# Patient Record
Sex: Male | Born: 1960 | Race: Black or African American | Hispanic: No | Marital: Single | Smoking: Current every day smoker
Health system: Southern US, Community
[De-identification: ages and names within clinical notes are randomized; demographics above are authoritative.]

## PROBLEM LIST (undated history)

## (undated) DIAGNOSIS — J449 Chronic obstructive pulmonary disease, unspecified: Secondary | ICD-10-CM

## (undated) DIAGNOSIS — F17200 Nicotine dependence, unspecified, uncomplicated: Secondary | ICD-10-CM

## (undated) DIAGNOSIS — W5501XA Bitten by cat, initial encounter: Secondary | ICD-10-CM

## (undated) DIAGNOSIS — S61452A Open bite of left hand, initial encounter: Secondary | ICD-10-CM

## (undated) HISTORY — PX: APPENDECTOMY: SHX54

---

## 2007-04-05 ENCOUNTER — Emergency Department (HOSPITAL_COMMUNITY): Admission: EM | Admit: 2007-04-05 | Discharge: 2007-04-05 | Payer: Self-pay | Admitting: Emergency Medicine

## 2014-04-16 ENCOUNTER — Encounter (HOSPITAL_COMMUNITY): Payer: Self-pay | Admitting: Family Medicine

## 2014-04-16 ENCOUNTER — Emergency Department (HOSPITAL_COMMUNITY)
Admission: EM | Admit: 2014-04-16 | Discharge: 2014-04-16 | Discharge status: Absent without leave | Payer: Self-pay | Source: Home / Self Care

## 2014-04-16 ENCOUNTER — Inpatient Hospital Stay (HOSPITAL_COMMUNITY)
Admission: EM | Admit: 2014-04-16 | Discharge: 2014-04-19 | DRG: 603 | Disposition: A | Discharge status: Home / Self Care | Payer: Self-pay | Attending: Oncology | Admitting: Oncology

## 2014-04-16 ENCOUNTER — Emergency Department (HOSPITAL_COMMUNITY): Payer: Self-pay

## 2014-04-16 DIAGNOSIS — I10 Essential (primary) hypertension: Secondary | ICD-10-CM | POA: Diagnosis present

## 2014-04-16 DIAGNOSIS — L03113 Cellulitis of right upper limb: Principal | ICD-10-CM | POA: Diagnosis present

## 2014-04-16 DIAGNOSIS — Z87891 Personal history of nicotine dependence: Secondary | ICD-10-CM

## 2014-04-16 DIAGNOSIS — S61452A Open bite of left hand, initial encounter: Secondary | ICD-10-CM | POA: Insufficient documentation

## 2014-04-16 DIAGNOSIS — I891 Lymphangitis: Secondary | ICD-10-CM

## 2014-04-16 DIAGNOSIS — W5501XA Bitten by cat, initial encounter: Secondary | ICD-10-CM | POA: Diagnosis present

## 2014-04-16 LAB — COMPREHENSIVE METABOLIC PANEL
ALK PHOS: 64 U/L (ref 39–117)
ALT: 13 U/L (ref 0–53)
AST: 16 U/L (ref 0–37)
Albumin: 4.3 g/dL (ref 3.5–5.2)
Anion gap: 7 (ref 5–15)
BUN: 16 mg/dL (ref 6–23)
CO2: 26 mmol/L (ref 19–32)
Calcium: 9.6 mg/dL (ref 8.4–10.5)
Chloride: 104 mmol/L (ref 96–112)
Creatinine, Ser: 1.1 mg/dL (ref 0.50–1.35)
GFR calc non Af Amer: 73 mL/min — ABNORMAL LOW (ref >90)
GFR, EST AFRICAN AMERICAN: 85 mL/min — AB (ref >90)
GLUCOSE: 101 mg/dL — AB (ref 70–99)
Potassium: 4.2 mmol/L (ref 3.5–5.1)
Sodium: 137 mmol/L (ref 135–145)
Total Bilirubin: 1 mg/dL (ref 0.3–1.2)
Total Protein: 7.3 g/dL (ref 6.0–8.3)

## 2014-04-16 LAB — CBC WITH DIFFERENTIAL/PLATELET
BASOS PCT: 0 % (ref 0–1)
Basophils Absolute: 0 10*3/uL (ref 0.0–0.1)
EOS ABS: 0.4 10*3/uL (ref 0.0–0.7)
Eosinophils Relative: 3 % (ref 0–5)
HCT: 39.6 % (ref 39.0–52.0)
Hemoglobin: 13.4 g/dL (ref 13.0–17.0)
Lymphocytes Relative: 11 % — ABNORMAL LOW (ref 12–46)
Lymphs Abs: 1.5 10*3/uL (ref 0.7–4.0)
MCH: 26.6 pg (ref 26.0–34.0)
MCHC: 33.8 g/dL (ref 30.0–36.0)
MCV: 78.6 fL (ref 78.0–100.0)
Monocytes Absolute: 0.8 10*3/uL (ref 0.1–1.0)
Monocytes Relative: 6 % (ref 3–12)
NEUTROS ABS: 10.5 10*3/uL — AB (ref 1.7–7.7)
NEUTROS PCT: 80 % — AB (ref 43–77)
PLATELETS: 172 10*3/uL (ref 150–400)
RBC: 5.04 MIL/uL (ref 4.22–5.81)
RDW: 13.5 % (ref 11.5–15.5)
WBC: 13.2 10*3/uL — ABNORMAL HIGH (ref 4.0–10.5)

## 2014-04-16 LAB — I-STAT CG4 LACTIC ACID, ED
Lactic Acid, Venous: 0.89 mmol/L (ref 0.5–2.0)
Lactic Acid, Venous: 1.65 mmol/L (ref 0.5–2.0)

## 2014-04-16 MED ORDER — ENOXAPARIN SODIUM 40 MG/0.4ML ~~LOC~~ SOLN
40.0000 mg | SUBCUTANEOUS | Status: DC
  Administered 2014-04-16 – 2014-04-18 (×3): 40 mg via SUBCUTANEOUS
  Filled 2014-04-16 (×4): qty 0.4

## 2014-04-16 MED ORDER — SODIUM CHLORIDE 0.9 % IV SOLN
3.0000 g | Freq: Four times a day (QID) | INTRAVENOUS | Status: DC
  Administered 2014-04-16 – 2014-04-19 (×12): 3 g via INTRAVENOUS
  Filled 2014-04-16 (×16): qty 3

## 2014-04-16 MED ORDER — HYDROCODONE-ACETAMINOPHEN 5-325 MG PO TABS
1.0000 | ORAL_TABLET | ORAL | Status: DC | PRN
  Administered 2014-04-16 – 2014-04-17 (×3): 2 via ORAL
  Administered 2014-04-18: 1 via ORAL
  Filled 2014-04-16 (×2): qty 2
  Filled 2014-04-16: qty 1
  Filled 2014-04-16: qty 2

## 2014-04-16 MED ORDER — IPRATROPIUM-ALBUTEROL 0.5-2.5 (3) MG/3ML IN SOLN: 3.0000 mL | Freq: Four times a day (QID) | RESPIRATORY_TRACT | Status: DC | PRN

## 2014-04-16 MED ORDER — TETANUS-DIPHTH-ACELL PERTUSSIS 5-2.5-18.5 LF-MCG/0.5 IM SUSP
0.5000 mL | Freq: Once | INTRAMUSCULAR | Status: AC
  Administered 2014-04-16: 0.5 mL via INTRAMUSCULAR
  Filled 2014-04-16: qty 0.5

## 2014-04-16 MED ORDER — VANCOMYCIN HCL 10 G IV SOLR
1250.0000 mg | Freq: Three times a day (TID) | INTRAVENOUS | Status: DC
  Administered 2014-04-16: 1250 mg via INTRAVENOUS
  Filled 2014-04-16 (×3): qty 1250

## 2014-04-16 MED ORDER — TETANUS IMMUNE GLOBULIN 250 UNIT/ML IM INJ
250.0000 [IU] | INJECTION | Freq: Once | INTRAMUSCULAR | Status: AC
  Administered 2014-04-16: 250 [IU] via INTRAMUSCULAR
  Filled 2014-04-16: qty 1

## 2014-04-16 NOTE — ED Provider Notes (Signed)
CSN: [id number]     Arrival date & time 04/16/14  1226 History   First MD Initiated Contact with Patient 04/16/14 1353     Chief Complaint  Patient presents with  . Animal Bite    HPI   54 year old male presents today after cat bite yesterday morning. 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 He reports the cat had been acting normal up until that point when it bit him on the right wrist . Cat is under observation at this point; unknown vaccination status of the cat. He reports today he woke up with redness swelling of the right wrist spreading up his forearm. Notes streaking up to his elbow. He denies an open wound, denies dizziness, shortness of breath, weakness, shoulder and elbow pain. Reports swelling and tenderness to the area of redness. No decreased strength and sensation or function of his hand. Patient denies any chronic health condition, reports that he does not have a primary care provider and has not seen one recently. Really not taking any medications. Denies fever. Patient reports that he currently lives at rehabilitation facility for alcohol and drug abuse, but he denies IV drug use. No current alcohol or drug abuse at this time.   History reviewed. No pertinent past medical history. History reviewed. No pertinent past surgical history. History reviewed. No pertinent family history. History  Substance Use Topics  . Smoking status: Former Research scientist (life sciences)  . Smokeless tobacco: Not on file  . Alcohol Use: No    Review of Systems  All other systems reviewed and are negative.   Allergies  Review of patient's allergies indicates no known allergies.  Home Medications   Prior to Admission medications   Not on File   BP 171/100 mmHg  Pulse 120  Temp(Src) 98.8 F (37.1 C) (Oral)  Resp 18  Ht 5\' 8"  (1.727 m)  Wt 185 lb 6 oz (84.086 kg)  BMI 28.19 kg/m2  SpO2 95% Physical Exam  Constitutional: He is oriented to person, place, and time. He appears  well-developed and well-nourished.  HENT:  Head: Normocephalic and atraumatic.  Eyes: Pupils are equal, round, and reactive to light.  Neck: Normal range of motion. Neck supple. No JVD present. No tracheal deviation present. No thyromegaly present.  Cardiovascular: Normal rate, regular rhythm, normal heart sounds and intact distal pulses.  Exam reveals no gallop and no friction rub.   No murmur heard. Pulmonary/Chest: Effort normal and breath sounds normal. No stridor. No respiratory distress. He has no wheezes. He has no rales. He exhibits no tenderness.  Musculoskeletal: Normal range of motion.  Erythema, swelling, tenderness to the proximal hand and distal wrist circumferentially. Streaking extending to the elbow.   Lymphadenopathy:    He has no cervical adenopathy.  Neurological: He is alert and oriented to person, place, and time. Coordination normal.  Skin: Skin is warm and dry.  Psychiatric: He has a normal mood and affect. His behavior is normal. Judgment and thought content normal.  Nursing note and vitals reviewed.   ED Course  Procedures (including critical care time) Labs Review Labs Reviewed  CBC WITH DIFFERENTIAL/PLATELET - Abnormal; Notable for the following:    WBC 13.2 (*)    Neutrophils Relative % 80 (*)    Neutro Abs 10.5 (*)    Lymphocytes Relative 11 (*)    All other components within normal limits  COMPREHENSIVE METABOLIC PANEL - Abnormal; Notable for the following:    Glucose, Bld 101 (*)  GFR calc non Af Amer 73 (*)    GFR calc Af Amer 85 (*)    All other components within normal limits    Imaging Review No results found.   EKG Interpretation None     MDM   Final diagnoses:  Lymphangitis  Cat bite, initial encounter    Labs: CBC 13.2 WBC, CMP glucose 101,   Imaging: Dg wrist right no radiopaque foreign body   Consults: Teaching service  Therapeutics: Unasyn 3G, Vancocin 1,250 mg, T dap  Assessment: cellulitis    Plan: Patient's Bite  is obviously infected with rapid progression of his arm as evidenced by streaking to his elbow and hand. Patient's history significant for drug and alcohol abuse he currently lives at a rehabilitation center and has no outpatient primary care to follow up with. Concerns for rapidly progressing infection were high with evidence of lymphangitis, teaching service was consult and agreed to admit patient. Patient's vital signs remained normal throughout stay. CBG was read at 101 making the concern for uncontrolled diabetes unlikely. Internal medicine teaching services consultation and agreed to admit patient.      Okey Regal, PA-C 04/16/14 1801  Ezequiel Essex, MD 04/16/14 6659

## 2014-04-16 NOTE — ED Notes (Signed)
Pt sts he was cleaning a cat at the [employer] yesterday and it bit him. Pt has bite to right wrist area. Area red, swollen and streaking up right arm.

## 2014-04-16 NOTE — ED Notes (Signed)
Admitting Doctor at bedside 

## 2014-04-16 NOTE — ED Notes (Signed)
Report given to 5W nurse

## 2014-04-16 NOTE — H&P (Signed)
Date: 04/16/2014               Patient Name:  Christian Newton MRN: [id number]  DOB: 03/17/1960 Age / Sex: 54 y.o., male   PCP: No primary care provider on file.         Medical Service: Internal Medicine Teaching Service         Attending Physician: Dr. Oval Linsey, MD    First Contact: Dr. Marvel Plan Pager: [phone]  Second Contact: Dr. Denton Brick Pager: [phone]       After Hours (After 5p/  First Contact Pager: [phone]  weekends / holidays): Second Contact Pager: [phone]   Chief Complaint: hand swelling and pain  History of Present Illness: Christian Newton is a 54 yo male with PMHx of alcohol abuse who presents to the ED with complaint of right handed redness, swelling and pain. Patient states he works at an [employer] and yesterday he was cleaning a cat's cage when the cat bit his right medial wrist. Last night, patient noticed swelling and some pain, but washed the wound and went to sleep. The following morning, patient noticed increased swelling, increased tenderness, and extension of redness from wrist and hand up his right arm. Patient went to work and upon showing his co-workers, they instructed him to go the ED. Patient admits to pain over the medial right wrist, stiffness in wrist and fingers, and swelling. He denies fevers, chills, nausea, vomiting or diarrhea. He denies confusion or dizziness. Patient states the cat was detained for observation as its vaccination status is unknown. Patient has never had a tetanus shot.   Patient lives at rehab facility and admits to tobacco use 3/4 ppd but denies alcohol or illicit drug use. He denies any PMHx and states he only uses advil and tylenol at home.  Meds: Prescriptions prior to admission  Medication Sig Dispense Refill Last Dose  . acetaminophen (TYLENOL) 500 MG tablet Take 500 mg by mouth every 6 (six) hours as needed.   04/16/2014 at Unknown time  . ibuprofen (ADVIL,MOTRIN) 200 MG tablet Take 200 mg by mouth every 6 (six) hours as  needed.   04/15/2014 at Unknown time    Current Facility-Administered Medications  Medication Dose Route Frequency Provider Last Rate Last Dose  . Ampicillin-Sulbactam (UNASYN) 3 g in sodium chloride 0.9 % 100 mL IVPB  3 g Intravenous Q6H Okey Regal, PA-C   Stopped at 04/16/14 1650  . enoxaparin (LOVENOX) injection 40 mg  40 mg Subcutaneous Q24H Ejiroghene E Emokpae, MD      . HYDROcodone-acetaminophen (NORCO/VICODIN) 5-325 MG per tablet 1-2 tablet  1-2 tablet Oral Q4H PRN Bethena Roys, MD   2 tablet at 04/16/14 1914  . ipratropium-albuterol (DUONEB) 0.5-2.5 (3) MG/3ML nebulizer solution 3 mL  3 mL Nebulization Q6H PRN Jaymen Fetch Sherral Hammers, MD        Allergies: Allergies as of 04/16/2014  . (No Known Allergies)   History reviewed. No pertinent past medical history. History reviewed. No pertinent past surgical history. History reviewed. No pertinent family history. History   Social History  . Marital Status: Single    Spouse Name: N/A  . Number of Children: N/A  . Years of Education: N/A   Occupational History  . Not on file.   Social History Main Topics  . Smoking status: Former Research scientist (life sciences)  . Smokeless tobacco: Not on file  . Alcohol Use: No  . Drug Use: No  . Sexual Activity: Not on file   Other Topics  Concern  . Not on file   Social History Narrative  . No narrative on file    Review of Systems: General: Denies fever, chills  Respiratory: Admits to wheeze. Denies SOB, cough, DOE, chest tightness  Cardiovascular: Denies chest pain and palpitations.  Gastrointestinal: Denies nausea, vomiting, abdominal pain, diarrhea, constipation  Musculoskeletal: Admits to right wrist pain, swelling and redness. Stiffness of fingers and wrist. Neurological: Denies dizziness, headaches, weakness, lightheadedness, numbness  Physical Exam: Filed Vitals:   04/16/14 1430 04/16/14 1615 04/16/14 1745 04/16/14 1840  BP: 160/84 158/96 172/102 157/104  Pulse: 101 102 102 118    Temp:   100.4 F (38 C) 100.3 F (37.9 C)  TempSrc:   Oral Oral  Resp: 16 16 18 18   Height:    5\' 8"  (1.727 m)  Weight:    185 lb 6 oz (84.086 kg)  SpO2: 98% 98% 97% 97%   General: Vital signs reviewed.  Patient is well-developed and well-nourished, in no acute distress and cooperative with exam.  Cardiovascular: Tachycardic, regular rhythm, S1 normal, S2 normal, no murmurs, gallops, or rubs. Pulmonary/Chest: Scattered expiratory wheezes, no rhonchi or rales. Abdominal: Soft, non-tender, distended, BS +, no masses, organomegaly, or guarding present.  Extremities: No lower extremity edema bilaterally. Skin: Erythematous, edematous, and tender right medial wrist with extension of edema and erythema extending to hand and proximally up right arm. Stiffness in fingers, but normal strength, normal pulses and sensation Psychiatric: Normal mood and affect. speech and behavior is normal. Cognition and memory are normal.         Lab results: Basic Metabolic Panel:  Recent Labs  04/16/14 1259  NA 137  K 4.2  CL 104  CO2 26  GLUCOSE 101*  BUN 16  CREATININE 1.10  CALCIUM 9.6   Liver Function Tests:  Recent Labs  04/16/14 1259  AST 16  ALT 13  ALKPHOS 64  BILITOT 1.0  PROT 7.3  ALBUMIN 4.3   CBC:  Recent Labs  04/16/14 1259  WBC 13.2*  NEUTROABS 10.5*  HGB 13.4  HCT 39.6  MCV 78.6  PLT 172   Imaging results:  Dg Wrist Complete Right  04/16/2014   CLINICAL DATA:  Pain following cat bite 1 day prior  EXAM: RIGHT WRIST - COMPLETE 3+ VIEW  COMPARISON:  None.  FINDINGS: Frontal, oblique, ulnar deviation scaphoid, and lateral views were obtained. There is no fracture or dislocation. No erosive change or bony destruction. There are subchondral cysts in the distal scaphoid. No erosive change. No radiopaque foreign body or soft tissue abscess appreciable.  IMPRESSION: Small subchondral cysts in distal scaphoid. No fracture or dislocation. No erosive change or bony  destruction. No radiopaque foreign body or soft tissue abscess appreciable.   Electronically Signed   By: Lowella Grip III M.D.   On: 04/16/2014 14:45   Assessment & Plan by Problem: Principal Problem:   Cellulitis  RUE Cellulitis: Secondary to cat bite that occurred the day prior to admission. Vitals signs on admission showed the patient was afebrile at 98.8, hypertensive at 171/100, tachycardic at 120, RR 18, 95% on room air. Labs revealed leukocytosis of 13.2 and normal 0.89. Wrist xray showed no fracture or dislocation, no erosive change or bony destruction, and no radiopaque foreign body or soft tissue abscess appreciable. Given limited social support and rapid spread of infection, patient will be admitted for IV antibiotics. We are covering for anaerobic, aerobic, staph and strep. Most likely pathogens include pasteurella, bacteroides, fusobacteria. We will  treat with Unasyn. We will also monitor for signs of lymphangitis, osteomyelitis, tenosynovitis, and septic arthritis. Patient will need treatment for 5-10 days with consideration of transitioning to Augmentin 875/125 tomorrow.  -Unasyn 3 grams IV Q6H  -Norco 5-325 mg 1-2 tabs Q4H prn -Tdap -Tetanus Ig -CBC tomorrow am -Heart healthy diet -HgbA1c  -HIV antibody  Wheeze: Scattered expiratory wheezes on exam. Patient denies history of asthma. Denies shortness of breath. -Duoneb Q6H prn  DVT/PE ppx: Lovenox SQ QD  FEN: Heart Healthy  CODE: FULL  Dispo: Disposition is deferred at this time, awaiting improvement of current medical problems. Anticipated discharge in approximately 1-2 day(s).   The patient does not have a current PCP (No primary care provider on file.) and does need an [facility] hospital follow-up appointment after discharge.  The patient does not have transportation limitations that hinder transportation to clinic appointments.  Signed: Osa Craver, DO PGY-1 Internal Medicine Resident Pager #  [phone] 04/16/2014 7:33 PM

## 2014-04-16 NOTE — Progress Notes (Signed)
Pt admitted to the unit at [facility]. Pt mental status is A&OX4. Pt oriented to room, staff, and call bell. Skin is intact other than cellulitis to R wrist and scab to L shin. Full assessment charted in [facility]. Call bell within reach. Visitor guidelines reviewed w/ pt and/or family.

## 2014-04-17 DIAGNOSIS — L03113 Cellulitis of right upper limb: Principal | ICD-10-CM

## 2014-04-17 DIAGNOSIS — W5501XA Bitten by cat, initial encounter: Secondary | ICD-10-CM

## 2014-04-17 LAB — CBC
HCT: 39.9 % (ref 39.0–52.0)
HEMOGLOBIN: 13.3 g/dL (ref 13.0–17.0)
MCH: 26.4 pg (ref 26.0–34.0)
MCHC: 33.3 g/dL (ref 30.0–36.0)
MCV: 79.2 fL (ref 78.0–100.0)
Platelets: 169 10*3/uL (ref 150–400)
RBC: 5.04 MIL/uL (ref 4.22–5.81)
RDW: 13.7 % (ref 11.5–15.5)
WBC: 11.1 10*3/uL — ABNORMAL HIGH (ref 4.0–10.5)

## 2014-04-17 LAB — HIV ANTIBODY (ROUTINE TESTING W REFLEX): HIV Screen 4th Generation wRfx: NONREACTIVE

## 2014-04-17 MED ORDER — VANCOMYCIN HCL IN DEXTROSE 1-5 GM/200ML-% IV SOLN
1000.0000 mg | Freq: Two times a day (BID) | INTRAVENOUS | Status: DC
  Administered 2014-04-17 – 2014-04-19 (×5): 1000 mg via INTRAVENOUS
  Filled 2014-04-17 (×6): qty 200

## 2014-04-17 NOTE — Progress Notes (Signed)
Subjective:  Patient was seen and examined this morning. He continues to have right wrist swelling and tenderness. He denies any associated symptoms of fever, chills, nausea or vomiting.   Objective: Vital signs in last 24 hours: Filed Vitals:   04/16/14 1840 04/16/14 1930 04/16/14 2152 04/17/14 0502  BP: 157/104 148/84 129/86 140/89  Pulse: 118  110 104  Temp: 100.3 F (37.9 C)  99.3 F (37.4 C) 99.2 F (37.3 C)  TempSrc: Oral  Oral Oral  Resp: 18  20 16   Height: 5\' 8"  (1.727 m)     Weight: 185 lb 6 oz (84.086 kg)     SpO2: 97%  94% 94%   Weight change:   Intake/Output Summary (Last 24 hours) at 04/17/14 1146 Last data filed at 04/17/14 0918  Gross per 24 hour  Intake    560 ml  Output    450 ml  Net    110 ml   General: Vital signs reviewed. Patient is well-developed and well-nourished, in no acute distress and cooperative with exam.  Cardiovascular: Tachycardic, regular rhythm, S1 normal, S2 normal, no murmurs, gallops, or rubs. Pulmonary/Chest: CTA b/l, no WRR auscultated on lung exam Abdominal: Soft, non-tender, distended, BS +, no masses, organomegaly, or guarding present.  Extremities: No lower extremity edema bilaterally. Skin: Erythematous, edematous, and tender right medial wrist with 2 cm extension of edema and erythema proximally beyond marker. Normal strength, normal pulses and sensation. Psychiatric: Normal mood and affect. speech and behavior is normal. Cognition and memory are normal.   Lab Results: Basic Metabolic Panel:  Recent Labs Lab 04/16/14 1259  NA 137  K 4.2  CL 104  CO2 26  GLUCOSE 101*  BUN 16  CREATININE 1.10  CALCIUM 9.6   Liver Function Tests:  Recent Labs Lab 04/16/14 1259  AST 16  ALT 13  ALKPHOS 64  BILITOT 1.0  PROT 7.3  ALBUMIN 4.3   CBC:  Recent Labs Lab 04/16/14 1259 04/17/14 0508  WBC 13.2* 11.1*  NEUTROABS 10.5*  --   HGB 13.4 13.3  HCT 39.6 39.9  MCV 78.6 79.2  PLT 172 169   Micro  Results: Recent Results (from the past 240 hour(s))  Culture, blood (routine x 2)     Status: None (Preliminary result)   Collection Time: 04/16/14  2:55 PM  Result Value Ref Range Status   Specimen Description BLOOD LEFT ANTECUBITAL  Final   Special Requests BOTTLES DRAWN AEROBIC AND ANAEROBIC 5CCS  Final   Culture   Final           BLOOD CULTURE RECEIVED NO GROWTH TO DATE CULTURE WILL BE HELD FOR 5 DAYS BEFORE ISSUING A FINAL NEGATIVE REPORT Performed at [facility]    Report Status PENDING  Incomplete  Culture, blood (routine x 2)     Status: None (Preliminary result)   Collection Time: 04/16/14  3:05 PM  Result Value Ref Range Status   Specimen Description BLOOD LEFT HAND  Final   Special Requests BOTTLES DRAWN AEROBIC AND ANAEROBIC 5CCS  Final   Culture   Final           BLOOD CULTURE RECEIVED NO GROWTH TO DATE CULTURE WILL BE HELD FOR 5 DAYS BEFORE ISSUING A FINAL NEGATIVE REPORT Performed at [facility]    Report Status PENDING  Incomplete   Studies/Results: Dg Wrist Complete Right  04/16/2014   CLINICAL DATA:  Pain following cat bite 1 day prior  EXAM: RIGHT WRIST -  COMPLETE 3+ VIEW  COMPARISON:  None.  FINDINGS: Frontal, oblique, ulnar deviation scaphoid, and lateral views were obtained. There is no fracture or dislocation. No erosive change or bony destruction. There are subchondral cysts in the distal scaphoid. No erosive change. No radiopaque foreign body or soft tissue abscess appreciable.  IMPRESSION: Small subchondral cysts in distal scaphoid. No fracture or dislocation. No erosive change or bony destruction. No radiopaque foreign body or soft tissue abscess appreciable.   Electronically Signed   By: Lowella Grip III M.D.   On: 04/16/2014 14:45   Medications:  I have reviewed the patient's current medications. Prior to Admission:  Prescriptions prior to admission  Medication Sig Dispense Refill Last Dose  . acetaminophen (TYLENOL) 500 MG tablet  Take 500 mg by mouth every 6 (six) hours as needed.   04/16/2014 at Unknown time  . ibuprofen (ADVIL,MOTRIN) 200 MG tablet Take 200 mg by mouth every 6 (six) hours as needed.   04/15/2014 at Unknown time   Scheduled Meds: . ampicillin-sulbactam (UNASYN) IV  3 g Intravenous Q6H  . enoxaparin (LOVENOX) injection  40 mg Subcutaneous Q24H  . vancomycin  1,000 mg Intravenous Q12H   Continuous Infusions:  PRN Meds:.HYDROcodone-acetaminophen, ipratropium-albuterol Assessment/Plan: Principal Problem:   Cellulitis  RUE Cellulitis: Secondary to cat bite that occurred the day prior to admission. Patient continues to have erythema, edema, and tenderness with proximal extension of erythema 2 cm beyond marked border last night. Patient remains afebrile, leukocytosis 13.2>11, and lactic acid remains normal at 0.89>1.65. We will continue to treat with Unasyn and add Vancomycin.  -Unasyn 3 grams IV Q6H  -Vancomycin 1000 mg IV BID -Consider transitioning to Augmentin 875/125 on discharge for a total of 5-10 days of antibiotics -Norco 5-325 mg 1-2 tabs Q4H prn -CBC tomorrow am -Heart healthy diet -HgbA1c pending -HIV pending  DVT/PE ppx: Lovenox SQ QD  FEN: Heart Healthy  CODE: FULL  .Services Needed at time of discharge: Y = Yes, Blank = No PT:   OT:   RN:   Equipment:   Other:     LOS: 1 day   Osa Craver, DO PGY-1 Internal Medicine Resident Pager # [phone] 04/17/2014 11:46 AM

## 2014-04-17 NOTE — Progress Notes (Signed)
ANTIBIOTIC CONSULT NOTE - INITIAL  Pharmacy Consult for Vancomycin  Indication: Cellulits secondary to cat bite  No Known Allergies  Patient Measurements: Height: 5\' 8"  (172.7 cm) Weight: 185 lb 6 oz (84.086 kg) IBW/kg (Calculated) : 68.4  Vital Signs: Temp: 99.2 F (37.3 C) (03/26 0502) Temp Source: Oral (03/26 0502) BP: 140/89 mmHg (03/26 0502) Pulse Rate: 104 (03/26 0502) Intake/Output from previous day: 03/25 0701 - 03/26 0700 In: 440 [P.O.:240; IV Piggyback:200] Out: 450 [Urine:450] Intake/Output from this shift: Total I/O In: 120 [P.O.:120] Out: 0   Labs:  Recent Labs  04/16/14 1259 04/17/14 0508  WBC 13.2* 11.1*  HGB 13.4 13.3  PLT 172 169  CREATININE 1.10  --    Estimated Creatinine Clearance: 79.2 mL/min (by C-G formula based on Cr of 1.1). No results for input(s): VANCOTROUGH, VANCOPEAK, VANCORANDOM, GENTTROUGH, GENTPEAK, GENTRANDOM, TOBRATROUGH, TOBRAPEAK, TOBRARND, AMIKACINPEAK, AMIKACINTROU, AMIKACIN in the last 72 hours.   Microbiology: Recent Results (from the past 720 hour(s))  Culture, blood (routine x 2)     Status: None (Preliminary result)   Collection Time: 04/16/14  2:55 PM  Result Value Ref Range Status   Specimen Description BLOOD LEFT ANTECUBITAL  Final   Special Requests BOTTLES DRAWN AEROBIC AND ANAEROBIC 5CCS  Final   Culture   Final           BLOOD CULTURE RECEIVED NO GROWTH TO DATE CULTURE WILL BE HELD FOR 5 DAYS BEFORE ISSUING A FINAL NEGATIVE REPORT Performed at [facility]    Report Status PENDING  Incomplete  Culture, blood (routine x 2)     Status: None (Preliminary result)   Collection Time: 04/16/14  3:05 PM  Result Value Ref Range Status   Specimen Description BLOOD LEFT HAND  Final   Special Requests BOTTLES DRAWN AEROBIC AND ANAEROBIC 5CCS  Final   Culture   Final           BLOOD CULTURE RECEIVED NO GROWTH TO DATE CULTURE WILL BE HELD FOR 5 DAYS BEFORE ISSUING A FINAL NEGATIVE REPORT Performed at [facility]    Report Status PENDING  Incomplete    Medical History: History reviewed. No pertinent past medical history.  Medications:  Prescriptions prior to admission  Medication Sig Dispense Refill Last Dose  . acetaminophen (TYLENOL) 500 MG tablet Take 500 mg by mouth every 6 (six) hours as needed.   04/16/2014 at Unknown time  . ibuprofen (ADVIL,MOTRIN) 200 MG tablet Take 200 mg by mouth every 6 (six) hours as needed.   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  Admitted 04/16/2014  Due to swelling, increased tenderness, and redness extending up right arm.  Pharmacy consulted to dose vancomycin.  ID: cellulitis s/p cat bite, wbc remains elevated but trend down, afeb, tachycardic Unasyn 3/25>> Vanc 3/26>>  Goal of Therapy:  Vancomycin trough level 10-15 mcg/ml  Plan:  Vancomycin 1g IV q12h Follow up SCr, UOP, cultures, clinical course and adjust as clinically indicated.   Thank you for allowing pharmacy to be a part of this patients care team.  Rowe Robert Pharm.D., BCPS, AQ-Cardiology Clinical Pharmacist 04/17/2014 9:22 AM Pager: [phone] Phone: [phone]

## 2014-04-17 NOTE — Progress Notes (Signed)
UR completed 

## 2014-04-18 LAB — CBC
HEMATOCRIT: 39 % (ref 39.0–52.0)
Hemoglobin: 13.3 g/dL (ref 13.0–17.0)
MCH: 26.8 pg (ref 26.0–34.0)
MCHC: 34.1 g/dL (ref 30.0–36.0)
MCV: 78.5 fL (ref 78.0–100.0)
Platelets: 162 10*3/uL (ref 150–400)
RBC: 4.97 MIL/uL (ref 4.22–5.81)
RDW: 13.5 % (ref 11.5–15.5)
WBC: 9.1 10*3/uL (ref 4.0–10.5)

## 2014-04-18 LAB — GLUCOSE, CAPILLARY: GLUCOSE-CAPILLARY: 120 mg/dL — AB (ref 70–99)

## 2014-04-18 NOTE — Progress Notes (Addendum)
Subjective: Patient says the swelling is about the same today as he was yesterday, pain is about the same. Denies fever or chills. No SOB, no chest pain.  Objective: Vital signs in last 24 hours: Filed Vitals:   04/17/14 0502 04/17/14 1408 04/17/14 2244 04/18/14 0540  BP: 140/89 151/93 138/82 144/85  Pulse: 104 100 100 101  Temp: 99.2 F (37.3 C) 99.6 F (37.6 C) 99.3 F (37.4 C) 98.7 F (37.1 C)  TempSrc: Oral Oral Oral Oral  Resp: 16 20 18 18   Height:      Weight:      SpO2: 94% 96% 95% 93%   Weight change:   Intake/Output Summary (Last 24 hours) at 04/18/14 1035 Last data filed at 04/18/14 0919  Gross per 24 hour  Intake   1640 ml  Output   1040 ml  Net    600 ml   General: Vital signs reviewed. Patient is well-developed and well-nourished, in no acute distress and cooperative with exam.  Cardiovascular: Tachycardic, regular rhythm, S1 normal, S2 normal, no murmurs, gallops, or rubs. Pulmonary/Chest: No added sounds Abdominal: Soft, non-tender, distended, BS +, no masses, organomegaly, or guarding present.  Extremities: No lower extremity edema bilaterally. Skin: Swelling has improved, but here is still slight extension of redness beyound marker. Normal strength, normal pulses and sensation, range of motion of wrist- right slightly limited by pain.  Psychiatric: Normal mood and affect. speech and behavior is normal. Cognition and memory are normal.   Lab Results: Basic Metabolic Panel:  Recent Labs Lab 04/16/14 1259  NA 137  K 4.2  CL 104  CO2 26  GLUCOSE 101*  BUN 16  CREATININE 1.10  CALCIUM 9.6   Liver Function Tests:  Recent Labs Lab 04/16/14 1259  AST 16  ALT 13  ALKPHOS 64  BILITOT 1.0  PROT 7.3  ALBUMIN 4.3   CBC:  Recent Labs Lab 04/16/14 1259 04/17/14 0508 04/18/14 0800  WBC 13.2* 11.1* 9.1  NEUTROABS 10.5*  --   --   HGB 13.4 13.3 13.3  HCT 39.6 39.9 39.0  MCV 78.6 79.2 78.5  PLT 172 169 162   Micro Results: Recent  Results (from the past 240 hour(s))  Culture, blood (routine x 2)     Status: None (Preliminary result)   Collection Time: 04/16/14  2:55 PM  Result Value Ref Range Status   Specimen Description BLOOD LEFT ANTECUBITAL  Final   Special Requests BOTTLES DRAWN AEROBIC AND ANAEROBIC 5CCS  Final   Culture   Final           BLOOD CULTURE RECEIVED NO GROWTH TO DATE CULTURE WILL BE HELD FOR 5 DAYS BEFORE ISSUING A FINAL NEGATIVE REPORT Performed at [facility]    Report Status PENDING  Incomplete  Culture, blood (routine x 2)     Status: None (Preliminary result)   Collection Time: 04/16/14  3:05 PM  Result Value Ref Range Status   Specimen Description BLOOD LEFT HAND  Final   Special Requests BOTTLES DRAWN AEROBIC AND ANAEROBIC 5CCS  Final   Culture   Final           BLOOD CULTURE RECEIVED NO GROWTH TO DATE CULTURE WILL BE HELD FOR 5 DAYS BEFORE ISSUING A FINAL NEGATIVE REPORT Performed at [facility]    Report Status PENDING  Incomplete   Studies/Results: Dg Wrist Complete Right  04/16/2014   CLINICAL DATA:  Pain following cat bite 1 day prior  EXAM: RIGHT  WRIST - COMPLETE 3+ VIEW  COMPARISON:  None.  FINDINGS: Frontal, oblique, ulnar deviation scaphoid, and lateral views were obtained. There is no fracture or dislocation. No erosive change or bony destruction. There are subchondral cysts in the distal scaphoid. No erosive change. No radiopaque foreign body or soft tissue abscess appreciable.  IMPRESSION: Small subchondral cysts in distal scaphoid. No fracture or dislocation. No erosive change or bony destruction. No radiopaque foreign body or soft tissue abscess appreciable.   Electronically Signed   By: Lowella Grip III M.D.   On: 04/16/2014 14:45   Medications:  I have reviewed the patient's current medications. Prior to Admission:  Prescriptions prior to admission  Medication Sig Dispense Refill Last Dose  . acetaminophen (TYLENOL) 500 MG tablet Take 500 mg by  mouth every 6 (six) hours as needed.   04/16/2014 at Unknown time  . ibuprofen (ADVIL,MOTRIN) 200 MG tablet Take 200 mg by mouth every 6 (six) hours as needed.   04/15/2014 at Unknown time   Scheduled Meds: . ampicillin-sulbactam (UNASYN) IV  3 g Intravenous Q6H  . enoxaparin (LOVENOX) injection  40 mg Subcutaneous Q24H  . vancomycin  1,000 mg Intravenous Q12H   Continuous Infusions:  PRN Meds:.HYDROcodone-acetaminophen, ipratropium-albuterol Assessment/Plan: Principal Problem:   Cellulitis Active Problems:   Cat bite  RUE Cellulitis: Appears the same compared to yesterday. On Unasyn and Vanc. Secondary to cat bite that occurred the day prior to admission. Patient continues to have erythema, edema, and tenderness with proximal extension of erythema 2 cm beyond marked border last night. Patient remains afebrile, leukocytosis 13.2>11,> 9.1. Will continue to treat with Unasyn and add Vancomycin.  -Unasyn 3 grams IV Q6H  -Vancomycin 1000 mg IV BID- switch to doxycycline -Consider transitioning to Augmentin 875/125 on discharge for a total of 5-10 days of antibiotics -Norco 5-325 mg 1-2 tabs Q4H prn -Heart healthy diet -HgbA1c - pending -HIV - negative  DVT/PE ppx: Lovenox SQ QD  FEN: Heart Healthy  CODE: FULL  .Services Needed at time of discharge: Y = Yes, Blank = No PT:   OT:   RN:   Equipment:   Other:     LOS: 2 days   Bing Neighbors PGY-2 Internal Medicine Resident Pager # [phone] 04/18/2014 10:35 AM  Internal Medicine Attending  Date: 04/18/2014  Patient name: Christian Newton Medical record number: [id number] Date of birth: 29-Jun-1960 Age: 54 y.o. Gender: male  I discussed Christian Newton case with the housestaff and reviewed the resident's note by Dr. Denton Brick and I agree with the resident's findings and plans as documented in her progress note.  We will continue the IV Unasyn and vancomycin and reassess the cellulitis in the morning.

## 2014-04-19 LAB — HEMOGLOBIN A1C
Hgb A1c MFr Bld: 6.1 % — ABNORMAL HIGH (ref 4.8–5.6)
Mean Plasma Glucose: 128 mg/dL

## 2014-04-19 MED ORDER — SULFAMETHOXAZOLE-TRIMETHOPRIM 800-160 MG PO TABS: 1.0000 | ORAL_TABLET | Freq: Two times a day (BID) | ORAL | Status: DC

## 2014-04-19 MED ORDER — METRONIDAZOLE 500 MG PO TABS: 500.0000 mg | ORAL_TABLET | Freq: Three times a day (TID) | ORAL | Status: DC

## 2014-04-19 NOTE — Discharge Summary (Signed)
Name: Christian Newton MRN: [id number] DOB: Jun 02, 1960 54 y.o. PCP: No primary care provider on file.  Date of Admission: 04/16/2014  1:47 PM Date of Discharge: 04/19/2014 Attending Physician: No att. providers found  Discharge Diagnosis:  Principal Problem:   Cellulitis Active Problems:   Cat bite  Discharge Medications:   Medication List    TAKE these medications        acetaminophen 500 MG tablet  Commonly known as:  TYLENOL  Take 500 mg by mouth every 6 (six) hours as needed.     ibuprofen 200 MG tablet  Commonly known as:  ADVIL,MOTRIN  Take 200 mg by mouth every 6 (six) hours as needed.     metroNIDAZOLE 500 MG tablet  Commonly known as:  FLAGYL  Take 1 tablet (500 mg total) by mouth 3 (three) times daily.     sulfamethoxazole-trimethoprim 800-160 MG per tablet  Commonly known as:  SEPTRA DS  Take 1 tablet by mouth 2 (two) times daily.        Disposition and follow-up:   Mr.Christian Newton was discharged from [facility] in Good condition.  At the hospital follow up visit please address:  1.  Cellulitis secondary to Cat Bite: Please assess resolution of cellulitis and completion of antibiotics.   2.  Labs / imaging needed at time of follow-up: None  3.  Pending labs/ test needing follow-up: None  Follow-up Appointments: Follow-up Information    Follow up with Christian Groves, DO On 05/04/2014.   Specialty:  Internal Medicine   Why:  3:15 pm   Contact information:   [address] [place] [id number] [phone]       Discharge Instructions: Discharge Instructions    Diet - low sodium heart healthy    Complete by:  As directed      Increase activity slowly    Complete by:  As directed            Consultations:  None  Procedures Performed:  Dg Wrist Complete Right  04/16/2014   CLINICAL DATA:  Pain following cat bite 1 day prior  EXAM: RIGHT WRIST - COMPLETE 3+ VIEW  COMPARISON:  None.  FINDINGS: Frontal, oblique,  ulnar deviation scaphoid, and lateral views were obtained. There is no fracture or dislocation. No erosive change or bony destruction. There are subchondral cysts in the distal scaphoid. No erosive change. No radiopaque foreign body or soft tissue abscess appreciable.  IMPRESSION: Small subchondral cysts in distal scaphoid. No fracture or dislocation. No erosive change or bony destruction. No radiopaque foreign body or soft tissue abscess appreciable.   Electronically Signed   By: Lowella Grip III M.D.   On: 04/16/2014 14:45   Admission HPI: Mr. Christian Newton is a 54 yo male with PMHx of alcohol abuse who presents to the ED with complaint of right handed redness, swelling and pain. Patient states he works at an [employer] and yesterday he was cleaning a cat's cage when the cat bit his right medial wrist. Last night, patient noticed swelling and some pain, but washed the wound and went to sleep. The following morning, patient noticed increased swelling, increased tenderness, and extension of redness from wrist and hand up his right arm. Patient went to work and upon showing his co-workers, they instructed him to go the ED. Patient admits to pain over the medial right wrist, stiffness in wrist and fingers, and swelling. He denies fevers, chills, nausea, vomiting or diarrhea. He denies  confusion or dizziness. Patient states the cat was detained for observation as its vaccination status is unknown. Patient has never had a tetanus shot.   Patient lives at rehab facility and admits to tobacco use 3/4 ppd but denies alcohol or illicit drug use. He denies any PMHx and states he only uses advil and tylenol at home.  Hospital Course by problem list: Principal Problem:   Cellulitis Active Problems:   Cat bite   RUE Cellulitis: Secondary to cat bite that occurred the day prior to admission. Vitals signs on admission showed the patient was afebrile at 98.8, hypertensive at 171/100, tachycardic at 120, RR 18, 95% on  room air. Normal lactic acid at 0.89. Wrist xray showed no fracture or dislocation, no erosive change or bony destruction, and no radiopaque foreign body or soft tissue abscess appreciable. Given limited social support and rapid spread of infection, patient was admitted for IV antibiotics.Patient remained afebrile, leukocytosis 13.2>11>9.1. HIV non-reactive. Patient improved greatly on Unasyn and Vancomycin for 3 days. Patient was transitioned to Bactrim and Doxycycline on day 4 and on discharge for a total of 10 days of antibiotics.   Pre-diabetes: HgbA1c 6.1. Patient was counseled on diet and exercise.   Discharge Vitals:   BP 155/99 mmHg  Pulse 101  Temp(Src) 99.2 F (37.3 C) (Oral)  Resp 18  Ht 5\' 8"  (1.727 m)  Wt 185 lb 6 oz (84.086 kg)  BMI 28.19 kg/m2  SpO2 98%  Discharge Labs:  Results for orders placed or performed during the hospital encounter of 04/16/14 (from the past 24 hour(s))  Glucose, capillary     Status: Abnormal   Collection Time: 04/18/14  9:08 PM  Result Value Ref Range   Glucose-Capillary 120 (H) 70 - 99 mg/dL    Signed: Osa Craver, DO PGY-1 Internal Medicine Resident Pager # [phone] 04/19/2014 2:54 PM

## 2014-04-19 NOTE — Progress Notes (Signed)
Patient given discharge education and follow up apppointment visit.  [facility] to pick up patient and take to [facility] outpatient pharmacy to have medications filled through the match program. Patient understands all education and will follow up and take medications as ordered. Awaiting pickup from Rehab. Vitals WNL.

## 2014-04-19 NOTE — Progress Notes (Signed)
CARE MANAGEMENT NOTE 04/19/2014  Patient:  [facility]   Account Number:  [id number]  Date Initiated:  04/19/2014  Documentation initiated by:  [facility]  Subjective/Objective Assessment:   admited with cellulitis from a cat bite     Action/Plan:   from [place] to return to [facility]   Anticipated DC Date:  04/19/2014   Anticipated DC Plan:  HOME/SELF CARE  In-house referral  Clinical Social Worker      [place]  CM consult  [place] [facility]                Status of service:  Completed, signed off Medicare Important Message given?   (If response is "NO", the following Medicare IM given date fields will be blank) Date Medicare IM given:   Medicare IM given by:   Date Additional Medicare IM given:   Additional Medicare IM given by:    Discharge Disposition:  HOME/SELF CARE  Per UR Regulation:  Reviewed for med. necessity/level of care/duration of stay  If discussed at [place] of Stay Meetings, dates discussed:    Comments:  04/19/14 Patient given [facility] letter and list of pharmacies.

## 2014-04-19 NOTE — Progress Notes (Signed)
Subjective:  Patient was seen and examined this morning. Patient states the swelling and pain have improved. No other complaints. Wants to go home.   Objective: Vital signs in last 24 hours: Filed Vitals:   04/18/14 0540 04/18/14 1347 04/18/14 2111 04/19/14 0527  BP: 144/85 145/81 144/78 155/99  Pulse: 101  106 101  Temp: 98.7 F (37.1 C) 98.8 F (37.1 C) 99 F (37.2 C) 99.2 F (37.3 C)  TempSrc: Oral Oral Oral Oral  Resp: 18 16 18 18   Height:      Weight:      SpO2: 93% 95% 97% 98%   Weight change:   Intake/Output Summary (Last 24 hours) at 04/19/14 1038 Last data filed at 04/19/14 0938  Gross per 24 hour  Intake   1560 ml  Output    500 ml  Net   1060 ml   General: Vital signs reviewed. Patient is well-developed and well-nourished, in no acute distress and cooperative with exam.  Cardiovascular: Tachycardic, regular rhythm, S1 normal, S2 normal, no murmurs, gallops, or rubs. Pulmonary/Chest: CTA b/l, no WRR auscultated on lung exam Abdominal: Soft, non-tender, distended, BS +, no masses, organomegaly, or guarding present.  Extremities: No lower extremity edema bilaterally. Skin: Erythematous, edema, and tenderness have improved and retracted. Still some residual erythema and edema over right wrist, but greatly improved.  Normal strength, normal pulses and sensation. Psychiatric: Normal mood and affect. speech and behavior is normal. Cognition and memory are normal.   Lab Results: Basic Metabolic Panel:  Recent Labs Lab 04/16/14 1259  NA 137  K 4.2  CL 104  CO2 26  GLUCOSE 101*  BUN 16  CREATININE 1.10  CALCIUM 9.6   Liver Function Tests:  Recent Labs Lab 04/16/14 1259  AST 16  ALT 13  ALKPHOS 64  BILITOT 1.0  PROT 7.3  ALBUMIN 4.3   CBC:  Recent Labs Lab 04/16/14 1259 04/17/14 0508 04/18/14 0800  WBC 13.2* 11.1* 9.1  NEUTROABS 10.5*  --   --   HGB 13.4 13.3 13.3  HCT 39.6 39.9 39.0  MCV 78.6 79.2 78.5  PLT 172 169 162   Micro  Results: Recent Results (from the past 240 hour(s))  Culture, blood (routine x 2)     Status: None (Preliminary result)   Collection Time: 04/16/14  2:55 PM  Result Value Ref Range Status   Specimen Description BLOOD LEFT ANTECUBITAL  Final   Special Requests BOTTLES DRAWN AEROBIC AND ANAEROBIC 5CCS  Final   Culture   Final           BLOOD CULTURE RECEIVED NO GROWTH TO DATE CULTURE WILL BE HELD FOR 5 DAYS BEFORE ISSUING A FINAL NEGATIVE REPORT Performed at [facility]    Report Status PENDING  Incomplete  Culture, blood (routine x 2)     Status: None (Preliminary result)   Collection Time: 04/16/14  3:05 PM  Result Value Ref Range Status   Specimen Description BLOOD LEFT HAND  Final   Special Requests BOTTLES DRAWN AEROBIC AND ANAEROBIC 5CCS  Final   Culture   Final           BLOOD CULTURE RECEIVED NO GROWTH TO DATE CULTURE WILL BE HELD FOR 5 DAYS BEFORE ISSUING A FINAL NEGATIVE REPORT Performed at [facility]    Report Status PENDING  Incomplete   Medications:  I have reviewed the patient's current medications. Prior to Admission:  Prescriptions prior to admission  Medication Sig Dispense Refill Last  Dose  . acetaminophen (TYLENOL) 500 MG tablet Take 500 mg by mouth every 6 (six) hours as needed.   04/16/2014 at Unknown time  . ibuprofen (ADVIL,MOTRIN) 200 MG tablet Take 200 mg by mouth every 6 (six) hours as needed.   04/15/2014 at Unknown time   Scheduled Meds: . ampicillin-sulbactam (UNASYN) IV  3 g Intravenous Q6H  . enoxaparin (LOVENOX) injection  40 mg Subcutaneous Q24H  . vancomycin  1,000 mg Intravenous Q12H   Continuous Infusions:  PRN Meds:.HYDROcodone-acetaminophen, ipratropium-albuterol Assessment/Plan: Principal Problem:   Cellulitis Active Problems:   Cat bite  RUE Cellulitis: Secondary to a cat bite. Greatly improved. Patient remains afebrile, leukocytosis 13.2>11>9.1. HIV non-reactive.  -Unasyn 3 grams IV Q6H  -Vancomycin 1000 mg IV  BID -Transition to oral antibiotics, likely Augmentin 875/125 and Doxycycline on discharge for a total of 5-10 days of antibiotics -Norco 5-325 mg 1-2 tabs Q4H prn -Heart healthy diet -HgbA1c pending -Discharge to home  Pre-diabetes: HgbA1c 6.1. -Counseled on diet and exercise.   DVT/PE ppx: Lovenox SQ QD  FEN: Heart Healthy  CODE: FULL  .Services Needed at time of discharge: Y = Yes, Blank = No PT:   OT:   RN:   Equipment:   Other:     LOS: 3 days   Osa Craver, DO PGY-1 Internal Medicine Resident Pager # [phone] 04/19/2014 10:38 AM

## 2014-04-19 NOTE — Clinical Social Work Note (Signed)
[facility] transporting patient back to facility. CSW has notified RN and NT of transports expected arrival at [place] entrance in about 15. CSW signing off.   Liz Beach MSW, [place], [place], [phone]

## 2014-04-19 NOTE — Discharge Instructions (Signed)
Thank you for allowing [place] to be involved in your healthcare while you were hospitalized at [facility].   Please note that there have been changes to your home medications.  --> PLEASE LOOK AT YOUR DISCHARGE MEDICATION LIST FOR DETAILS.  Please call your PCP if you have any questions or concerns, or any difficulty getting any of your medications.  Please return to the ER if you have worsening of your symptoms or new severe symptoms arise.   FOR YOUR INFECTION: -TAKE BACTRIM 1 PILL TWICE A DAY FOR 7 MORE DAYS -TAKE FLAGYL 1 PILL THREE TIMES DAY FOR 7 MORE DAYS  PLEASE FOLLOW UP IN OUR INTERNAL MEDICINE CLINIC ([place]) ON May 04, 2014 at 3:15 pm.   Cellulitis Cellulitis is an infection of the skin and the tissue under the skin. The infected area is usually red and tender. This happens most often in the arms and lower legs. HOME CARE   Take your antibiotic medicine as told. Finish the medicine even if you start to feel better.  Keep the infected arm or leg raised (elevated).  Put a warm cloth on the area up to 4 times per day.  Only take medicines as told by your doctor.  Keep all doctor visits as told. GET HELP IF:  You see red streaks on the skin coming from the infected area.  Your red area gets bigger or turns a dark color.  Your bone or joint under the infected area is painful after the skin heals.  Your infection comes back in the same area or different area.  You have a puffy (swollen) bump in the infected area.  You have new symptoms.  You have a fever. GET HELP RIGHT AWAY IF:   You feel very sleepy.  You throw up (vomit) or have watery poop (diarrhea).  You feel sick and have muscle aches and pains. MAKE SURE YOU:   Understand these instructions.  Will watch your condition.  Will get help right away if you are not doing well or get worse. Document Released: 06/27/2007 Document Revised: 05/25/2013 Document Reviewed:  03/26/2011 [facility] Patient Information 2015 [place], [place]. This information is not intended to replace advice given to you by your health care provider. Make sure you discuss any questions you have with your health care provider.    Animal Bite An animal bite can result in a scratch on the skin, deep open cut, puncture of the skin, crush injury, or tearing away of the skin or a body part. Dogs are responsible for most animal bites. Children are bitten more often than adults. An animal bite can range from very mild to more serious. A small bite from your house pet is no cause for alarm. However, some animal bites can become infected or injure a bone or other tissue. You must seek medical care if:  The skin is broken and bleeding does not slow down or stop after 15 minutes.  The puncture is deep and difficult to clean (such as a cat bite).  Pain, warmth, redness, or pus develops around the wound.  The bite is from a stray animal or rodent. There may be a risk of rabies infection.  The bite is from a snake, raccoon, skunk, fox, coyote, or bat. There may be a risk of rabies infection.  The person bitten has a chronic illness such as diabetes, liver disease, or cancer, or the person takes medicine that lowers the immune system.  There is  concern about the location and severity of the bite. It is important to clean and protect an animal bite wound right away to prevent infection. Follow these steps:  Clean the wound with plenty of water and soap.  Apply an antibiotic cream.  Apply gentle pressure over the wound with a clean towel or gauze to slow or stop bleeding.  Elevate the affected area above the heart to help stop any bleeding.  Seek medical care. Getting medical care within 8 hours of the animal bite leads to the best possible outcome. DIAGNOSIS  Your caregiver will most likely:  Take a detailed history of the animal and the bite injury.  Perform a wound exam.  Take your  medical history. Blood tests or X-rays may be performed. Sometimes, infected bite wounds are cultured and sent to a lab to identify the infectious bacteria.  TREATMENT  Medical treatment will depend on the location and type of animal bite as well as the patient's medical history. Treatment may include:  Wound care, such as cleaning and flushing the wound with saline solution, bandaging, and elevating the affected area.  Antibiotics.  Tetanus immunization.  Rabies immunization.  Leaving the wound open to heal. This is often done with animal bites, due to the high risk of infection. However, in certain cases, wound closure with stitches, wound adhesive, skin adhesive strips, or staples may be used. Infected bites that are left untreated may require intravenous (IV) antibiotics and surgical treatment in the hospital. [address]  Follow your caregiver's instructions for wound care.  Take all medicines as directed.  If your caregiver prescribes antibiotics, take them as directed. Finish them even if you start to feel better.  Follow up with your caregiver for further exams or immunizations as directed. You may need a tetanus shot if:  You cannot remember when you had your last tetanus shot.  You have never had a tetanus shot.  The injury broke your skin. If you get a tetanus shot, your arm may swell, get red, and feel warm to the touch. This is common and not a problem. If you need a tetanus shot and you choose not to have one, there is a rare chance of getting tetanus. Sickness from tetanus can be serious. SEEK MEDICAL CARE IF:  You notice warmth, redness, soreness, swelling, pus discharge, or a bad smell coming from the wound.  You have a red line on the skin coming from the wound.  You have a fever, chills, or a general ill feeling.  You have nausea or vomiting.  You have continued or worsening pain.  You have trouble moving the injured part.  You have other  questions or concerns. MAKE SURE YOU:  Understand these instructions.  Will watch your condition.  Will get help right away if you are not doing well or get worse. Document Released: 09/26/2010 Document Revised: 04/02/2011 Document Reviewed: 09/26/2010 [facility] Patient Information 2015 [place], [place]. This information is not intended to replace advice given to you by your health care provider. Make sure you discuss any questions you have with your health care provider.

## 2014-04-22 LAB — CULTURE, BLOOD (ROUTINE X 2)
Culture: NO GROWTH
Culture: NO GROWTH

## 2014-05-03 ENCOUNTER — Telehealth: Payer: Self-pay | Admitting: Internal Medicine

## 2014-05-03 NOTE — Telephone Encounter (Signed)
Call to patient to confirm appointment for 05/04/14 at 3:15 lmtcb

## 2014-05-04 ENCOUNTER — Ambulatory Visit: Payer: Self-pay | Admitting: Internal Medicine

## 2014-10-20 ENCOUNTER — Emergency Department (INDEPENDENT_AMBULATORY_CARE_PROVIDER_SITE_OTHER)
Admission: EM | Admit: 2014-10-20 | Discharge: 2014-10-20 | Disposition: A | Discharge status: Nursing Home (Includes ICF) | Payer: Self-pay | Source: Home / Self Care | Attending: Family Medicine | Admitting: Family Medicine

## 2014-10-20 ENCOUNTER — Encounter (HOSPITAL_COMMUNITY): Payer: Self-pay | Admitting: Vascular Surgery

## 2014-10-20 ENCOUNTER — Emergency Department (HOSPITAL_COMMUNITY): Payer: Self-pay

## 2014-10-20 ENCOUNTER — Observation Stay (HOSPITAL_COMMUNITY)
Admission: EM | Admit: 2014-10-20 | Discharge: 2014-10-21 | Disposition: A | Discharge status: Home / Self Care | Payer: Self-pay | Attending: Family Medicine | Admitting: Family Medicine

## 2014-10-20 ENCOUNTER — Encounter (HOSPITAL_COMMUNITY): Payer: Self-pay | Admitting: Emergency Medicine

## 2014-10-20 DIAGNOSIS — F17210 Nicotine dependence, cigarettes, uncomplicated: Secondary | ICD-10-CM | POA: Insufficient documentation

## 2014-10-20 DIAGNOSIS — R0781 Pleurodynia: Secondary | ICD-10-CM | POA: Diagnosis present

## 2014-10-20 DIAGNOSIS — R0602 Shortness of breath: Secondary | ICD-10-CM

## 2014-10-20 DIAGNOSIS — Z23 Encounter for immunization: Secondary | ICD-10-CM | POA: Insufficient documentation

## 2014-10-20 DIAGNOSIS — J441 Chronic obstructive pulmonary disease with (acute) exacerbation: Principal | ICD-10-CM | POA: Diagnosis present

## 2014-10-20 DIAGNOSIS — F17200 Nicotine dependence, unspecified, uncomplicated: Secondary | ICD-10-CM | POA: Diagnosis present

## 2014-10-20 DIAGNOSIS — Z720 Tobacco use: Secondary | ICD-10-CM

## 2014-10-20 DIAGNOSIS — R7989 Other specified abnormal findings of blood chemistry: Secondary | ICD-10-CM | POA: Diagnosis present

## 2014-10-20 DIAGNOSIS — R000 Tachycardia, unspecified: Secondary | ICD-10-CM

## 2014-10-20 DIAGNOSIS — R791 Abnormal coagulation profile: Secondary | ICD-10-CM

## 2014-10-20 DIAGNOSIS — D696 Thrombocytopenia, unspecified: Secondary | ICD-10-CM | POA: Diagnosis present

## 2014-10-20 DIAGNOSIS — IMO0001 Reserved for inherently not codable concepts without codable children: Secondary | ICD-10-CM

## 2014-10-20 HISTORY — DX: Nicotine dependence, unspecified, uncomplicated: F17.200

## 2014-10-20 LAB — BASIC METABOLIC PANEL
Anion gap: 10 (ref 5–15)
BUN: 16 mg/dL (ref 6–20)
CO2: 24 mmol/L (ref 22–32)
CREATININE: 1.14 mg/dL (ref 0.61–1.24)
Calcium: 8.9 mg/dL (ref 8.9–10.3)
Chloride: 102 mmol/L (ref 101–111)
GFR calc Af Amer: >60 mL/min (ref >60)
GFR calc non Af Amer: >60 mL/min (ref >60)
Glucose, Bld: 114 mg/dL — ABNORMAL HIGH (ref 65–99)
POTASSIUM: 4.5 mmol/L (ref 3.5–5.1)
SODIUM: 136 mmol/L (ref 135–145)

## 2014-10-20 LAB — CBC
HEMATOCRIT: 42.7 % (ref 39.0–52.0)
HEMOGLOBIN: 14.6 g/dL (ref 13.0–17.0)
MCH: 26.9 pg (ref 26.0–34.0)
MCHC: 34.2 g/dL (ref 30.0–36.0)
MCV: 78.6 fL (ref 78.0–100.0)
Platelets: 120 10*3/uL — ABNORMAL LOW (ref 150–400)
RBC: 5.43 MIL/uL (ref 4.22–5.81)
RDW: 14.4 % (ref 11.5–15.5)
WBC: 6.4 10*3/uL (ref 4.0–10.5)

## 2014-10-20 LAB — I-STAT TROPONIN, ED: Troponin i, poc: 0.01 ng/mL (ref 0.00–0.08)

## 2014-10-20 LAB — URINALYSIS, ROUTINE W REFLEX MICROSCOPIC
BILIRUBIN URINE: NEGATIVE
Glucose, UA: NEGATIVE mg/dL
KETONES UR: 15 mg/dL — AB
LEUKOCYTES UA: NEGATIVE
NITRITE: NEGATIVE
PH: 5 (ref 5.0–8.0)
Protein, ur: >300 mg/dL — AB
Specific Gravity, Urine: 1.017 (ref 1.005–1.030)
UROBILINOGEN UA: 1 mg/dL (ref 0.0–1.0)

## 2014-10-20 LAB — URINE MICROSCOPIC-ADD ON

## 2014-10-20 LAB — D-DIMER, QUANTITATIVE (NOT AT ARMC): D DIMER QUANT: 1.96 ug{FEU}/mL — AB (ref 0.00–0.48)

## 2014-10-20 LAB — TROPONIN I: Troponin I: <0.03 ng/mL (ref <0.031)

## 2014-10-20 MED ORDER — ONDANSETRON HCL 4 MG/2ML IJ SOLN: 4.0000 mg | Freq: Four times a day (QID) | INTRAMUSCULAR | Status: DC | PRN

## 2014-10-20 MED ORDER — IPRATROPIUM-ALBUTEROL 0.5-2.5 (3) MG/3ML IN SOLN
3.0000 mL | Freq: Four times a day (QID) | RESPIRATORY_TRACT | Status: DC
  Administered 2014-10-21 (×3): 3 mL via RESPIRATORY_TRACT
  Filled 2014-10-20 (×4): qty 3

## 2014-10-20 MED ORDER — ACETAMINOPHEN 325 MG PO TABS: 650.0000 mg | ORAL_TABLET | Freq: Four times a day (QID) | ORAL | Status: DC | PRN

## 2014-10-20 MED ORDER — ACETAMINOPHEN 500 MG PO TABS
1000.0000 mg | ORAL_TABLET | Freq: Once | ORAL | Status: AC
  Administered 2014-10-20: 1000 mg via ORAL
  Filled 2014-10-20: qty 2

## 2014-10-20 MED ORDER — IPRATROPIUM-ALBUTEROL 0.5-2.5 (3) MG/3ML IN SOLN
3.0000 mL | Freq: Once | RESPIRATORY_TRACT | Status: AC
  Administered 2014-10-20: 3 mL via RESPIRATORY_TRACT
  Filled 2014-10-20: qty 3

## 2014-10-20 MED ORDER — INFLUENZA VAC SPLIT QUAD 0.5 ML IM SUSY
0.5000 mL | PREFILLED_SYRINGE | INTRAMUSCULAR | Status: AC
  Administered 2014-10-21: 0.5 mL via INTRAMUSCULAR
  Filled 2014-10-20: qty 0.5

## 2014-10-20 MED ORDER — METHYLPREDNISOLONE SODIUM SUCC 125 MG IJ SOLR
125.0000 mg | Freq: Once | INTRAMUSCULAR | Status: AC
  Administered 2014-10-20: 125 mg via INTRAVENOUS
  Filled 2014-10-20: qty 2

## 2014-10-20 MED ORDER — DEXTROSE 5 % IV SOLN
500.0000 mg | INTRAVENOUS | Status: DC
  Administered 2014-10-20: 500 mg via INTRAVENOUS
  Filled 2014-10-20: qty 500

## 2014-10-20 MED ORDER — IOHEXOL 350 MG/ML SOLN
80.0000 mL | Freq: Once | INTRAVENOUS | Status: AC | PRN
  Administered 2014-10-20: 100 mL via INTRAVENOUS

## 2014-10-20 MED ORDER — ENOXAPARIN SODIUM 40 MG/0.4ML ~~LOC~~ SOLN
40.0000 mg | SUBCUTANEOUS | Status: DC
  Administered 2014-10-20: 40 mg via SUBCUTANEOUS
  Filled 2014-10-20: qty 0.4

## 2014-10-20 MED ORDER — METHYLPREDNISOLONE SODIUM SUCC 125 MG IJ SOLR
60.0000 mg | Freq: Four times a day (QID) | INTRAMUSCULAR | Status: DC
  Administered 2014-10-20 – 2014-10-21 (×4): 60 mg via INTRAVENOUS
  Filled 2014-10-20 (×4): qty 2

## 2014-10-20 MED ORDER — DEXTROSE 5 % IV SOLN
1.0000 g | INTRAVENOUS | Status: DC
  Administered 2014-10-20: 1 g via INTRAVENOUS
  Filled 2014-10-20 (×2): qty 10

## 2014-10-20 MED ORDER — SODIUM CHLORIDE 0.9 % IJ SOLN
3.0000 mL | Freq: Two times a day (BID) | INTRAMUSCULAR | Status: DC
  Administered 2014-10-20: 10 mL via INTRAVENOUS
  Administered 2014-10-21: 3 mL via INTRAVENOUS

## 2014-10-20 MED ORDER — ACETAMINOPHEN 650 MG RE SUPP: 650.0000 mg | Freq: Four times a day (QID) | RECTAL | Status: DC | PRN

## 2014-10-20 MED ORDER — BENZONATATE 100 MG PO CAPS
100.0000 mg | ORAL_CAPSULE | Freq: Three times a day (TID) | ORAL | Status: DC
  Administered 2014-10-20 – 2014-10-21 (×3): 100 mg via ORAL
  Filled 2014-10-20 (×3): qty 1

## 2014-10-20 MED ORDER — SODIUM CHLORIDE 0.9 % IV BOLUS (SEPSIS)
1000.0000 mL | Freq: Once | INTRAVENOUS | Status: AC
  Administered 2014-10-20: 1000 mL via INTRAVENOUS

## 2014-10-20 MED ORDER — ONDANSETRON HCL 4 MG PO TABS: 4.0000 mg | ORAL_TABLET | Freq: Four times a day (QID) | ORAL | Status: DC | PRN

## 2014-10-20 MED ORDER — GUAIFENESIN ER 600 MG PO TB12
600.0000 mg | ORAL_TABLET | Freq: Two times a day (BID) | ORAL | Status: DC
  Administered 2014-10-20 – 2014-10-21 (×2): 600 mg via ORAL
  Filled 2014-10-20 (×2): qty 1

## 2014-10-20 NOTE — ED Notes (Signed)
Pt acknowledges he is taking all belongings with him.

## 2014-10-20 NOTE — ED Notes (Signed)
Radiology transport to bring pt to room for room placement

## 2014-10-20 NOTE — ED Provider Notes (Addendum)
CSN: [id number]     Arrival date & time 10/20/14  1302 History   First MD Initiated Contact with Patient 10/20/14 1325     Chief Complaint  Patient presents with  . Chest Pain  . Shortness of Breath   (Consider location/radiation/quality/duration/timing/severity/associated sxs/prior Treatment) Patient is a 54 y.o. male presenting with chest pain. The history is provided by the patient.  Chest Pain Pain location:  R chest and L chest Pain quality: sharp   Pain radiates to:  Does not radiate Pain radiates to the back: no   Pain severity:  Mild Onset quality:  Gradual Duration:  3 days Progression:  Worsening Chronicity:  New Context comment:  Smoker , copd Worsened by:  Smoking Ineffective treatments:  None tried Associated symptoms: fever, shortness of breath and weakness     History reviewed. No pertinent past medical history. Past Surgical History  Procedure Laterality Date  . Appendectomy     History reviewed. No pertinent family history. Social History  Substance Use Topics  . Smoking status: Current Every Day Smoker -- 1.00 packs/day    Types: Cigarettes  . Smokeless tobacco: None  . Alcohol Use: No    Review of Systems  Constitutional: Positive for fever.  Respiratory: Positive for shortness of breath and wheezing.   Cardiovascular: Positive for chest pain.  Neurological: Positive for weakness.  All other systems reviewed and are negative.   Allergies  Review of patient's allergies indicates no known allergies.  Home Medications   Prior to Admission medications   Medication Sig Start Date End Date Taking? Authorizing Provider  acetaminophen (TYLENOL) 500 MG tablet Take 500 mg by mouth every 6 (six) hours as needed.   Yes Historical Provider, MD  ibuprofen (ADVIL,MOTRIN) 200 MG tablet Take 200 mg by mouth every 6 (six) hours as needed.   Yes Historical Provider, MD  metroNIDAZOLE (FLAGYL) 500 MG tablet Take 1 tablet (500 mg total) by mouth 3 (three) times  daily. 04/19/14   Alexa Sherral Hammers, MD  sulfamethoxazole-trimethoprim (SEPTRA DS) 800-160 MG per tablet Take 1 tablet by mouth 2 (two) times daily. 04/19/14   Alexa Sherral Hammers, MD   Meds Ordered and Administered this Visit  Medications - No data to display  BP 153/86 mmHg  Pulse 122  Temp(Src) 100.7 F (38.2 C) (Oral)  Resp 20  SpO2 91% No data found.   Physical Exam  Constitutional: He is oriented to person, place, and time. He appears well-developed and well-nourished.  Neck: Normal range of motion. Neck supple.  Cardiovascular: Normal pulses.  Tachycardia present.  Exam reveals gallop.   No murmur heard. Pulmonary/Chest: He has wheezes. He exhibits tenderness.  Lymphadenopathy:    He has no cervical adenopathy.  Neurological: He is alert and oriented to person, place, and time.  Skin: Skin is warm and dry.  Nursing note and vitals reviewed.   ED Course  Procedures (including critical care time)  Labs Review Labs Reviewed - No data to display 10/20/2014   10/20/2014 ED ECG REPORT   Date: 10/20/2014  Rate: 117  Rhythm: sinus tachycardia  QRS Axis: normal  Intervals: normal  ST/T Wave abnormalities: nonspecific ST/T changes  Conduction Disutrbances:none  Narrative Interpretation:   Old EKG Reviewed: none available  I have personally reviewed the EKG tracing and agree with the computerized printout as noted.  Imaging Review No results found.   Visual Acuity Review  Right Eye Distance:   Left Eye Distance:   Bilateral Distance:  Right Eye Near:   Left Eye Near:    Bilateral Near:         MDM   1. Shortness of breath dyspnea    Sent for eval of fever, sob , tachycardia . Unable to do cxr at [facility], pt poss pna. May need er care or hosp    Billy Fischer, MD 10/20/14 [place], MD 10/20/14 [phone]

## 2014-10-20 NOTE — ED Notes (Signed)
Pt reports to the ED for eval of generalized CP and cramping x 3 days. Reports he has had a non-productive cough x several months. Reports associated symptoms of SOB and lightheadedness with standing. Denies any N/V/D. Pt has been having fevers and chills. Pt A&Ox4, resp e/u,and skin warm and dry.

## 2014-10-20 NOTE — ED Notes (Signed)
Pt being transferred to ED via shuttle for SOB, tachycardia, CP, hypoxia and fever.  Report called to ED RN, Lenna Sciara.

## 2014-10-20 NOTE — ED Provider Notes (Signed)
CSN: [id number]     Arrival date & time 10/20/14  1413 History   First MD Initiated Contact with Patient 10/20/14 1558     Chief Complaint  Patient presents with  . Chest Pain     (Consider location/radiation/quality/duration/timing/severity/associated sxs/prior Treatment) Patient is a 54 y.o. male presenting with chest pain. The history is provided by the patient.  Chest Pain Pain location:  Substernal area Pain quality: sharp   Pain radiates to:  Does not radiate Pain radiates to the back: no   Pain severity:  Mild Onset quality:  Gradual Duration:  3 days Timing:  Constant (and worse with cough) Progression:  Worsening Chronicity:  New Context comment:  COPD, current smoker Relieved by:  None tried Worsened by:  Nothing tried Ineffective treatments:  None tried Associated symptoms: abdominal pain, cough, fever, shortness of breath and weakness   Associated symptoms: no nausea and not vomiting     History reviewed. No pertinent past medical history. Past Surgical History  Procedure Laterality Date  . Appendectomy     No family history on file. Social History  Substance Use Topics  . Smoking status: Current Every Day Smoker -- 1.00 packs/day    Types: Cigarettes  . Smokeless tobacco: None  . Alcohol Use: No    Review of Systems  Constitutional: Positive for fever.  Respiratory: Positive for cough and shortness of breath.   Cardiovascular: Positive for chest pain. Negative for leg swelling.  Gastrointestinal: Positive for abdominal pain. Negative for nausea, vomiting, diarrhea and blood in stool.  Genitourinary: Negative for dysuria and hematuria.  Neurological: Positive for weakness.      Allergies  Review of patient's allergies indicates no known allergies.  Home Medications   Prior to Admission medications   Medication Sig Start Date End Date Taking? Authorizing Provider  acetaminophen (TYLENOL) 500 MG tablet Take 500 mg by mouth every 6 (six) hours as  needed for mild pain.    Yes Historical Provider, MD  metroNIDAZOLE (FLAGYL) 500 MG tablet Take 1 tablet (500 mg total) by mouth 3 (three) times daily. Patient not taking: Reported on 10/20/2014 04/19/14   Alexa Sherral Hammers, MD  sulfamethoxazole-trimethoprim (SEPTRA DS) 800-160 MG per tablet Take 1 tablet by mouth 2 (two) times daily. Patient not taking: Reported on 10/20/2014 04/19/14   Alexa Sherral Hammers, MD   BP 150/89 mmHg  Pulse 113  Temp(Src) 99.7 F (37.6 C) (Oral)  Resp 29  SpO2 92% Physical Exam  Constitutional: He is oriented to person, place, and time. He appears well-developed and well-nourished.  HENT:  Head: Normocephalic and atraumatic.  Mouth/Throat: Oropharynx is clear and moist.  Eyes: Pupils are equal, round, and reactive to light.  Neck: Normal range of motion. Neck supple.  Cardiovascular: Normal rate, regular rhythm, normal heart sounds and intact distal pulses.   No murmur heard. Pulmonary/Chest: Effort normal. No accessory muscle usage. No respiratory distress. He has wheezes. He has rhonchi. He has no rales.  Abdominal: Soft. Bowel sounds are normal. He exhibits no distension. There is no tenderness.  Musculoskeletal: Normal range of motion.  Lymphadenopathy:    He has no cervical adenopathy.  Neurological: He is alert and oriented to person, place, and time.  Skin: Skin is warm and dry.  Psychiatric: He has a normal mood and affect. His behavior is normal.    ED Course  Procedures (including critical care time) Labs Review Labs Reviewed  BASIC METABOLIC PANEL - Abnormal; Notable for the following:  Glucose, Bld 114 (*)    All other components within normal limits  CBC - Abnormal; Notable for the following:    Platelets 120 (*)    All other components within normal limits  D-DIMER, QUANTITATIVE (NOT AT [facility]) - Abnormal; Notable for the following:    D-Dimer, Quant 1.96 (*)    All other components within normal limits  URINALYSIS, ROUTINE W REFLEX  MICROSCOPIC (NOT AT [facility]) - Abnormal; Notable for the following:    Hgb urine dipstick MODERATE (*)    Ketones, ur 15 (*)    Protein, ur >300 (*)    All other components within normal limits  URINE MICROSCOPIC-ADD ON - Abnormal; Notable for the following:    Casts GRANULAR CAST (*)    All other components within normal limits  I-STAT TROPOININ, ED    Imaging Review Dg Chest 2 View  10/20/2014   CLINICAL DATA:  Chest pain, short of breath and cough for 3 days  EXAM: CHEST  2 VIEW  COMPARISON:  None.  FINDINGS: Normal mediastinum and cardiac silhouette. Hyperinflated lungs. Chronic central bronchitic markings. Normal pulmonary vasculature. No effusion, infiltrate, or pneumothorax.  IMPRESSION: Hyperinflated lungs with chronic bronchitic markings. No acute findings   Electronically Signed   By: Suzy Bouchard M.D.   On: 10/20/2014 15:58   Ct Angio Chest Pe W/cm &/or Wo Cm  10/20/2014   CLINICAL DATA:  Shortness of breath. Chest pain. Cough. Fever. Tachycardia. Hypoxia.  EXAM: CT ANGIOGRAPHY CHEST WITH CONTRAST  TECHNIQUE: Multidetector CT imaging of the chest was performed using the standard protocol during bolus administration of intravenous contrast. Multiplanar CT image reconstructions and MIPs were obtained to evaluate the vascular anatomy.  CONTRAST:  174mL OMNIPAQUE IOHEXOL 350 MG/ML SOLN  COMPARISON:  Chest radiograph from earlier today.  FINDINGS: Mediastinum/Nodes: Evaluation of the subsegmental pulmonary arteries is limited by respiratory motion artifact throughout the lungs. There are no filling defects in the central, lobar or segmental pulmonary artery branches to suggest acute pulmonary embolism. There are calcifications within the walls of the left and right pulmonary arteries at the bifurcations (series 4/ image 78 on the right and series 4/ image 68 on the left). Great vessels are normal in course and caliber. Main pulmonary artery diameter is 2.9 cm, within normal limits. Normal  heart size. No pericardial fluid/thickening. There is atherosclerosis of the thoracic aorta, the great vessels of the mediastinum and the coronary arteries, including calcified atherosclerotic plaque in the left anterior descending and left circumflex coronary arteries. Normal visualized thyroid. Normal esophagus. No axillary lymphadenopathy. Mildly enlarged 1.1 cm subcarinal node (series 4/ image 69). Mild bilateral hilar lymphadenopathy, including a 1.3 cm right hilar node (4/67) and a 1.1 cm left hilar node (4/69).  Lungs/Pleura: No pneumothorax. No pleural effusion. Minimal scarring versus atelectasis in the basilar right middle lobe. There is no acute consolidative airspace disease. Faint ground-glass centrilobular nodularity in the upper lobes and superior segments of the lower lobes.  Upper abdomen: Tiny hiatal hernia.  Musculoskeletal: No aggressive appearing focal osseous lesions. Mild degenerative changes in the thoracic spine. Small intramuscular lipoma in the right posterior superficial back (series 4/image 116).  Review of the MIP images confirms the above findings.  IMPRESSION: 1. No evidence of acute pulmonary embolism, with mild limitations as described. Nonspecific calcifications within the walls of the central pulmonary arteries, which could be evidence of chronic pulmonary hypertension, including possibly chronic thromboembolic pulmonary hypertension. 2. Faint ground-glass centrilobular nodularity in both lungs, upper lobe  predominant. If the patient is a smoker, this is most suggestive of smoking related interstitial lung disease. If the patient is not a smoker, consider hypersensitivity pneumonitis. 3. Mild mediastinal and bilateral hilar lymphadenopathy, nonspecific, most likely reactive. 4. Atherosclerosis, including two-vessel coronary artery disease. Please note that although the presence of coronary artery calcium documents the presence of coronary artery disease, the severity of this  disease and any potential stenosis cannot be assessed on this non-gated CT examination. Assessment for potential risk factor modification, dietary therapy or pharmacologic therapy may be warranted, if clinically indicated.   Electronically Signed   By: Ilona Sorrel M.D.   On: 10/20/2014 19:46   I have personally reviewed and evaluated these images and lab results as part of my medical decision-making.   EKG Interpretation None      MDM   Final diagnoses:  Shortness of breath  Tachycardia  COPD exacerbation    Patient presents with 3 day history of worsening chest pain with cough, fevers, abdominal pain, and muscle cramps.  Temp on arrival 100.7.  Heart rate 122.  Oxygen saturation ranging from 91-95%.  No history of PE/DVT.  Denies leg swelling.  Initial labs show negative troponin, BMP and CBC unremarkable.  CXR shows hyperinflated lungs with chronic bronchitic markings.  Concern for COPD exacerbation vs PE.  Will give tylenol for pain.  PERC criteria-3, cannot r/o PE, will order d-dimer.  D-dimer positive, will order CTA chest to evaluate for PE.  Will also begin fluids, duoneb, solu-medrol. CTA negative for PE.  Upon reassessment, patient is still SOB stating at 94% on RA.   If CTA negative, will admit for COPD exacerbation.  Admit for COPD exacerbation per Dr. Posey Pronto with [facility].    Gloriann Loan, PA-C 10/20/14 2024  Wandra Arthurs, MD 10/21/14 [phone]

## 2014-10-20 NOTE — ED Notes (Signed)
Patient has had chest pain, cough, and shortness of breath for three days.  Pt is slightly febrile today at 100.7.  Pt states the chest pain is an 8 when coughing.  He has been suffering from body cramps for several months, but in the last three days it has been mainly in his abdomen and chest.  Pt is tachycardic and slightly hypoxic.

## 2014-10-20 NOTE — H&P (Signed)
[facility] History and Physical  Patient: Christian Newton  MRN: [id number]  DOB: 04/18/1960  DOS: the patient was seen and examined on 10/20/2014 PCP: No primary care provider on file.  Referring physician: Dr. Darl Householder Chief Complaint: Shortness of breath  HPI: Christian Newton is a 54 y.o. male with Past medical history of active smoker. The patient presents with complaints of progressively worsening shortness of breath. He also comments of chest tightness as well as chest pain. This is ongoing for last 3 days. Patient also had some fever as well as chills. Patient brings up yellow expectoration with his cough. Denies having any blood in it. No nausea no vomiting. No abdominal pain. No diarrhea no constipation. Since his shortness of breath was progressively worsening as well as chest pain was also involving it is hard to come to the hospital. Next and patient is an active smoker. Does not take any medication at his baseline.  The patient is coming from home.  At his baseline ambulates without support And is independent for most of his ADL; manages his medication on his own.  Review of Systems: as mentioned in the history of present illness.  A comprehensive review of the other systems is negative.  Past Medical History  Diagnosis Date  . Smoker    Past Surgical History  Procedure Laterality Date  . Appendectomy     Social History:  reports that he has been smoking Cigarettes.  He has been smoking about 1.00 pack per day. He does not have any smokeless tobacco history on file. He reports that he does not drink alcohol or use illicit drugs.  No Known Allergies  No family history on file.  Prior to Admission medications   Medication Sig Start Date End Date Taking? Authorizing Provider  acetaminophen (TYLENOL) 500 MG tablet Take 500 mg by mouth every 6 (six) hours as needed for mild pain.    Yes Historical Provider, MD  metroNIDAZOLE (FLAGYL) 500 MG tablet Take 1 tablet  (500 mg total) by mouth 3 (three) times daily. Patient not taking: Reported on 10/20/2014 04/19/14   Alexa Sherral Hammers, MD  sulfamethoxazole-trimethoprim (SEPTRA DS) 800-160 MG per tablet Take 1 tablet by mouth 2 (two) times daily. Patient not taking: Reported on 10/20/2014 04/19/14   Alexa Sherral Hammers, MD    Physical Exam: Filed Vitals:   10/20/14 1815 10/20/14 2030 10/20/14 2100 10/20/14 2123  BP: 150/89 141/83 138/87 161/88  Pulse: 113 109 103 106  Temp:    98.9 F (37.2 C)  TempSrc:    Oral  Resp: 29 22 19 20   SpO2: 92% 99% 100% 93%    General: Alert, Awake and Oriented to Time, Place and Person. Appear in mild distress Eyes: PERRL ENT: Oral Mucosa clear moist. Neck: no JVD Cardiovascular: S1 and S2 Present, no Murmur, Peripheral Pulses Present Respiratory: Bilateral Air entry equal and Decreased,  no Crackles, expiratory wheezes Abdomen: Bowel Sound present, Soft and no tenderness Skin: no Rash Extremities: no Pedal edema, no calf tenderness Neurologic: Grossly no focal neuro deficit.  Labs on Admission:  CBC:  Recent Labs Lab 10/20/14 1435  WBC 6.4  HGB 14.6  HCT 42.7  MCV 78.6  PLT 120*    CMP     Component Value Date/Time   NA 136 10/20/2014 1435   K 4.5 10/20/2014 1435   CL 102 10/20/2014 1435   CO2 24 10/20/2014 1435   GLUCOSE 114* 10/20/2014 1435   BUN 16 10/20/2014 1435   CREATININE  1.14 10/20/2014 1435   CALCIUM 8.9 10/20/2014 1435   PROT 7.3 04/16/2014 1259   ALBUMIN 4.3 04/16/2014 1259   AST 16 04/16/2014 1259   ALT 13 04/16/2014 1259   ALKPHOS 64 04/16/2014 1259   BILITOT 1.0 04/16/2014 1259   GFRNONAA >60 10/20/2014 1435   GFRAA >60 10/20/2014 1435    No results for input(s): CKTOTAL, CKMB, CKMBINDEX, TROPONINI in the last 168 hours. BNP (last 3 results) No results for input(s): BNP in the last 8760 hours.  ProBNP (last 3 results) No results for input(s): PROBNP in the last 8760 hours.   Radiological Exams on Admission: Dg Chest  2 View  10/20/2014   CLINICAL DATA:  Chest pain, short of breath and cough for 3 days  EXAM: CHEST  2 VIEW  COMPARISON:  None.  FINDINGS: Normal mediastinum and cardiac silhouette. Hyperinflated lungs. Chronic central bronchitic markings. Normal pulmonary vasculature. No effusion, infiltrate, or pneumothorax.  IMPRESSION: Hyperinflated lungs with chronic bronchitic markings. No acute findings   Electronically Signed   By: Suzy Bouchard M.D.   On: 10/20/2014 15:58   Ct Angio Chest Pe W/cm &/or Wo Cm  10/20/2014   CLINICAL DATA:  Shortness of breath. Chest pain. Cough. Fever. Tachycardia. Hypoxia.  EXAM: CT ANGIOGRAPHY CHEST WITH CONTRAST  TECHNIQUE: Multidetector CT imaging of the chest was performed using the standard protocol during bolus administration of intravenous contrast. Multiplanar CT image reconstructions and MIPs were obtained to evaluate the vascular anatomy.  CONTRAST:  135mL OMNIPAQUE IOHEXOL 350 MG/ML SOLN  COMPARISON:  Chest radiograph from earlier today.  FINDINGS: Mediastinum/Nodes: Evaluation of the subsegmental pulmonary arteries is limited by respiratory motion artifact throughout the lungs. There are no filling defects in the central, lobar or segmental pulmonary artery branches to suggest acute pulmonary embolism. There are calcifications within the walls of the left and right pulmonary arteries at the bifurcations (series 4/ image 78 on the right and series 4/ image 68 on the left). Great vessels are normal in course and caliber. Main pulmonary artery diameter is 2.9 cm, within normal limits. Normal heart size. No pericardial fluid/thickening. There is atherosclerosis of the thoracic aorta, the great vessels of the mediastinum and the coronary arteries, including calcified atherosclerotic plaque in the left anterior descending and left circumflex coronary arteries. Normal visualized thyroid. Normal esophagus. No axillary lymphadenopathy. Mildly enlarged 1.1 cm subcarinal node (series  4/ image 69). Mild bilateral hilar lymphadenopathy, including a 1.3 cm right hilar node (4/67) and a 1.1 cm left hilar node (4/69).  Lungs/Pleura: No pneumothorax. No pleural effusion. Minimal scarring versus atelectasis in the basilar right middle lobe. There is no acute consolidative airspace disease. Faint ground-glass centrilobular nodularity in the upper lobes and superior segments of the lower lobes.  Upper abdomen: Tiny hiatal hernia.  Musculoskeletal: No aggressive appearing focal osseous lesions. Mild degenerative changes in the thoracic spine. Small intramuscular lipoma in the right posterior superficial back (series 4/image 116).  Review of the MIP images confirms the above findings.  IMPRESSION: 1. No evidence of acute pulmonary embolism, with mild limitations as described. Nonspecific calcifications within the walls of the central pulmonary arteries, which could be evidence of chronic pulmonary hypertension, including possibly chronic thromboembolic pulmonary hypertension. 2. Faint ground-glass centrilobular nodularity in both lungs, upper lobe predominant. If the patient is a smoker, this is most suggestive of smoking related interstitial lung disease. If the patient is not a smoker, consider hypersensitivity pneumonitis. 3. Mild mediastinal and bilateral hilar lymphadenopathy, nonspecific, most likely  reactive. 4. Atherosclerosis, including two-vessel coronary artery disease. Please note that although the presence of coronary artery calcium documents the presence of coronary artery disease, the severity of this disease and any potential stenosis cannot be assessed on this non-gated CT examination. Assessment for potential risk factor modification, dietary therapy or pharmacologic therapy may be warranted, if clinically indicated.   Electronically Signed   By: Ilona Sorrel M.D.   On: 10/20/2014 19:46   EKG: Independently reviewed. nonspecific ST and T waves changes, sinus  tachycardia.  Assessment/Plan 1. COPD exacerbation The patient is presenting with complaints of progressively worsening shortness of breath. Patient did also have some chest pain. A d-dimer was elevated and therefore a CT scan was performed which shows centrilobular emphysema. With this the patient appears to be having a COPD exacerbation. He will be treated with dual nebs, Solu-Medrol, antibiotics. Will check influenza PCR as well as sputum culture and urine antigens. Monitor on telemetry.  2. Chest pain. Next on the patient is having pleuritic chest pain. Would check a troponin. Monitor on telemetry. Less likely cardiac in nature. Further cardiac workup only if the troponins are elevated.  3  Smoker Patient counseled to stop smoking. patient is agreeing with stopping smoking. We will continue with the nicotine patch at present.  4  Thrombocytopenia Etiology currently unclear we'll continue to closely monitor in the prone platelets are less than 100 will need to discontinue DVT prophylaxis.  Nutrition: Regular diet DVT Prophylaxis: subcutaneous Heparin  Advance goals of care discussion: Full code  Disposition: Admitted as observation, telemetry unit.  Author: Berle Mull, MD [facility] Pager: [phone] 10/20/2014  If 7PM-7AM, please contact night-coverage [email or web address] Password [email or web address]

## 2014-10-21 LAB — CBC
HCT: 39.5 % (ref 39.0–52.0)
Hemoglobin: 13.5 g/dL (ref 13.0–17.0)
MCH: 26.4 pg (ref 26.0–34.0)
MCHC: 34.2 g/dL (ref 30.0–36.0)
MCV: 77.1 fL — ABNORMAL LOW (ref 78.0–100.0)
Platelets: 108 10*3/uL — ABNORMAL LOW (ref 150–400)
RBC: 5.12 MIL/uL (ref 4.22–5.81)
RDW: 14.3 % (ref 11.5–15.5)
WBC: 4.2 10*3/uL (ref 4.0–10.5)

## 2014-10-21 LAB — COMPREHENSIVE METABOLIC PANEL
ALBUMIN: 3.2 g/dL — AB (ref 3.5–5.0)
ALT: 15 U/L — AB (ref 17–63)
AST: 22 U/L (ref 15–41)
Alkaline Phosphatase: 52 U/L (ref 38–126)
Anion gap: 11 (ref 5–15)
BILIRUBIN TOTAL: 0.7 mg/dL (ref 0.3–1.2)
BUN: 12 mg/dL (ref 6–20)
CALCIUM: 8.4 mg/dL — AB (ref 8.9–10.3)
CO2: 21 mmol/L — AB (ref 22–32)
CREATININE: 0.91 mg/dL (ref 0.61–1.24)
Chloride: 105 mmol/L (ref 101–111)
GFR calc Af Amer: >60 mL/min (ref >60)
GFR calc non Af Amer: >60 mL/min (ref >60)
GLUCOSE: 162 mg/dL — AB (ref 65–99)
Potassium: 4.1 mmol/L (ref 3.5–5.1)
Sodium: 137 mmol/L (ref 135–145)
TOTAL PROTEIN: 6.7 g/dL (ref 6.5–8.1)

## 2014-10-21 LAB — TROPONIN I
Troponin I: <0.03 ng/mL (ref <0.031)
Troponin I: <0.03 ng/mL (ref <0.031)

## 2014-10-21 LAB — INFLUENZA PANEL BY PCR (TYPE A & B)
H1N1 flu by pcr: NOT DETECTED
INFLBPCR: NEGATIVE
Influenza A By PCR: NEGATIVE

## 2014-10-21 LAB — MRSA PCR SCREENING: MRSA BY PCR: NEGATIVE

## 2014-10-21 LAB — STREP PNEUMONIAE URINARY ANTIGEN: STREP PNEUMO URINARY ANTIGEN: NEGATIVE

## 2014-10-21 MED ORDER — AZITHROMYCIN 500 MG PO TABS: 500.0000 mg | ORAL_TABLET | Freq: Every day | ORAL | Status: DC

## 2014-10-21 MED ORDER — ALBUTEROL SULFATE HFA 108 (90 BASE) MCG/ACT IN AERS: 2.0000 | INHALATION_SPRAY | Freq: Four times a day (QID) | RESPIRATORY_TRACT | Status: DC | PRN

## 2014-10-21 MED ORDER — BENZONATATE 100 MG PO CAPS: 100.0000 mg | ORAL_CAPSULE | Freq: Three times a day (TID) | ORAL | Status: DC

## 2014-10-21 MED ORDER — NICOTINE 14 MG/24HR TD PT24
14.0000 mg | MEDICATED_PATCH | Freq: Every day | TRANSDERMAL | Status: DC
  Administered 2014-10-21: 14 mg via TRANSDERMAL
  Filled 2014-10-21: qty 1

## 2014-10-21 MED ORDER — PREDNISONE 50 MG PO TABS: ORAL_TABLET | ORAL | Status: DC

## 2014-10-21 MED ORDER — CEFDINIR 300 MG PO CAPS: 300.0000 mg | ORAL_CAPSULE | Freq: Two times a day (BID) | ORAL | Status: DC

## 2014-10-21 NOTE — Discharge Summary (Signed)
Physician Discharge Summary  Christian Newton RAQ:[id number] DOB: [birth year] DOA: 10/20/2014  PCP: No primary care provider on file.  Admit date: 10/20/2014 Discharge date: 10/21/2014  Time spent: > 20 minutes  Recommendations for Outpatient Follow-up:  1.  Please obtain PFT's 2. Continue to encourage tobacco cessation  Discharge Diagnoses:  Principal Problem:   COPD exacerbation Active Problems:   Smoker   Positive D dimer   Chest pain, pleuritic   Thrombocytopenia   Discharge Condition: stable  Diet recommendation: Regular diet  Filed Weights   10/20/14 2128 10/21/14 0533  Weight: 77.1 kg (169 lb 15.6 oz) 77.3 kg (170 lb 6.7 oz)    History of present illness:  54 year old smoker who presented with COPD exacerbation.  Hospital Course:  COPD exacerbation - improved with steroids, bronchodilators, and antibiotics. Will discharge no this regimen.   Nicotine dependence - Recommended cessation  Procedures:  None  Consultations:  None  Discharge Exam: Filed Vitals:   10/21/14 1341  BP: 141/88  Pulse: 108  Temp: 98.7 F (37.1 C)  Resp: 16    General: Pt in nad, alert and awake Cardiovascular: rrr, no mrg Respiratory: cta bl, prolonged exp phase, no wheezes  Discharge Instructions   Discharge Instructions    Call MD for:  difficulty breathing, headache or visual disturbances    Complete by:  As directed      Call MD for:  severe uncontrolled pain    Complete by:  As directed      Call MD for:  temperature >100.4    Complete by:  As directed      Diet - low sodium heart healthy    Complete by:  As directed      Discharge instructions    Complete by:  As directed   Please follow-up with primary care physician within the next one or 2 weeks or sooner should any new concerns arise     Increase activity slowly    Complete by:  As directed           Current Discharge Medication List    START taking these medications   Details  albuterol (PROVENTIL  HFA;VENTOLIN HFA) 108 (90 BASE) MCG/ACT inhaler Inhale 2 puffs into the lungs every 6 (six) hours as needed for wheezing or shortness of breath. Qty: 1 Inhaler, Refills: 0    azithromycin (ZITHROMAX) 500 MG tablet Take 1 tablet (500 mg total) by mouth at bedtime. Qty: 4 tablet, Refills: 0    benzonatate (TESSALON) 100 MG capsule Take 1 capsule (100 mg total) by mouth 3 (three) times daily. Qty: 20 capsule, Refills: 0    cefdinir (OMNICEF) 300 MG capsule Take 1 capsule (300 mg total) by mouth 2 (two) times daily. Qty: 12 capsule, Refills: 0    predniSONE (DELTASONE) 50 MG tablet Take one tablet by mouth daily Qty: 4 tablet, Refills: 0      CONTINUE these medications which have NOT CHANGED   Details  acetaminophen (TYLENOL) 500 MG tablet Take 500 mg by mouth every 6 (six) hours as needed for mild pain.       STOP taking these medications     metroNIDAZOLE (FLAGYL) 500 MG tablet      sulfamethoxazole-trimethoprim (SEPTRA DS) 800-160 MG per tablet        No Known Allergies    The results of significant diagnostics from this hospitalization (including imaging, microbiology, ancillary and laboratory) are listed below for reference.    Significant Diagnostic Studies: Dg Chest 2 View  10/20/2014   CLINICAL DATA:  Chest pain, short of breath and cough for 3 days  EXAM: CHEST  2 VIEW  COMPARISON:  None.  FINDINGS: Normal mediastinum and cardiac silhouette. Hyperinflated lungs. Chronic central bronchitic markings. Normal pulmonary vasculature. No effusion, infiltrate, or pneumothorax.  IMPRESSION: Hyperinflated lungs with chronic bronchitic markings. No acute findings   Electronically Signed   By: Suzy Bouchard M.D.   On: 10/20/2014 15:58   Ct Angio Chest Pe W/cm &/or Wo Cm  10/20/2014   CLINICAL DATA:  Shortness of breath. Chest pain. Cough. Fever. Tachycardia. Hypoxia.  EXAM: CT ANGIOGRAPHY CHEST WITH CONTRAST  TECHNIQUE: Multidetector CT imaging of the chest was performed using the  standard protocol during bolus administration of intravenous contrast. Multiplanar CT image reconstructions and MIPs were obtained to evaluate the vascular anatomy.  CONTRAST:  157mL OMNIPAQUE IOHEXOL 350 MG/ML SOLN  COMPARISON:  Chest radiograph from earlier today.  FINDINGS: Mediastinum/Nodes: Evaluation of the subsegmental pulmonary arteries is limited by respiratory motion artifact throughout the lungs. There are no filling defects in the central, lobar or segmental pulmonary artery branches to suggest acute pulmonary embolism. There are calcifications within the walls of the left and right pulmonary arteries at the bifurcations (series 4/ image 78 on the right and series 4/ image 68 on the left). Great vessels are normal in course and caliber. Main pulmonary artery diameter is 2.9 cm, within normal limits. Normal heart size. No pericardial fluid/thickening. There is atherosclerosis of the thoracic aorta, the great vessels of the mediastinum and the coronary arteries, including calcified atherosclerotic plaque in the left anterior descending and left circumflex coronary arteries. Normal visualized thyroid. Normal esophagus. No axillary lymphadenopathy. Mildly enlarged 1.1 cm subcarinal node (series 4/ image 69). Mild bilateral hilar lymphadenopathy, including a 1.3 cm right hilar node (4/67) and a 1.1 cm left hilar node (4/69).  Lungs/Pleura: No pneumothorax. No pleural effusion. Minimal scarring versus atelectasis in the basilar right middle lobe. There is no acute consolidative airspace disease. Faint ground-glass centrilobular nodularity in the upper lobes and superior segments of the lower lobes.  Upper abdomen: Tiny hiatal hernia.  Musculoskeletal: No aggressive appearing focal osseous lesions. Mild degenerative changes in the thoracic spine. Small intramuscular lipoma in the right posterior superficial back (series 4/image 116).  Review of the MIP images confirms the above findings.  IMPRESSION: 1. No  evidence of acute pulmonary embolism, with mild limitations as described. Nonspecific calcifications within the walls of the central pulmonary arteries, which could be evidence of chronic pulmonary hypertension, including possibly chronic thromboembolic pulmonary hypertension. 2. Faint ground-glass centrilobular nodularity in both lungs, upper lobe predominant. If the patient is a smoker, this is most suggestive of smoking related interstitial lung disease. If the patient is not a smoker, consider hypersensitivity pneumonitis. 3. Mild mediastinal and bilateral hilar lymphadenopathy, nonspecific, most likely reactive. 4. Atherosclerosis, including two-vessel coronary artery disease. Please note that although the presence of coronary artery calcium documents the presence of coronary artery disease, the severity of this disease and any potential stenosis cannot be assessed on this non-gated CT examination. Assessment for potential risk factor modification, dietary therapy or pharmacologic therapy may be warranted, if clinically indicated.   Electronically Signed   By: Ilona Sorrel M.D.   On: 10/20/2014 19:46    Microbiology: Recent Results (from the past 240 hour(s))  MRSA PCR Screening     Status: None   Collection Time: 10/21/14  1:47 AM  Result Value Ref Range Status   MRSA  by PCR NEGATIVE NEGATIVE Final    Comment:        The GeneXpert MRSA Assay (FDA approved for NASAL specimens only), is one component of a comprehensive MRSA colonization surveillance program. It is not intended to diagnose MRSA infection nor to guide or monitor treatment for MRSA infections.      Labs: Basic Metabolic Panel:  Recent Labs Lab 10/20/14 1435 10/21/14 0357  NA 136 137  K 4.5 4.1  CL 102 105  CO2 24 21*  GLUCOSE 114* 162*  BUN 16 12  CREATININE 1.14 0.91  CALCIUM 8.9 8.4*   Liver Function Tests:  Recent Labs Lab 10/21/14 0357  AST 22  ALT 15*  ALKPHOS 52  BILITOT 0.7  PROT 6.7  ALBUMIN  3.2*   No results for input(s): LIPASE, AMYLASE in the last 168 hours. No results for input(s): AMMONIA in the last 168 hours. CBC:  Recent Labs Lab 10/20/14 1435 10/21/14 0357  WBC 6.4 4.2  HGB 14.6 13.5  HCT 42.7 39.5  MCV 78.6 77.1*  PLT 120* 108*   Cardiac Enzymes:  Recent Labs Lab 10/20/14 2204 10/21/14 0357 10/21/14 0910  TROPONINI <0.03 <0.03 <0.03   BNP: BNP (last 3 results) No results for input(s): BNP in the last 8760 hours.  ProBNP (last 3 results) No results for input(s): PROBNP in the last 8760 hours.  CBG: No results for input(s): GLUCAP in the last 168 hours.     Signed:  Velvet Bathe  [facility] 10/21/2014, 2:13 PM

## 2014-10-21 NOTE — Progress Notes (Signed)
[name] discharged Home per MD order.  Discharge instructions reviewed and discussed with the patient, all questions and concerns answered. Copy of instructions, care notes and scripts given to patient.    Medication List    STOP taking these medications        metroNIDAZOLE 500 MG tablet  Commonly known as:  FLAGYL     sulfamethoxazole-trimethoprim 800-160 MG tablet  Commonly known as:  SEPTRA DS      TAKE these medications        acetaminophen 500 MG tablet  Commonly known as:  TYLENOL  Take 500 mg by mouth every 6 (six) hours as needed for mild pain.     albuterol 108 (90 BASE) MCG/ACT inhaler  Commonly known as:  PROVENTIL HFA;VENTOLIN HFA  Inhale 2 puffs into the lungs every 6 (six) hours as needed for wheezing or shortness of breath.     azithromycin 500 MG tablet  Commonly known as:  ZITHROMAX  Take 1 tablet (500 mg total) by mouth at bedtime.     benzonatate 100 MG capsule  Commonly known as:  TESSALON  Take 1 capsule (100 mg total) by mouth 3 (three) times daily.     cefdinir 300 MG capsule  Commonly known as:  OMNICEF  Take 1 capsule (300 mg total) by mouth 2 (two) times daily.     predniSONE 50 MG tablet  Commonly known as:  DELTASONE  Take one tablet by mouth daily        Patients skin is clean, dry and intact, no evidence of skin break down. IV site discontinued and catheter remains intact. Site without signs and symptoms of complications. Dressing and pressure applied.  Patient escorted to car by charge nurse in a wheelchair,  no distress noted upon discharge.  [name], [name] 10/21/2014 5:43 PM

## 2014-10-21 NOTE — Progress Notes (Addendum)
Nutrition Brief Note  Patient identified on the Malnutrition Screening Tool (MST) Report  Wt Readings from Last 15 Encounters:  10/21/14 170 lb 6.7 oz (77.3 kg)  04/16/14 185 lb 6 oz (84.086 kg)   Christian Newton is a 54 y.o. male with Past medical history of active smoker. The patient presents with complaints of progressively worsening shortness of breath. He also comments of chest tightness as well as chest pain.  Pt admitted for COPD exacerbation.   Pt reports appetite is usually good; consumes 3 meals per day PTA. He reports he consumed his eggs and bacon this AM. He reveals UBW of 165#. He reveals he was 185# back in March, but started following a healthier lifestyle due to being in a substance abuse rehab program ("I've been eating more and drinking less"). He feels like his weight has stabilized. Wt of 168# confirmed on bedscale.   Nutrition-Focused physical exam completed. Findings are no fat depletion, no muscle depletion, and no edema.   Pt denied any further nutritional needs, but expressed appreciation for visit. Discussed importance of continued good PO intake to promote healing.  Body mass index is 25.92 kg/(m^2). Patient meets criteria for overweight based on current BMI.   Current diet order is regular, patient is consuming approximately n/a% of meals at this time. Labs and medications reviewed.   No nutrition interventions warranted at this time. If nutrition issues arise, please consult RD.   Jenifer A. Jimmye Norman, RD, LDN, CDE Pager: [phone] After hours Pager: [phone]

## 2014-10-21 NOTE — Care Management Note (Addendum)
Case Management Note  Patient Details  Name: Christian Newton MRN: [id number] Date of Birth: 06/12/1960  Subjective/Objective:      Date: 10/21/14 Spoke with patient at the bedside . Introduced self as Tourist information centre manager and explained role in discharge planning and how to be reached. Verified patient lives in town, at [place], has DME . Expressed no potential need for no other DME. Verified patient anticipates to go to [facility]  at time of discharge and will have full-time supervision  at this time to best of their knowledge. Patient confirmed or denied needing help with their medication. Patient  is driven by friend to MD appointments. Verified patient has no  PCP follow up apt scheduled at the [facility] cell clinic for [facility] .   Patient will go to [facility] clinic to pick up his medications at dc.  NCM gave patient the brochure and directions, also gave brochure for hospital follow up at sickle cell clinic.  Plan: CM will continue to follow for discharge planning and [facility] resources.               Action/Plan:   Expected Discharge Date:                  Expected Discharge Plan:  Home/Self Care  In-House Referral:     Discharge planning Services  CM Consult  Post Acute Care Choice:    Choice offered to:     DME Arranged:    DME Agency:     HH Arranged:    HH Agency:     Status of Service:  In process, will continue to follow  Medicare Important Message Given:    Date Medicare IM Given:    Medicare IM give by:    Date Additional Medicare IM Given:    Additional Medicare Important Message give by:     If discussed at [place] of Stay Meetings, dates discussed:    Additional Comments:  Zenon Mayo, RN 10/21/2014, 12:17 PM

## 2014-10-22 LAB — LEGIONELLA PNEUMOPHILA SEROGP 1 UR AG: L. PNEUMOPHILA SEROGP 1 UR AG: NEGATIVE

## 2014-10-26 ENCOUNTER — Ambulatory Visit: Payer: Self-pay | Admitting: Family Medicine

## 2014-11-01 ENCOUNTER — Ambulatory Visit: Payer: Self-pay | Admitting: Family Medicine

## 2015-05-11 ENCOUNTER — Emergency Department (HOSPITAL_COMMUNITY): Payer: Self-pay

## 2015-05-11 ENCOUNTER — Emergency Department (HOSPITAL_COMMUNITY)
Admission: EM | Admit: 2015-05-11 | Discharge: 2015-05-12 | Disposition: A | Discharge status: Home / Self Care | Payer: Self-pay | Attending: Emergency Medicine | Admitting: Emergency Medicine

## 2015-05-11 ENCOUNTER — Encounter (HOSPITAL_COMMUNITY): Payer: Self-pay | Admitting: Emergency Medicine

## 2015-05-11 DIAGNOSIS — Y998 Other external cause status: Secondary | ICD-10-CM | POA: Insufficient documentation

## 2015-05-11 DIAGNOSIS — S61432A Puncture wound without foreign body of left hand, initial encounter: Secondary | ICD-10-CM | POA: Insufficient documentation

## 2015-05-11 DIAGNOSIS — Y9389 Activity, other specified: Secondary | ICD-10-CM | POA: Insufficient documentation

## 2015-05-11 DIAGNOSIS — S61452A Open bite of left hand, initial encounter: Secondary | ICD-10-CM | POA: Insufficient documentation

## 2015-05-11 DIAGNOSIS — Z23 Encounter for immunization: Secondary | ICD-10-CM | POA: Insufficient documentation

## 2015-05-11 DIAGNOSIS — F17210 Nicotine dependence, cigarettes, uncomplicated: Secondary | ICD-10-CM | POA: Insufficient documentation

## 2015-05-11 DIAGNOSIS — R509 Fever, unspecified: Secondary | ICD-10-CM

## 2015-05-11 DIAGNOSIS — W5501XA Bitten by cat, initial encounter: Secondary | ICD-10-CM | POA: Insufficient documentation

## 2015-05-11 DIAGNOSIS — Y9289 Other specified places as the place of occurrence of the external cause: Secondary | ICD-10-CM | POA: Insufficient documentation

## 2015-05-11 DIAGNOSIS — L03114 Cellulitis of left upper limb: Secondary | ICD-10-CM | POA: Insufficient documentation

## 2015-05-11 HISTORY — DX: Open bite of left hand, initial encounter: S61.452A

## 2015-05-11 LAB — CBC
HEMATOCRIT: 41.3 % (ref 39.0–52.0)
Hemoglobin: 13.8 g/dL (ref 13.0–17.0)
MCH: 26.5 pg (ref 26.0–34.0)
MCHC: 33.4 g/dL (ref 30.0–36.0)
MCV: 79.3 fL (ref 78.0–100.0)
PLATELETS: 173 10*3/uL (ref 150–400)
RBC: 5.21 MIL/uL (ref 4.22–5.81)
RDW: 14.3 % (ref 11.5–15.5)
WBC: 16.3 10*3/uL — AB (ref 4.0–10.5)

## 2015-05-11 LAB — BASIC METABOLIC PANEL
ANION GAP: 12 (ref 5–15)
BUN: 13 mg/dL (ref 6–20)
CALCIUM: 9.2 mg/dL (ref 8.9–10.3)
CO2: 21 mmol/L — AB (ref 22–32)
CREATININE: 0.99 mg/dL (ref 0.61–1.24)
Chloride: 104 mmol/L (ref 101–111)
Glucose, Bld: 105 mg/dL — ABNORMAL HIGH (ref 65–99)
Potassium: 5.9 mmol/L — ABNORMAL HIGH (ref 3.5–5.1)
SODIUM: 137 mmol/L (ref 135–145)

## 2015-05-11 LAB — I-STAT CHEM 8, ED
BUN: 13 mg/dL (ref 6–20)
Calcium, Ion: 1.11 mmol/L — ABNORMAL LOW (ref 1.12–1.23)
Chloride: 105 mmol/L (ref 101–111)
Creatinine, Ser: 0.9 mg/dL (ref 0.61–1.24)
Glucose, Bld: 110 mg/dL — ABNORMAL HIGH (ref 65–99)
HEMATOCRIT: 42 % (ref 39.0–52.0)
HEMOGLOBIN: 14.3 g/dL (ref 13.0–17.0)
POTASSIUM: 3.9 mmol/L (ref 3.5–5.1)
Sodium: 140 mmol/L (ref 135–145)
TCO2: 22 mmol/L (ref 0–100)

## 2015-05-11 LAB — I-STAT CG4 LACTIC ACID, ED: Lactic Acid, Venous: 1.65 mmol/L (ref 0.5–2.0)

## 2015-05-11 MED ORDER — TETANUS-DIPHTH-ACELL PERTUSSIS 5-2.5-18.5 LF-MCG/0.5 IM SUSP
0.5000 mL | Freq: Once | INTRAMUSCULAR | Status: AC
  Administered 2015-05-11: 0.5 mL via INTRAMUSCULAR
  Filled 2015-05-11: qty 0.5

## 2015-05-11 MED ORDER — OXYCODONE HCL 5 MG PO TABS
10.0000 mg | ORAL_TABLET | Freq: Once | ORAL | Status: AC
  Administered 2015-05-11: 10 mg via ORAL
  Filled 2015-05-11: qty 2

## 2015-05-11 MED ORDER — AMPICILLIN-SULBACTAM SODIUM 3 (2-1) G IJ SOLR
3.0000 g | Freq: Once | INTRAMUSCULAR | Status: AC
  Administered 2015-05-11: 3 g via INTRAVENOUS
  Filled 2015-05-11: qty 3

## 2015-05-11 MED ORDER — ACETAMINOPHEN 500 MG PO TABS
1000.0000 mg | ORAL_TABLET | Freq: Once | ORAL | Status: AC
  Administered 2015-05-11: 1000 mg via ORAL
  Filled 2015-05-11: qty 2

## 2015-05-11 MED ORDER — RABIES IMMUNE GLOBULIN 150 UNIT/ML IM INJ
20.0000 [IU]/kg | INJECTION | Freq: Once | INTRAMUSCULAR | Status: AC
  Administered 2015-05-11: 1575 [IU] via INTRAMUSCULAR
  Filled 2015-05-11: qty 10.5

## 2015-05-11 MED ORDER — SODIUM CHLORIDE 0.9 % IV BOLUS (SEPSIS)
1000.0000 mL | Freq: Once | INTRAVENOUS | Status: AC
  Administered 2015-05-11: 1000 mL via INTRAVENOUS

## 2015-05-11 MED ORDER — RABIES VACCINE, PCEC IM SUSR
1.0000 mL | Freq: Once | INTRAMUSCULAR | Status: AC
  Administered 2015-05-11: 1 mL via INTRAMUSCULAR
  Filled 2015-05-11: qty 1

## 2015-05-11 NOTE — ED Notes (Signed)
Pt to xray, EDP aware of inc in temp and drop in HR.

## 2015-05-11 NOTE — ED Provider Notes (Signed)
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 He has significant pain with palpation, the arm is red in streaks extending from the wrist, there is tenderness and redness but no purulent discharge from the puncture wounds of the hand. The patient needs a tetanus shot, he needs rabies vaccinations, we'll consult with hand surgery, start antibiotics.  D/w Dr. Burney Gauze - appreciate his timely consult He agrees with Unasyn, outpatient Augmentin and he will f/u in the office tomorrow. He recommends no occlusive dressings - warm soapy soaks Pt appears stable for d/c - he has no tachycardia, some leukocytosis, normal lactic  Medical screening examination/treatment/procedure(s) were conducted as a shared visit with non-physician practitioner(s) and myself.  I personally evaluated the patient during the encounter.  Clinical Impression:   Final diagnoses:  Cat bite of left hand, initial encounter  Cellulitis of left hand excluding fingers and thumb  Fever, unspecified fever cause         Noemi Chapel, MD 05/12/15 [phone]

## 2015-05-11 NOTE — ED Provider Notes (Signed)
CSN: [id number]     Arrival date & time 05/11/15  1727 History   First MD Initiated Contact with Patient 05/11/15 2040     Chief Complaint  Patient presents with  . Animal Bite     (Consider location/radiation/quality/duration/timing/severity/associated sxs/prior Treatment) The history is provided by the patient and medical records. No language interpreter was used.     Christian Newton is a 55 y.o. male  with no major medical problems presents to the Emergency Department complaining of gradual, persistent, progressively worsening pain and swelling of the left hand onset over the course of the day.  Pt was bitten by a ferral cat at the animal shelter this morning at 8:30am.  He reports it became red and swollen around noon today with a significant increase in pain around 4:30pm.  Pt denies a hx of steroid usage, immunocompromise or diabetes.  Pt reports previous rabies vaccine but did not receive the immunoglobulin.  Associated decreased ROM is present due to swelling and pain.  He also reports chills.  Pt denies measured fever, headache, neck pain, chest pain, SOB, abd pain, N/V/D, weakness, dizziness, numbness, tingling.    Past Medical History  Diagnosis Date  . Smoker    Past Surgical History  Procedure Laterality Date  . Appendectomy     History reviewed. No pertinent family history. Social History  Substance Use Topics  . Smoking status: Current Every Day Smoker -- 1.00 packs/day    Types: Cigarettes  . Smokeless tobacco: None  . Alcohol Use: No    Review of Systems  Constitutional: Positive for chills. Negative for fever, diaphoresis, appetite change, fatigue and unexpected weight change.  HENT: Negative for mouth sores.   Eyes: Negative for visual disturbance.  Respiratory: Negative for cough, chest tightness, shortness of breath and wheezing.   Cardiovascular: Negative for chest pain.  Gastrointestinal: Negative for nausea, vomiting, abdominal pain, diarrhea and  constipation.  Endocrine: Negative for polydipsia, polyphagia and polyuria.  Genitourinary: Negative for dysuria, urgency, frequency and hematuria.  Musculoskeletal: Positive for joint swelling and arthralgias. Negative for back pain, neck pain and neck stiffness.  Skin: Positive for color change and wound. Negative for rash.  Allergic/Immunologic: Negative for immunocompromised state.  Neurological: Negative for syncope, light-headedness, numbness and headaches.  Hematological: Does not bruise/bleed easily.  Psychiatric/Behavioral: Negative for sleep disturbance. The patient is not nervous/anxious.   All other systems reviewed and are negative.     Allergies  Review of patient's allergies indicates no known allergies.  Home Medications   Prior to Admission medications   Medication Sig Start Date End Date Taking? Authorizing Provider  acetaminophen (TYLENOL) 500 MG tablet Take 500 mg by mouth every 6 (six) hours as needed for mild pain.    Yes Historical Provider, MD  naproxen sodium (ANAPROX) 220 MG tablet Take 220 mg by mouth 2 (two) times daily as needed (for pain).   Yes Historical Provider, MD  amoxicillin-clavulanate (AUGMENTIN) 875-125 MG tablet Take 1 tablet by mouth every 12 (twelve) hours. 05/12/15   Terrion Gencarelli, PA-C  oxyCODONE-acetaminophen (PERCOCET) 5-325 MG tablet Take 1-2 tablets by mouth every 4 (four) hours as needed. 05/12/15   Raevon Broom, PA-C   BP 153/93 mmHg  Pulse 46  Temp(Src) 100.8 F (38.2 C) (Oral)  Resp 20  Ht 5\' 8"  (1.727 m)  Wt 76.975 kg  BMI 25.81 kg/m2  SpO2 100% Physical Exam  Constitutional: He appears well-developed and well-nourished. No distress.  Awake, alert, nontoxic appearance  HENT:  Head:  Normocephalic and atraumatic.  Mouth/Throat: Oropharynx is clear and moist. No oropharyngeal exudate.  Eyes: Conjunctivae are normal. No scleral icterus.  Neck: Normal range of motion. Neck supple.  Cardiovascular: Normal rate,  regular rhythm, normal heart sounds and intact distal pulses.   Capillary refill < 3 sec  Pulmonary/Chest: Effort normal and breath sounds normal. No respiratory distress. He has no wheezes.  Equal chest expansion  Abdominal: Soft. Bowel sounds are normal. He exhibits no mass. There is no tenderness. There is no rebound and no guarding.  Musculoskeletal: Normal range of motion. He exhibits tenderness. He exhibits no edema.  ROM: Decreased range of motion of the MCP joints of the left hand due to significant swelling and pain; full range of motion of the DIP and PIP joints on the left hand; decreased flexion and extension of the wrist due to pain, full pronation and supination of the forearm  Neurological: He is alert. Coordination normal.  Sensation intact to dull and sharp in the left upper extremity Strength 4/5 grip strength in the left hand due to pain  Skin: Skin is warm and dry. He is not diaphoretic.  No tenting of the skin Puncture wounds of the radiodorsal aspect of the hand with erythema and significant edema Erythema extends up the forearm approximately midway along the radial side  Psychiatric: He has a normal mood and affect.  Nursing note and vitals reviewed.          ED Course  Procedures (including critical care time)  Results for orders placed or performed during the hospital encounter of 05/11/15  CBC  Result Value Ref Range   WBC 16.3 (H) 4.0 - 10.5 K/uL   RBC 5.21 4.22 - 5.81 MIL/uL   Hemoglobin 13.8 13.0 - 17.0 g/dL   HCT 41.3 39.0 - 52.0 %   MCV 79.3 78.0 - 100.0 fL   MCH 26.5 26.0 - 34.0 pg   MCHC 33.4 30.0 - 36.0 g/dL   RDW 14.3 11.5 - 15.5 %   Platelets 173 150 - 400 K/uL  Basic metabolic panel  Result Value Ref Range   Sodium 137 135 - 145 mmol/L   Potassium 5.9 (H) 3.5 - 5.1 mmol/L   Chloride 104 101 - 111 mmol/L   CO2 21 (L) 22 - 32 mmol/L   Glucose, Bld 105 (H) 65 - 99 mg/dL   BUN 13 6 - 20 mg/dL   Creatinine, Ser 0.99 0.61 - 1.24 mg/dL    Calcium 9.2 8.9 - 10.3 mg/dL   GFR calc non Af Amer >60 >60 mL/min   GFR calc Af Amer >60 >60 mL/min   Anion gap 12 5 - 15  I-Stat CG4 Lactic Acid, ED  (not at  [facility])  Result Value Ref Range   Lactic Acid, Venous 1.65 0.5 - 2.0 mmol/L  I-stat chem 8, ed  Result Value Ref Range   Sodium 140 135 - 145 mmol/L   Potassium 3.9 3.5 - 5.1 mmol/L   Chloride 105 101 - 111 mmol/L   BUN 13 6 - 20 mg/dL   Creatinine, Ser 0.90 0.61 - 1.24 mg/dL   Glucose, Bld 110 (H) 65 - 99 mg/dL   Calcium, Ion 1.11 (L) 1.12 - 1.23 mmol/L   TCO2 22 0 - 100 mmol/L   Hemoglobin 14.3 13.0 - 17.0 g/dL   HCT 42.0 39.0 - 52.0 %    Imaging Review Dg Hand Complete Left  05/11/2015  CLINICAL DATA:  55 year old male with injury to the  left hand by a cat this morning, now with pain and swelling. EXAM: LEFT HAND - COMPLETE 3+ VIEW COMPARISON:  None. FINDINGS: No retained radiopaque foreign body in the soft tissues. No acute displaced fracture, subluxation or dislocation. 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 IMPRESSION: 1. No acute radiographic abnormality of the left hand. Electronically Signed   By: Vinnie Langton M.D.   On: 05/11/2015 21:29   I have personally reviewed and evaluated these images and lab results as part of my medical decision-making.    MDM   Final diagnoses:  Cat bite of left hand, initial encounter  Cellulitis of left hand excluding fingers and thumb  Fever, unspecified fever cause   [place] Barsanti presents with cat bite to the left hand.  Erythematous with rapidly spreading cellulitis.  Doubt abscess with the time course of this infection.  X-ray without evidence of foreign bodies or teeth.    Dr. Sabra Heck discussed the patient with Dr. Burney Gauze who recommended Unasyn 3g IV and d/c home with augmentin.  He will see the patient in the office tomorrow.  He recommend warm soapy soaks and recommends that the wounds NOT be covered with anything.    1:43 AM Pt pain  improved.  Hand soaked in warm soapy water. No dressing applied. Swelling and erythema persist.  Pt pain is improved as well. Pt had received Unasyn and fluids.  He is without emesis.  Normal lactic acid.  Leukocytosis noted.  Pt is well appearing, without hypotension and he has f/u this AM in the Clinic with Dr. Burney Gauze.  Discussed return precautions.  Pt is in agreement with the plan.  The patient was discussed with and seen by Dr. Noemi Chapel who agrees with the treatment plan.  BP 138/83 mmHg  Pulse 104  Temp(Src) 100.1 F (37.8 C) (Oral)  Resp 20  Ht 5\' 8"  (1.727 m)  Wt 76.975 kg  BMI 25.81 kg/m2  SpO2 91%   Abigail Butts, PA-C 05/12/15 0147  Noemi Chapel, MD 05/12/15 [phone]

## 2015-05-11 NOTE — ED Notes (Signed)
Pt sts bitten by cat today in left hand; pt sts some chills and swelling noted

## 2015-05-11 NOTE — ED Notes (Signed)
EDP at bedside  

## 2015-05-12 DIAGNOSIS — W5501XA Bitten by cat, initial encounter: Secondary | ICD-10-CM | POA: Insufficient documentation

## 2015-05-12 DIAGNOSIS — S61452A Open bite of left hand, initial encounter: Secondary | ICD-10-CM | POA: Insufficient documentation

## 2015-05-12 MED ORDER — OXYCODONE-ACETAMINOPHEN 5-325 MG PO TABS: 1.0000 | ORAL_TABLET | ORAL | Status: DC | PRN

## 2015-05-12 MED ORDER — AMOXICILLIN-POT CLAVULANATE 875-125 MG PO TABS: 1.0000 | ORAL_TABLET | Freq: Two times a day (BID) | ORAL | Status: DC

## 2015-05-12 MED ORDER — IBUPROFEN 400 MG PO TABS
600.0000 mg | ORAL_TABLET | Freq: Once | ORAL | Status: AC
  Administered 2015-05-12: 600 mg via ORAL
  Filled 2015-05-12: qty 1

## 2015-05-12 MED ORDER — MORPHINE SULFATE (PF) 4 MG/ML IV SOLN
4.0000 mg | Freq: Once | INTRAVENOUS | Status: AC
  Administered 2015-05-12: 4 mg via INTRAVENOUS
  Filled 2015-05-12: qty 1

## 2015-05-12 NOTE — Discharge Instructions (Signed)
1. Medications: augmentin, percocet, usual home medications 2. Treatment: rest, drink plenty of fluids, warm soapy water soaks every 2-3 hours; DO NOT COVER the wound 3. Follow Up: Please followup with Dr. Burney Gauze later today - Call his office this AM to set up an appointment; Please return to the ER for worsening symptoms before f/u with Dr. Burney Gauze.    Animal Bite Animal bites can range from mild to serious. An animal bite can result in a scratch on the skin, a deep open cut, a puncture of the skin, a crush injury, or tearing away of the skin or a body part. A small bite from a house pet will usually not cause serious problems. However, some animal bites can become infected or injure a bone or other tissue.  Bites from certain animals can be more dangerous because of the risk of spreading rabies, which is a serious viral infection. This risk is higher with bites from stray animals or wild animals, such as raccoons, foxes, skunks, and bats. Dogs are responsible for most animal bites. Children are bitten more often than adults. SYMPTOMS  Common symptoms of an animal bite include:   Pain.   Bleeding.   Swelling.   Bruising.  DIAGNOSIS  This condition may be diagnosed based on a physical exam and medical history. Your health care provider will examine the wound and ask for details about the animal and how the bite happened. You may also have tests, such as:   Blood tests to check for infection or to determine if surgery is needed.  X-rays to check for damage to bones or joints.  Culture test. This uses a sample of fluid from the wound to check for infection. TREATMENT  Treatment varies depending on the location and type of animal bite and your medical history. Treatment may include:   Wound care. This often includes cleaning the wound, flushing the wound with saline solution, and applying a bandage (dressing). Sometimes, the wound is left open to heal because of the high risk of  infection. However, in some cases, the wound may be closed with stitches (sutures), staples, skin glue, or adhesive strips.   Antibiotic medicine.   Tetanus shot.   Rabies treatment if the animal could have rabies.  In some cases, bites that have become infected may require IV antibiotics and surgical treatment in the hospital.  [place]  Follow instructions from your health care provider about how to take care of your wound. Make sure you:  Wash your hands with soap and water before you change your dressing. If soap and water are not available, use hand sanitizer.  Change your dressing as told by your health care provider.  Leave sutures, skin glue, or adhesive strips in place. These skin closures may need to be in place for 2 weeks or longer. If adhesive strip edges start to loosen and curl up, you may trim the loose edges. Do not remove adhesive strips completely unless your health care provider tells you to do that.  Check your wound every day for signs of infection. Watch for:   Increasing redness, swelling, or pain.   Fluid, blood, or pus.  General Instructions  Take or apply over-the-counter and prescription medicines only as told by your health care provider.   If you were prescribed an antibiotic, take or apply it as told by your health care provider. Do not stop using the antibiotic even if your condition improves.   Keep the injured area  raised (elevated) above the level of your heart while you are sitting or lying down, if this is possible.   If directed, apply ice to the injured area.   Put ice in a plastic bag.   Place a towel between your skin and the bag.   Leave the ice on for 20 minutes, 2-3 times per day.   Keep all follow-up visits as told by your health care provider. This is important.  SEEK MEDICAL CARE IF:  You have increasing redness, swelling, or pain at the site of your wound.   You have a general  feeling of sickness (malaise).   You feel nauseous or you vomit.   You have pain that does not get better.  SEEK IMMEDIATE MEDICAL CARE IF:  You have a red streak extending away from your wound.   You have fluid, blood, or pus coming from your wound.   You have a fever or chills.   You have trouble moving your injured area.   You have numbness or tingling extending beyond the wound.   This information is not intended to replace advice given to you by your health care provider. Make sure you discuss any questions you have with your health care provider.   Document Released: 09/26/2010 Document Revised: 09/29/2014 Document Reviewed: 05/26/2014 Elsevier Interactive Patient Education [employer].

## 2015-05-13 ENCOUNTER — Encounter (HOSPITAL_COMMUNITY)
Admission: RE | Disposition: A | Discharge status: Home / Self Care | Payer: Self-pay | Source: Ambulatory Visit | Attending: Internal Medicine

## 2015-05-13 ENCOUNTER — Encounter (HOSPITAL_COMMUNITY): Payer: Self-pay | Admitting: *Deleted

## 2015-05-13 ENCOUNTER — Ambulatory Visit (HOSPITAL_COMMUNITY): Payer: MEDICAID | Admitting: Anesthesiology

## 2015-05-13 ENCOUNTER — Inpatient Hospital Stay (HOSPITAL_COMMUNITY)
Admission: RE | Admit: 2015-05-13 | Discharge: 2015-05-15 | DRG: 580 | Disposition: A | Discharge status: Home / Self Care | Payer: Self-pay | Source: Ambulatory Visit | Attending: Internal Medicine | Admitting: Internal Medicine

## 2015-05-13 ENCOUNTER — Other Ambulatory Visit: Payer: Self-pay | Admitting: Orthopedic Surgery

## 2015-05-13 DIAGNOSIS — W5501XA Bitten by cat, initial encounter: Secondary | ICD-10-CM

## 2015-05-13 DIAGNOSIS — L03114 Cellulitis of left upper limb: Principal | ICD-10-CM | POA: Diagnosis present

## 2015-05-13 DIAGNOSIS — J449 Chronic obstructive pulmonary disease, unspecified: Secondary | ICD-10-CM | POA: Diagnosis present

## 2015-05-13 DIAGNOSIS — L02512 Cutaneous abscess of left hand: Secondary | ICD-10-CM | POA: Diagnosis present

## 2015-05-13 DIAGNOSIS — F17210 Nicotine dependence, cigarettes, uncomplicated: Secondary | ICD-10-CM | POA: Diagnosis present

## 2015-05-13 DIAGNOSIS — Z79899 Other long term (current) drug therapy: Secondary | ICD-10-CM

## 2015-05-13 DIAGNOSIS — Z23 Encounter for immunization: Secondary | ICD-10-CM

## 2015-05-13 DIAGNOSIS — S61452A Open bite of left hand, initial encounter: Secondary | ICD-10-CM | POA: Diagnosis present

## 2015-05-13 DIAGNOSIS — Z720 Tobacco use: Secondary | ICD-10-CM | POA: Diagnosis present

## 2015-05-13 DIAGNOSIS — E875 Hyperkalemia: Secondary | ICD-10-CM | POA: Diagnosis present

## 2015-05-13 DIAGNOSIS — Y9289 Other specified places as the place of occurrence of the external cause: Secondary | ICD-10-CM

## 2015-05-13 DIAGNOSIS — L03113 Cellulitis of right upper limb: Secondary | ICD-10-CM

## 2015-05-13 HISTORY — PX: I&D EXTREMITY: SHX5045

## 2015-05-13 HISTORY — DX: Open bite of left hand, initial encounter: S61.452A

## 2015-05-13 HISTORY — PX: INCISION AND DRAINAGE: SHX5863

## 2015-05-13 HISTORY — DX: Bitten by cat, initial encounter: W55.01XA

## 2015-05-13 HISTORY — DX: Chronic obstructive pulmonary disease, unspecified: J44.9

## 2015-05-13 LAB — GRAM STAIN

## 2015-05-13 LAB — SURGICAL PCR SCREEN
MRSA, PCR: NEGATIVE
Staphylococcus aureus: POSITIVE — AB

## 2015-05-13 SURGERY — IRRIGATION AND DEBRIDEMENT EXTREMITY
Anesthesia: General | Site: Arm Lower | Laterality: Left

## 2015-05-13 MED ORDER — MIDAZOLAM HCL 5 MG/5ML IJ SOLN
INTRAMUSCULAR | Status: DC | PRN
  Administered 2015-05-13 (×2): 1 mg via INTRAVENOUS

## 2015-05-13 MED ORDER — MORPHINE SULFATE (PF) 2 MG/ML IV SOLN
2.0000 mg | INTRAVENOUS | Status: DC | PRN
  Administered 2015-05-14: 2 mg via INTRAVENOUS
  Filled 2015-05-13: qty 1

## 2015-05-13 MED ORDER — BUPIVACAINE HCL (PF) 0.25 % IJ SOLN
INTRAMUSCULAR | Status: AC
  Filled 2015-05-13: qty 30

## 2015-05-13 MED ORDER — SODIUM CHLORIDE 0.9 % IR SOLN
Status: DC | PRN
  Administered 2015-05-13: 1000 mL

## 2015-05-13 MED ORDER — ONDANSETRON HCL 4 MG/2ML IJ SOLN: 4.0000 mg | Freq: Four times a day (QID) | INTRAMUSCULAR | Status: DC | PRN

## 2015-05-13 MED ORDER — MUPIROCIN 2 % EX OINT
TOPICAL_OINTMENT | CUTANEOUS | Status: AC
  Filled 2015-05-13: qty 22

## 2015-05-13 MED ORDER — FENTANYL CITRATE (PF) 100 MCG/2ML IJ SOLN
INTRAMUSCULAR | Status: AC
  Filled 2015-05-13: qty 2

## 2015-05-13 MED ORDER — CHLORHEXIDINE GLUCONATE 4 % EX LIQD: 60.0000 mL | Freq: Once | CUTANEOUS | Status: DC

## 2015-05-13 MED ORDER — TETANUS-DIPHTH-ACELL PERTUSSIS 5-2.5-18.5 LF-MCG/0.5 IM SUSP
0.5000 mL | Freq: Once | INTRAMUSCULAR | Status: DC
  Filled 2015-05-13: qty 0.5

## 2015-05-13 MED ORDER — SODIUM CHLORIDE 0.9 % IV SOLN
INTRAVENOUS | Status: DC
  Administered 2015-05-13 – 2015-05-14 (×2): via INTRAVENOUS

## 2015-05-13 MED ORDER — FENTANYL CITRATE (PF) 100 MCG/2ML IJ SOLN
INTRAMUSCULAR | Status: DC | PRN
  Administered 2015-05-13 (×5): 50 ug via INTRAVENOUS

## 2015-05-13 MED ORDER — LACTATED RINGERS IV SOLN
INTRAVENOUS | Status: DC
  Administered 2015-05-13: 14:00:00 via INTRAVENOUS

## 2015-05-13 MED ORDER — FENTANYL CITRATE (PF) 100 MCG/2ML IJ SOLN
INTRAMUSCULAR | Status: AC
  Administered 2015-05-13: 25 ug
  Filled 2015-05-13: qty 2

## 2015-05-13 MED ORDER — MUPIROCIN 2 % EX OINT
1.0000 "application " | TOPICAL_OINTMENT | Freq: Once | CUTANEOUS | Status: AC
  Administered 2015-05-13: 1 via NASAL

## 2015-05-13 MED ORDER — BUPIVACAINE HCL (PF) 0.25 % IJ SOLN
INTRAMUSCULAR | Status: DC | PRN
Start: 2015-05-13 — End: 2015-05-13

## 2015-05-13 MED ORDER — PROPOFOL 10 MG/ML IV BOLUS
INTRAVENOUS | Status: DC | PRN
  Administered 2015-05-13: 150 mg via INTRAVENOUS

## 2015-05-13 MED ORDER — ACETAMINOPHEN 650 MG RE SUPP: 650.0000 mg | Freq: Four times a day (QID) | RECTAL | Status: DC | PRN

## 2015-05-13 MED ORDER — POLYETHYLENE GLYCOL 3350 17 G PO PACK: 17.0000 g | PACK | Freq: Every day | ORAL | Status: DC | PRN

## 2015-05-13 MED ORDER — SODIUM CHLORIDE 0.9 % IV SOLN
3.0000 g | Freq: Four times a day (QID) | INTRAVENOUS | Status: DC
  Administered 2015-05-13 – 2015-05-15 (×8): 3 g via INTRAVENOUS
  Filled 2015-05-13 (×12): qty 3

## 2015-05-13 MED ORDER — FENTANYL CITRATE (PF) 100 MCG/2ML IJ SOLN
25.0000 ug | INTRAMUSCULAR | Status: DC | PRN
  Administered 2015-05-13 (×2): 50 ug via INTRAVENOUS

## 2015-05-13 MED ORDER — MIDAZOLAM HCL 2 MG/2ML IJ SOLN
INTRAMUSCULAR | Status: AC
  Filled 2015-05-13: qty 2

## 2015-05-13 MED ORDER — HYDROCODONE-ACETAMINOPHEN 5-325 MG PO TABS
1.0000 | ORAL_TABLET | ORAL | Status: DC | PRN
  Administered 2015-05-14 – 2015-05-15 (×5): 2 via ORAL
  Filled 2015-05-13 (×5): qty 2

## 2015-05-13 MED ORDER — BISACODYL 5 MG PO TBEC: 5.0000 mg | DELAYED_RELEASE_TABLET | Freq: Every day | ORAL | Status: DC | PRN

## 2015-05-13 MED ORDER — IPRATROPIUM-ALBUTEROL 0.5-2.5 (3) MG/3ML IN SOLN: 3.0000 mL | RESPIRATORY_TRACT | Status: DC | PRN

## 2015-05-13 MED ORDER — ACETAMINOPHEN 325 MG PO TABS
650.0000 mg | ORAL_TABLET | Freq: Four times a day (QID) | ORAL | Status: DC | PRN
  Administered 2015-05-13: 650 mg via ORAL
  Filled 2015-05-13: qty 2

## 2015-05-13 MED ORDER — LIDOCAINE HCL (CARDIAC) 20 MG/ML IV SOLN
INTRAVENOUS | Status: DC | PRN
  Administered 2015-05-13: 100 mg via INTRAVENOUS

## 2015-05-13 MED ORDER — ONDANSETRON HCL 4 MG PO TABS: 4.0000 mg | ORAL_TABLET | Freq: Four times a day (QID) | ORAL | Status: DC | PRN

## 2015-05-13 MED ORDER — FENTANYL CITRATE (PF) 250 MCG/5ML IJ SOLN
INTRAMUSCULAR | Status: AC
  Filled 2015-05-13: qty 5

## 2015-05-13 MED ORDER — ENOXAPARIN SODIUM 40 MG/0.4ML ~~LOC~~ SOLN
40.0000 mg | SUBCUTANEOUS | Status: DC
  Administered 2015-05-14 – 2015-05-15 (×2): 40 mg via SUBCUTANEOUS
  Filled 2015-05-13 (×2): qty 0.4

## 2015-05-13 MED ORDER — ONDANSETRON HCL 4 MG/2ML IJ SOLN
INTRAMUSCULAR | Status: DC | PRN
  Administered 2015-05-13: 4 mg via INTRAVENOUS

## 2015-05-13 SURGICAL SUPPLY — 53 items
BANDAGE ELASTIC 3 VELCRO ST LF (GAUZE/BANDAGES/DRESSINGS) ×3 IMPLANT
BANDAGE ELASTIC 4 VELCRO ST LF (GAUZE/BANDAGES/DRESSINGS) ×3 IMPLANT
BNDG CMPR 9X4 STRL LF SNTH (GAUZE/BANDAGES/DRESSINGS)
BNDG CONFORM 2 STRL LF (GAUZE/BANDAGES/DRESSINGS) IMPLANT
BNDG ESMARK 4X9 LF (GAUZE/BANDAGES/DRESSINGS) IMPLANT
BNDG GAUZE ELAST 4 BULKY (GAUZE/BANDAGES/DRESSINGS) ×3 IMPLANT
CORDS BIPOLAR (ELECTRODE) IMPLANT
COVER SURGICAL LIGHT HANDLE (MISCELLANEOUS) ×6 IMPLANT
CUFF TOURNIQUET SINGLE 18IN (TOURNIQUET CUFF) ×3 IMPLANT
DECANTER SPIKE VIAL GLASS SM (MISCELLANEOUS) ×3 IMPLANT
DRAIN PENROSE 1/4X12 LTX STRL (WOUND CARE) IMPLANT
DRAPE SURG 17X23 STRL (DRAPES) ×3 IMPLANT
DRSG PAD ABDOMINAL 8X10 ST (GAUZE/BANDAGES/DRESSINGS) ×6 IMPLANT
DURAPREP 26ML APPLICATOR (WOUND CARE) IMPLANT
ELECT REM PT RETURN 9FT ADLT (ELECTROSURGICAL)
ELECTRODE REM PT RTRN 9FT ADLT (ELECTROSURGICAL) IMPLANT
GAUZE IODOFORM PACK 1/2 7832 (GAUZE/BANDAGES/DRESSINGS) ×3 IMPLANT
GAUZE PACKING IODOFORM 1/4X5 (PACKING) IMPLANT
GAUZE SPONGE 4X4 12PLY STRL (GAUZE/BANDAGES/DRESSINGS) ×6 IMPLANT
GAUZE XEROFORM 1X8 LF (GAUZE/BANDAGES/DRESSINGS) ×3 IMPLANT
GLOVE SURG SYN 8.0 (GLOVE) ×3 IMPLANT
GOWN STRL REUS W/ TWL LRG LVL3 (GOWN DISPOSABLE) ×1 IMPLANT
GOWN STRL REUS W/ TWL XL LVL3 (GOWN DISPOSABLE) ×1 IMPLANT
GOWN STRL REUS W/TWL LRG LVL3 (GOWN DISPOSABLE) ×3
GOWN STRL REUS W/TWL XL LVL3 (GOWN DISPOSABLE) ×3
HANDPIECE INTERPULSE COAX TIP (DISPOSABLE)
KIT BASIN OR (CUSTOM PROCEDURE TRAY) ×3 IMPLANT
KIT ROOM TURNOVER OR (KITS) ×3 IMPLANT
MANIFOLD NEPTUNE II (INSTRUMENTS) ×3 IMPLANT
NEEDLE HYPO 25GX1X1/2 BEV (NEEDLE) IMPLANT
NEEDLE HYPO 25X1 1.5 SAFETY (NEEDLE) ×3 IMPLANT
NS IRRIG 1000ML POUR BTL (IV SOLUTION) ×3 IMPLANT
PACK ORTHO EXTREMITY (CUSTOM PROCEDURE TRAY) ×3 IMPLANT
PAD ABD 8X10 STRL (GAUZE/BANDAGES/DRESSINGS) ×3 IMPLANT
PAD ARMBOARD 7.5X6 YLW CONV (MISCELLANEOUS) ×6 IMPLANT
PAD CAST 4YDX4 CTTN HI CHSV (CAST SUPPLIES) ×1 IMPLANT
PADDING CAST COTTON 4X4 STRL (CAST SUPPLIES) ×3
SET HNDPC FAN SPRY TIP SCT (DISPOSABLE) IMPLANT
SPONGE GAUZE 4X4 12PLY STER LF (GAUZE/BANDAGES/DRESSINGS) ×3 IMPLANT
SPONGE LAP 18X18 X RAY DECT (DISPOSABLE) ×3 IMPLANT
SUCTION FRAZIER HANDLE 10FR (MISCELLANEOUS) ×2
SUCTION TUBE FRAZIER 10FR DISP (MISCELLANEOUS) ×1 IMPLANT
SUT VICRYL RAPIDE 4/0 PS 2 (SUTURE) IMPLANT
SYR 20CC LL (SYRINGE) IMPLANT
SYR CONTROL 10ML LL (SYRINGE) ×3 IMPLANT
TOWEL OR 17X24 6PK STRL BLUE (TOWEL DISPOSABLE) ×3 IMPLANT
TOWEL OR 17X26 10 PK STRL BLUE (TOWEL DISPOSABLE) ×3 IMPLANT
TUBE ANAEROBIC SPECIMEN COL (MISCELLANEOUS) IMPLANT
TUBE CONNECTING 12'X1/4 (SUCTIONS) ×1
TUBE CONNECTING 12X1/4 (SUCTIONS) ×2 IMPLANT
UNDERPAD 30X30 INCONTINENT (UNDERPADS AND DIAPERS) ×3 IMPLANT
WATER STERILE IRR 1000ML POUR (IV SOLUTION) ×3 IMPLANT
YANKAUER SUCT BULB TIP NO VENT (SUCTIONS) ×3 IMPLANT

## 2015-05-13 NOTE — H&P (Signed)
History and Physical    Christian Newton [id number] DOB: 1960/08/27 DOA: 05/13/2015  Referring MD/NP/PA:  Charlotte Crumb, MD  PCP:  No PCP Outpatient Specialists: None Patient coming from:  Chief Complaint: Cat bite  HPI: Christian Newton is a 55 y.o. male with medical history significant for COPD / ongoing smokeing and ETOH abuse, sober for two years. Patient works at the [employer] and on 05/11/15 he was bitten on left hand by a cat. A few hours later patient's left hand became numb. He subsequently developed developed chills and significant swelling and erythema of the left hand . He was seen in the ED later that evening . Patient was seen in the ED. EDP spoke with Dr. Burney Gauze. He was given Unasyn and office appt. Patient did not improve, he is going for I&D this am. We have been asked to admit for IV antibiotics and general medical care.   Patient has been bitten by cats before, never had this type of reaction. The cats musician history is unknown. The cat is still being held at the animal shelter. Patient has no other complaints at this time.   ED Course: N/A  Review of Systems:  No unusual weight loss. No cough. No SOB. All other ROS reviewed and negative.   Past Medical History  Diagnosis Date  . Smoker     Past Surgical History  Procedure Laterality Date  . Appendectomy       reports that he has been smoking Cigarettes.  He has a 20 pack-year smoking history. He does not have any smokeless tobacco history on file. He reports that he does not drink alcohol or use illicit drugs.  No Known Allergies FM[facility]:  mother with history of CVA. Patient doesn't know any other [facility]  Prior to Admission medications   Medication Sig Start Date End Date Taking? Authorizing Provider  acetaminophen (TYLENOL) 500 MG tablet Take 500 mg by mouth every 6 (six) hours as needed for mild pain.    Yes Historical Provider, MD  amoxicillin-clavulanate (AUGMENTIN) 875-125 MG tablet Take 1 tablet  by mouth every 12 (twelve) hours. 05/12/15  Yes Hannah Muthersbaugh, PA-C  oxyCODONE-acetaminophen (PERCOCET) 5-325 MG tablet Take 1-2 tablets by mouth every 4 (four) hours as needed. 05/12/15  Yes Hannah Muthersbaugh, PA-C  naproxen sodium (ANAPROX) 220 MG tablet Take 220 mg by mouth 2 (two) times daily as needed (for pain).    Historical Provider, MD    Physical Exam: Filed Vitals:   05/13/15 1114  BP: 145/87  Pulse: 108  Temp: 98.6 F (37 C)  TempSrc: Oral  Resp: 20  Height: 5\' 8"  (1.727 m)  Weight: 76.97 kg (169 lb 11 oz)  SpO2: 100%    Constitutional: NAD, calm, comfortable Filed Vitals:   05/13/15 1114  BP: 145/87  Pulse: 108  Temp: 98.6 F (37 C)  TempSrc: Oral  Resp: 20  Height: 5\' 8"  (1.727 m)  Weight: 76.97 kg (169 lb 11 oz)  SpO2: 100%   Eyes: PER, lids and conjunctivae normal ENMT: Mucous membranes are moist. Posterior pharynx clear of any exudate or lesions.Normal dentition.  Neck: normal, supple, no masses, no thyromegaly Respiratory: clear to auscultation bilaterally, no wheezing, no crackles. Normal respiratory effort. No accessory muscle use.  Cardiovascular: Regular rate and rhythm, no murmurs / rubs / gallops. No extremity edema. 2+ pedal pulses. No carotid bruits.  Abdomen: no tenderness, no masses palpated. No hepatosplenomegaly. Bowel sounds positive.  Musculoskeletal: no clubbing / cyanosis. No joint deformity upper  and lower extremities. Good ROM, no contractures. Normal muscle tone.  Skin: Left hand / wrist with marked swelling / erythema. Three non-draining tooth bites seen. Red streaks going up wrist Neurologic: CN 2-12 grossly intact. Sensation intact, DTR normal. Strength 5/5 in all 4.  Psychiatric: Normal judgment and insight. Alert and oriented x 3. Normal mood.   Labs on Admission: I have personally reviewed following labs and imaging studies  CBC:  Recent Labs Lab 05/11/15 2135 05/11/15 2337  WBC 16.3*  --   HGB 13.8 14.3  HCT 41.3  42.0  MCV 79.3  --   PLT 173  --    Basic Metabolic Panel:  Recent Labs Lab 05/11/15 2135 05/11/15 2337  NA 137 140  K 5.9* 3.9  CL 104 105  CO2 21*  --   GLUCOSE 105* 110*  BUN 13 13  CREATININE 0.99 0.90  CALCIUM 9.2  --    GUrine analysis:    Component Value Date/Time   COLORURINE YELLOW 10/20/2014 1838   APPEARANCEUR CLEAR 10/20/2014 1838   LABSPEC 1.017 10/20/2014 1838   PHURINE 5.0 10/20/2014 1838   GLUCOSEU NEGATIVE 10/20/2014 1838   HGBUR MODERATE* 10/20/2014 1838   BILIRUBINUR NEGATIVE 10/20/2014 1838   KETONESUR 15* 10/20/2014 1838   PROTEINUR >300* 10/20/2014 1838   UROBILINOGEN 1.0 10/20/2014 1838   NITRITE NEGATIVE 10/20/2014 1838   LEUKOCYTESUR NEGATIVE 10/20/2014 1838    Recent Results (from the past 240 hour(s))  Blood culture (routine x 2)     Status: None (Preliminary result)   Collection Time: 05/11/15  9:30 PM  Result Value Ref Range Status   Specimen Description BLOOD RIGHT FOREARM  Final   Special Requests BOTTLES DRAWN AEROBIC ONLY 6CC  Final   Culture NO GROWTH < 24 HOURS  Final   Report Status PENDING  Incomplete  Blood culture (routine x 2)     Status: None (Preliminary result)   Collection Time: 05/11/15  9:35 PM  Result Value Ref Range Status   Specimen Description BLOOD RIGHT ANTECUBITAL  Final   Special Requests BOTTLES DRAWN AEROBIC AND ANAEROBIC 5CC  Final   Culture NO GROWTH < 24 HOURS  Final   Report Status PENDING  Incomplete     Radiological Exams on Admission: Dg Hand Complete Left  05/11/2015  CLINICAL DATA:  55 year old male with injury to the left hand by a cat this morning, now with pain and swelling. EXAM: LEFT HAND - COMPLETE 3+ VIEW COMPARISON:  None. FINDINGS: No retained radiopaque foreign body in the soft tissues. No acute displaced fracture, subluxation or dislocation. 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 IMPRESSION: 1. No acute radiographic abnormality of the left hand. Electronically  Signed   By: Vinnie Langton M.D.   On: 05/11/2015 21:29    Assessment/Plan   Cat bite / cellulitis left hand. -Admit to Medical Bed  -Unasyn. Discussed with ID.  -Tetanus vaccine -No need for rabies vaccination at this time. Cat is still at [place] and patient advised that the Cat needs to be observed  for a few days.  -analgesics prn  COPD. Stable. Not requiring treatment at home -Duonebs Q4 hours prn. Give dose now (pre-op)  Tobacco abuse.  RN to provide smoking cessation education at discharge   DVT prophylaxis: SCDs today, start Lovenox in am.  Code Status: Full code Family Communication: Patient alert, oriented and understands plan of treatment  Disposition Plan:  Home in 24-48 hours Consults called: Spoke to Lita Mains, MD  of Infectious Disease. Admission status: Inpatient - Medical Bed   Tye Savoy MD [facility] Pager [phone]  If 7PM-7AM, please contact night-coverage [email or web address] Password [facility]  05/13/2015, 12:14 PM

## 2015-05-13 NOTE — Progress Notes (Signed)
Pts temp 102.3. Pt given 650 tylenol and did IS x 10 up to the 2000 mark. [facility] on call notified. Nursing will continue to monitor.

## 2015-05-13 NOTE — Anesthesia Preprocedure Evaluation (Addendum)
Anesthesia Evaluation  Patient identified by MRN, date of birth, ID band Patient awake    Reviewed: Allergy & Precautions, NPO status , Patient's Chart, lab work & pertinent test results  Airway Mallampati: II  TM Distance: >3 FB Neck ROM: Full    Dental  (+) Dental Advisory Given, Edentulous Upper, Edentulous Lower   Pulmonary COPD, Current Smoker,    Pulmonary exam normal breath sounds clear to auscultation       Cardiovascular Exercise Tolerance: Good negative cardio ROS Normal cardiovascular exam Rhythm:Regular Rate:Normal     Neuro/Psych negative neurological ROS     GI/Hepatic negative GI ROS, Neg liver ROS,   Endo/Other  negative endocrine ROS  Renal/GU negative Renal ROS     Musculoskeletal negative musculoskeletal ROS (+)   Abdominal   Peds  Hematology negative hematology ROS (+)   Anesthesia Other Findings Day of surgery medications reviewed with the patient.  Reproductive/Obstetrics                           Anesthesia Physical Anesthesia Plan  ASA: III  Anesthesia Plan: General   Post-op Pain Management:    Induction: Intravenous  Airway Management Planned: LMA  Additional Equipment:   Intra-op Plan:   Post-operative Plan: Extubation in OR  Informed Consent: I have reviewed the patients History and Physical, chart, labs and discussed the procedure including the risks, benefits and alternatives for the proposed anesthesia with the patient or authorized representative who has indicated his/her understanding and acceptance.   Dental advisory given  Plan Discussed with: CRNA  Anesthesia Plan Comments: (Risks/benefits of general anesthesia discussed with patient including risk of damage to teeth, lips, gum, and tongue, nausea/vomiting, allergic reactions to medications, and the possibility of heart attack, stroke and death.  All patient questions answered.  Patient  wishes to proceed.)        Anesthesia Quick Evaluation

## 2015-05-13 NOTE — H&P (Signed)
Christian Newton is an 55 y.o. male.   Chief Complaint: left hand pain and swelling HPI: as above s/p cat bite 48 hours ago with increasing pain and swelling despite po abx  Past Medical History  Diagnosis Date  . Smoker     Past Surgical History  Procedure Laterality Date  . Appendectomy      No family history on file. Social History:  reports that he has been smoking Cigarettes.  He has a 20 pack-year smoking history. He does not have any smokeless tobacco history on file. He reports that he does not drink alcohol or use illicit drugs.  Allergies: No Known Allergies  Medications Prior to Admission  Medication Sig Dispense Refill  . acetaminophen (TYLENOL) 500 MG tablet Take 500 mg by mouth every 6 (six) hours as needed for mild pain.     Marland Kitchen amoxicillin-clavulanate (AUGMENTIN) 875-125 MG tablet Take 1 tablet by mouth every 12 (twelve) hours. 14 tablet 0  . oxyCODONE-acetaminophen (PERCOCET) 5-325 MG tablet Take 1-2 tablets by mouth every 4 (four) hours as needed. 10 tablet 0  . naproxen sodium (ANAPROX) 220 MG tablet Take 220 mg by mouth 2 (two) times daily as needed (for pain).      Results for orders placed or performed during the hospital encounter of 05/13/15 (from the past 48 hour(s))  Surgical pcr screen     Status: Abnormal   Collection Time: 05/13/15 11:49 AM  Result Value Ref Range   MRSA, PCR NEGATIVE NEGATIVE   Staphylococcus aureus POSITIVE (A) NEGATIVE    Comment:        The Xpert SA Assay (FDA approved for NASAL specimens in patients over 39 years of age), is one component of a comprehensive surveillance program.  Test performance has been validated by [facility] for patients greater than or equal to 48 year old. It is not intended to diagnose infection nor to guide or monitor treatment.    Dg Hand Complete Left  05/11/2015  CLINICAL DATA:  55 year old male with injury to the left hand by a cat this morning, now with pain and swelling. EXAM: LEFT HAND -  COMPLETE 3+ VIEW COMPARISON:  None. FINDINGS: No retained radiopaque foreign body in the soft tissues. No acute displaced fracture, subluxation or dislocation. 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 IMPRESSION: 1. No acute radiographic abnormality of the left hand. Electronically Signed   By: Vinnie Langton M.D.   On: 05/11/2015 21:29    Review of Systems  All other systems reviewed and are negative.   Blood pressure 145/87, pulse 108, temperature 98.6 F (37 C), temperature source Oral, resp. rate 16, height 5\' 8"  (1.727 m), weight 76.97 kg (169 lb 11 oz), SpO2 98 %. Physical Exam  Constitutional: He is oriented to person, place, and time. He appears well-developed and well-nourished.  HENT:  Head: Normocephalic and atraumatic.  Neck: Normal range of motion.  Cardiovascular: Normal rate.   Musculoskeletal:       Left hand: He exhibits tenderness and swelling.  S/p cat bite times 2 48 hours ago with dorsal swelling despite po abx  Neurological: He is alert and oriented to person, place, and time.  Skin: Skin is warm. There is erythema.  Psychiatric: He has a normal mood and affect. His behavior is normal. Judgment and thought content normal.     Assessment/Plan As above  Plan I and D above  Schuyler Amor, MD 05/13/2015, 1:54 PM

## 2015-05-13 NOTE — Transfer of Care (Signed)
Immediate Anesthesia Transfer of Care Note  Patient: Christian Newton  Procedure(s) Performed: Procedure(s): INCISION AND DRAINAGE OF LEFT EXTREMITY (Left)  Patient Location: PACU  Anesthesia Type:General  Level of Consciousness: awake, alert , oriented and patient cooperative  Airway & Oxygen Therapy: Patient Spontanous Breathing and Patient connected to nasal cannula oxygen  Post-op Assessment: Report given to RN and Post -op Vital signs reviewed and stable  Post vital signs: Reviewed and stable  Last Vitals:  Filed Vitals:   05/13/15 1114 05/13/15 1320  BP: 145/87   Pulse: 108 108  Temp: 37 C   Resp: 20 16    Complications: No apparent anesthesia complications

## 2015-05-13 NOTE — Op Note (Signed)
See note [id number]

## 2015-05-13 NOTE — Anesthesia Postprocedure Evaluation (Signed)
Anesthesia Post Note  Patient: Christian Newton  Procedure(s) Performed: Procedure(s) (LRB): INCISION AND DRAINAGE OF LEFT EXTREMITY (Left)  Patient location during evaluation: PACU Anesthesia Type: General Level of consciousness: awake and alert Pain management: pain level controlled Vital Signs Assessment: post-procedure vital signs reviewed and stable Respiratory status: spontaneous breathing, nonlabored ventilation, respiratory function stable and patient connected to nasal cannula oxygen Cardiovascular status: blood pressure returned to baseline and stable Postop Assessment: no signs of nausea or vomiting Anesthetic complications: no    Last Vitals:  Filed Vitals:   05/13/15 1545 05/13/15 1550  BP: 137/92 141/87  Pulse: 96 97  Temp:  37.4 C  Resp: 14 16    Last Pain:  Filed Vitals:   05/13/15 1554  PainSc: Palmer Edward Turk

## 2015-05-13 NOTE — Anesthesia Procedure Notes (Signed)
Procedure Name: LMA Insertion Date/Time: 05/13/2015 2:08 PM Performed by: Layla Maw Pre-anesthesia Checklist: Patient identified, Patient being monitored, Timeout performed, Emergency Drugs available and Suction available Patient Re-evaluated:Patient Re-evaluated prior to inductionOxygen Delivery Method: Circle System Utilized Preoxygenation: Pre-oxygenation with 100% oxygen Intubation Type: IV induction Ventilation: Mask ventilation without difficulty LMA: LMA inserted LMA Size: 5.0 Number of attempts: 1 Placement Confirmation: positive ETCO2 and breath sounds checked- equal and bilateral Tube secured with: Tape Dental Injury: Teeth and Oropharynx as per pre-operative assessment

## 2015-05-13 NOTE — Progress Notes (Signed)
Pts wallet with its contents brought to security.

## 2015-05-14 DIAGNOSIS — L03114 Cellulitis of left upper limb: Principal | ICD-10-CM

## 2015-05-14 DIAGNOSIS — L02512 Cutaneous abscess of left hand: Secondary | ICD-10-CM

## 2015-05-14 DIAGNOSIS — W5501XD Bitten by cat, subsequent encounter: Secondary | ICD-10-CM

## 2015-05-14 DIAGNOSIS — Z720 Tobacco use: Secondary | ICD-10-CM

## 2015-05-14 LAB — BASIC METABOLIC PANEL
Anion gap: 9 (ref 5–15)
BUN: 9 mg/dL (ref 6–20)
CO2: 22 mmol/L (ref 22–32)
CREATININE: 0.96 mg/dL (ref 0.61–1.24)
Calcium: 8.3 mg/dL — ABNORMAL LOW (ref 8.9–10.3)
Chloride: 105 mmol/L (ref 101–111)
GFR calc Af Amer: >60 mL/min (ref >60)
Glucose, Bld: 103 mg/dL — ABNORMAL HIGH (ref 65–99)
POTASSIUM: 3.8 mmol/L (ref 3.5–5.1)
SODIUM: 136 mmol/L (ref 135–145)

## 2015-05-14 LAB — CBC
HEMATOCRIT: 37.7 % — AB (ref 39.0–52.0)
Hemoglobin: 12.5 g/dL — ABNORMAL LOW (ref 13.0–17.0)
MCH: 26.3 pg (ref 26.0–34.0)
MCHC: 33.2 g/dL (ref 30.0–36.0)
MCV: 79.2 fL (ref 78.0–100.0)
PLATELETS: 155 10*3/uL (ref 150–400)
RBC: 4.76 MIL/uL (ref 4.22–5.81)
RDW: 13.8 % (ref 11.5–15.5)
WBC: 10.4 10*3/uL (ref 4.0–10.5)

## 2015-05-14 NOTE — Op Note (Signed)
NAMEJUERGEN, SEBREE NO.:  [id number]  MEDICAL RECORD NO.:  [id number]  LOCATION:  [id number]                        FACILITY:  [facility]  PHYSICIAN:  Sheral Apley. Jerilee Space, M.D.DATE OF BIRTH:  1960-10-21  DATE OF PROCEDURE:  05/13/2015 DATE OF DISCHARGE:                              OPERATIVE REPORT   PREOPERATIVE DIAGNOSIS:  Infected cat bite, left hand.  POSTOPERATIVE DIAGNOSIS:  Infected cat bite, left hand.  PROCEDURE:  Incision and drainage, left hand x2.  SURGEON:  Sheral Apley. Burney Gauze, MD  ASSISTANT:  None.  ANESTHESIA:  General.  COMPLICATION:  No complication.  DRAINS:  No drains.  PROCEDURE IN DETAIL:  Patient was taken to the operative suite.  After induction of adequate general anesthetic, left upper extremity was prepped and draped in sterile fashion.  Esmarch was used to examine the limb.  Tourniquet was inflated to 250 mmHg.  At this point in time, incision was made over bite mark over the left thumb in the area of the metacarpal base, in the thenar area.  Skin was incised longitudinally for 3 cm.  Dissection was carried down to the 1st dorsal compartment. Purulence was encountered, was cultured for aerobic, anaerobic, and stat Gram stain.  We made a 2nd incision over the dorsum of the hand over bite between the long and index metacarpal.  Skin was incised 3-4 cm. Purulence was encountered, cultures for aerobic, anaerobic, and stat Gram stain.  We thoroughly dissected the dorsum of the hand bluntly.  We then irrigated with 4 L normal saline throughout both wounds until the irrigant was clear.  We then packed the wounds open with quarter-inch iodoform gauze on each wound.  We then dressed with 4x4, fluffs, and compression wrap.  The patient tolerated procedure well in a concealed fashion.     Sheral Apley Burney Gauze, M.D.     MAW/MEDQ  D:  05/13/2015  T:  05/14/2015  Job:  [id number]

## 2015-05-14 NOTE — Progress Notes (Signed)
Patient with normal WBC and clinical improvement  Would d/c on po augmentin 875 bid and local wound care  Will see in my office Monday afternoon

## 2015-05-14 NOTE — Progress Notes (Signed)
Physical Therapy Wound Evaluation/Treatment Patient Details  Name: Christian Newton MRN: [id number] Date of Birth: 1960-04-14  Today's Date: 05/14/2015 Time: 1020-1112 Time Calculation (min): 52 min  Subjective  Subjective: Pt states he is in pain but is agreeable to proceed with hydrotherapy as he has already had pain medicine.  Patient and Family Stated Goals: Heal wound, return home Date of Onset: 05/11/15  Pain Score: Pt was premedicated prior to session however still reports pain throughout session.   Wound Assessment  Wound / Incision (Open or Dehisced) 05/14/15 Incision - Open Hand Left Dorsal (Active)  Dressing Type ABD;Compression wrap;Moist to dry;Gauze (Comment) 05/14/2015 11:20 AM  Dressing Changed Changed 05/14/2015 11:20 AM  Dressing Status Clean;Dry;Intact 05/14/2015 11:20 AM  Dressing Change Frequency Daily 05/14/2015 11:20 AM  Site / Wound Assessment Red 05/14/2015 11:20 AM  % Wound base Red or Granulating 100% 05/14/2015 11:20 AM  Peri-wound Assessment Intact;Induration;Edema 05/14/2015 11:20 AM  Wound Length (cm) 4.6 cm 05/14/2015 11:20 AM  Wound Width (cm) 1.2 cm 05/14/2015 11:20 AM  Wound Depth (cm) 0.3 cm 05/14/2015 11:20 AM  Undermining (cm) 3.7 cm 7-12 o'clock, 1.2 cm 12-3 o'clock 05/14/2015 11:20 AM  Margins Unattached edges (unapproximated) 05/14/2015 11:20 AM  Closure None 05/14/2015 11:20 AM  Drainage Amount Moderate 05/14/2015 11:20 AM  Drainage Description Sanguineous 05/14/2015 11:20 AM  Treatment Hydrotherapy (Pulse lavage);Packing (Plain strip) 05/14/2015 11:20 AM     Wound / Incision (Open or Dehisced) 05/14/15 Incision - Open Hand Left Radial (Active)  Dressing Type ABD;Compression wrap;Moist to dry;Gauze (Comment) 05/14/2015 11:20 AM  Dressing Changed Changed 05/14/2015 11:20 AM  Dressing Status Clean;Dry;Intact 05/14/2015 11:20 AM  Dressing Change Frequency Daily 05/14/2015 11:20 AM  Site / Wound Assessment Red 05/14/2015 11:20 AM  % Wound base Red or Granulating  100% 05/14/2015 11:20 AM  Peri-wound Assessment Intact;Edema;Induration 05/14/2015 11:20 AM  Wound Length (cm) 3.5 cm 05/14/2015 11:20 AM  Wound Width (cm) 1 cm 05/14/2015 11:20 AM  Wound Depth (cm) 0.8 cm 05/14/2015 11:20 AM  Undermining (cm) 0.4 cm 12-2 o'clock, 3.9 cm 9-12 o'clock, 1.0 cm 6-9 o'clock 05/14/2015 11:20 AM  Margins Unattached edges (unapproximated) 05/14/2015 11:20 AM  Closure None 05/14/2015 11:20 AM  Drainage Amount Moderate 05/14/2015 11:20 AM  Drainage Description Sanguineous 05/14/2015 11:20 AM  Treatment Hydrotherapy (Pulse lavage);Packing (Plain strip) 05/14/2015 11:20 AM   Hydrotherapy Pulsed lavage therapy - wound location: dorsal and radial wound sites Pulsed Lavage with Suction (psi): 8 psi (started with 4 due to pain) Pulsed Lavage with Suction - Normal Saline Used: 1000 mL Pulsed Lavage Tip: Tip with splash shield   Wound Assessment and Plan  Wound Therapy - Assess/Plan/Recommendations Wound Therapy - Clinical Statement: Pt presents to hydrotherapy s/p I&D of a cat bite. Pt will benefit from continued hydrotherapy to promote wound bed healing and to decrease bioburden.  Wound Therapy - Functional Problem List: Decreased AROM and acute pain of L hand Factors Delaying/Impairing Wound Healing: Tobacco use Hydrotherapy Plan: Debridement;Dressing change;Patient/family education;Pulsatile lavage with suction Wound Therapy - Frequency: 6X / week Wound Therapy - Follow Up Recommendations: Home health RN Wound Plan: See above  Wound Therapy Goals- Improve the function of patient's integumentary system by progressing the wound(s) through the phases of wound healing (inflammation - proliferation - remodeling) by: Patient/Family will be able to : manage dressing changes independently by d/c. Patient/Family Instruction Goal - Progress: Goal set today Time For Goal Achievement: 7 days Wound Therapy - Potential for Goals: Excellent  Goals will be updated until  maximal potential  achieved or discharge criteria met.  Discharge criteria: when goals achieved, discharge from hospital, MD decision/surgical intervention, no progress towards goals, refusal/missing three consecutive treatments without notification or medical reason.  GP     Rolinda Roan 05/14/2015, 11:34 AM   Rolinda Roan, PT, DPT Acute Rehabilitation Services Pager: [phone]

## 2015-05-14 NOTE — Progress Notes (Signed)
PROGRESS NOTE                                                                                                                                                                                                             Patient Demographics:    Christian Newton, is a 55 y.o. male, DOB - 06/07/1960, [id number]  Admit date - 05/13/2015   Admitting Physician Charlotte Crumb, MD  Outpatient Primary MD for the patient is No primary care provider on file.  LOS -   Outpatient Specialists: none  Cat bite left hand      Brief Narrative   54 y/o male with tobacco abuse, COPD admitted with a cat bite on his left hand while working at the [employer]. patient seen by hand surgeon and underwent I&D of left hand abscess.   Subjective:   C/o pain over left hand 6/10. Febrile to 102 F.   Assessment  & Plan :    Active Problems:   Cat bite Cellulitis with abscess of left hand S/p I&D. continue empiric unasyn. Pain control with vicodin. Keep hand elevated. Hand surgeon will evaluate further and recommend if needs further intervention. Tylenol for fever. Tdap given and cat is reportedly under custody for observation.     COPD (chronic obstructive pulmonary disease) (HCC) Not on home meds. Prn nebs here.    Tobacco abuse Counseled on cessation  Hyperkalemia On admission. Resolved.     Code Status : full code  Family Communication  : none at bedside  Disposition Plan  : home once cleared by hand surgeon , possibly tomorrow  Barriers For Discharge :   Consults  :  Hand surgery ( Dr Burney Gauze)  Procedures  : I&D left hand  DVT Prophylaxis  :  Lovenox -  Lab Results  Component Value Date   PLT 155 05/14/2015    Antibiotics  :  unasyn 4/21--  Anti-infectives    Start     Dose/Rate Route Frequency Ordered Stop   05/13/15 1400  Ampicillin-Sulbactam (UNASYN) 3 g in sodium chloride 0.9 % 100 mL IVPB     3  g 100 mL/hr over 60 Minutes Intravenous Every 6 hours 05/13/15 1358          Objective:   Filed Vitals:   05/13/15 1625 05/13/15 2100 05/13/15 2300 05/14/15 0500  BP:  148/90 135/73 138/70 133/78  Pulse: 102 125 121 109  Temp: 98.9 F (37.2 C) 102.3 F (39.1 C) 100 F (37.8 C) 99 F (37.2 C)  TempSrc:  Oral Oral Oral  Resp: 16 16 16 16   Height:      Weight:      SpO2: 97% 90% 98% 95%    Wt Readings from Last 3 Encounters:  05/13/15 76.97 kg (169 lb 11 oz)  05/11/15 76.975 kg (169 lb 11.2 oz)  10/21/14 77.3 kg (170 lb 6.7 oz)     Intake/Output Summary (Last 24 hours) at 05/14/15 0935 Last data filed at 05/14/15 0700  Gross per 24 hour  Intake   1140 ml  Output   1405 ml  Net   -265 ml     Physical Exam  Gen: not in distress HEENT:moist mucosa, supple neck Chest: clear b/l, no added sounds CVS: N S1&S2, no murmurs,  GI: soft, NT, ND, BS+ Musculoskeletal: warm, dressing with ACE wrap over left hand, swollen fingers, has good ROM     Data Review:    CBC  Recent Labs Lab 05/11/15 2135 05/11/15 2337 05/14/15 0708  WBC 16.3*  --  10.4  HGB 13.8 14.3 12.5*  HCT 41.3 42.0 37.7*  PLT 173  --  155  MCV 79.3  --  79.2  MCH 26.5  --  26.3  MCHC 33.4  --  33.2  RDW 14.3  --  13.8    Chemistries   Recent Labs Lab 05/11/15 2135 05/11/15 2337 05/14/15 0708  NA 137 140 136  K 5.9* 3.9 3.8  CL 104 105 105  CO2 21*  --  22  GLUCOSE 105* 110* 103*  BUN 13 13 9   CREATININE 0.99 0.90 0.96  CALCIUM 9.2  --  8.3*   ------------------------------------------------------------------------------------------------------------------ No results for input(s): CHOL, HDL, LDLCALC, TRIG, CHOLHDL, LDLDIRECT in the last 72 hours.  Lab Results  Component Value Date   HGBA1C 6.1* 04/16/2014   ------------------------------------------------------------------------------------------------------------------ No results for input(s): TSH, T4TOTAL, T3FREE, THYROIDAB  in the last 72 hours.  Invalid input(s): FREET3 ------------------------------------------------------------------------------------------------------------------ No results for input(s): VITAMINB12, FOLATE, FERRITIN, TIBC, IRON, RETICCTPCT in the last 72 hours.  Coagulation profile No results for input(s): INR, PROTIME in the last 168 hours.  No results for input(s): DDIMER in the last 72 hours.  Cardiac Enzymes No results for input(s): CKMB, TROPONINI, MYOGLOBIN in the last 168 hours.  Invalid input(s): CK ------------------------------------------------------------------------------------------------------------------ No results found for: BNP  Inpatient Medications  Scheduled Meds: . ampicillin-sulbactam (UNASYN) IV  3 g Intravenous Q6H  . enoxaparin (LOVENOX) injection  40 mg Subcutaneous Q24H  . Tdap  0.5 mL Intramuscular Once   Continuous Infusions: . sodium chloride 75 mL/hr at 05/14/15 0119  . lactated ringers     PRN Meds:.acetaminophen **OR** acetaminophen, bisacodyl, HYDROcodone-acetaminophen, ipratropium-albuterol, morphine injection, ondansetron **OR** ondansetron (ZOFRAN) IV, polyethylene glycol  Micro Results Recent Results (from the past 240 hour(s))  Blood culture (routine x 2)     Status: None (Preliminary result)   Collection Time: 05/11/15  9:30 PM  Result Value Ref Range Status   Specimen Description BLOOD RIGHT FOREARM  Final   Special Requests BOTTLES DRAWN AEROBIC ONLY 6CC  Final   Culture NO GROWTH 2 DAYS  Final   Report Status PENDING  Incomplete  Blood culture (routine x 2)     Status: None (Preliminary result)   Collection Time: 05/11/15  9:35 PM  Result Value Ref Range Status   Specimen  Description BLOOD RIGHT ANTECUBITAL  Final   Special Requests BOTTLES DRAWN AEROBIC AND ANAEROBIC 5CC  Final   Culture NO GROWTH 2 DAYS  Final   Report Status PENDING  Incomplete  Surgical pcr screen     Status: Abnormal   Collection Time: 05/13/15 11:49 AM   Result Value Ref Range Status   MRSA, PCR NEGATIVE NEGATIVE Final   Staphylococcus aureus POSITIVE (A) NEGATIVE Final    Comment:        The Xpert SA Assay (FDA approved for NASAL specimens in patients over 27 years of age), is one component of a comprehensive surveillance program.  Test performance has been validated by [facility] for patients greater than or equal to 10 year old. It is not intended to diagnose infection nor to guide or monitor treatment.   Wound culture     Status: None (Preliminary result)   Collection Time: 05/13/15  3:44 PM  Result Value Ref Range Status   Specimen Description WOUND LEFT HAND  Final   Special Requests NONE  Final   Gram Stain   Final    ABUNDANT WBC PRESENT,BOTH PMN AND MONONUCLEAR NO SQUAMOUS EPITHELIAL CELLS SEEN NO ORGANISMS SEEN Performed at [facility] Performed at [place] PENDING  Incomplete   Report Status PENDING  Incomplete  Anaerobic culture     Status: None (Preliminary result)   Collection Time: 05/13/15  3:53 PM  Result Value Ref Range Status   Specimen Description WOUND LEFT THUMB  Final   Special Requests NONE  Final   Gram Stain   Final    NO WBC SEEN NO SQUAMOUS EPITHELIAL CELLS SEEN NO ORGANISMS SEEN Performed at [facility] Performed at [place] PENDING  Incomplete   Report Status PENDING  Incomplete  Anaerobic culture     Status: None (Preliminary result)   Collection Time: 05/13/15  3:54 PM  Result Value Ref Range Status   Specimen Description WOUND LEFT HAND  Final   Special Requests NONE  Final   Gram Stain   Final    ABUNDANT WBC PRESENT,BOTH PMN AND MONONUCLEAR NO SQUAMOUS EPITHELIAL CELLS SEEN NO ORGANISMS SEEN Performed at [facility] Performed at [facility]    Culture PENDING  Incomplete   Report Status PENDING  Incomplete  Gram stain     Status: None   Collection Time: 05/13/15  3:54 PM  Result Value Ref  Range Status   Specimen Description WOUND LEFT THUMB  Final   Special Requests NONE  Final   Gram Stain   Final    DEGENERATED CELLULAR MATERIAL PRESENT NO ORGANISMS SEEN    Report Status 05/13/2015 FINAL  Final  Wound culture     Status: None (Preliminary result)   Collection Time: 05/13/15  3:54 PM  Result Value Ref Range Status   Specimen Description WOUND LEFT THUMB  Final   Special Requests NONE  Final   Gram Stain   Final    NO WBC SEEN NO SQUAMOUS EPITHELIAL CELLS SEEN NO ORGANISMS SEEN Performed at [facility] Performed at [facility]    Culture PENDING  Incomplete   Report Status PENDING  Incomplete  Gram stain     Status: None   Collection Time: 05/13/15  3:55 PM  Result Value Ref Range Status   Specimen Description WOUND LEFT HAND  Final   Special Requests NONE  Final   Gram Stain  Final    ABUNDANT WBC PRESENT,BOTH PMN AND MONONUCLEAR NO ORGANISMS SEEN    Report Status 05/13/2015 FINAL  Final    Radiology Reports Dg Hand Complete Left  05/11/2015  CLINICAL DATA:  55 year old male with injury to the left hand by a cat this morning, now with pain and swelling. EXAM: LEFT HAND - COMPLETE 3+ VIEW COMPARISON:  None. FINDINGS: No retained radiopaque foreign body in the soft tissues. No acute displaced fracture, subluxation or dislocation. 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 IMPRESSION: 1. No acute radiographic abnormality of the left hand. Electronically Signed   By: Vinnie Langton M.D.   On: 05/11/2015 21:29    Time Spent in minutes  25   Louellen Molder M.D on 05/14/2015 at 9:35 AM  Between 7am to 7pm - Pager - [phone]  After 7pm go to [email or web address] - password [facility]  [facility] -  Office  [phone]

## 2015-05-15 DIAGNOSIS — E875 Hyperkalemia: Secondary | ICD-10-CM

## 2015-05-15 DIAGNOSIS — L03114 Cellulitis of left upper limb: Secondary | ICD-10-CM | POA: Diagnosis present

## 2015-05-15 LAB — CBC
HCT: 35.7 % — ABNORMAL LOW (ref 39.0–52.0)
Hemoglobin: 10.8 g/dL — ABNORMAL LOW (ref 13.0–17.0)
MCH: 23.6 pg — ABNORMAL LOW (ref 26.0–34.0)
MCHC: 30.3 g/dL (ref 30.0–36.0)
MCV: 78.1 fL (ref 78.0–100.0)
Platelets: 140 10*3/uL — ABNORMAL LOW (ref 150–400)
RBC: 4.57 MIL/uL (ref 4.22–5.81)
RDW: 14.1 % (ref 11.5–15.5)
WBC: 7.4 10*3/uL (ref 4.0–10.5)

## 2015-05-15 NOTE — Discharge Summary (Signed)
Physician Discharge Summary  Christian Newton [id number] DOB: [birth year] DOA: 05/13/2015  PCP: No primary care provider on file.  Admit date: 05/13/2015 Discharge date: 05/15/2015  Time spent: 25 minutes  Recommendations for Outpatient Follow-up:  Discharge home on oral augmentin for  7 days. Follow with Dr Burney Gauze in office tomorrow.  Discharge Diagnoses:  Principal Problem:   Cellulitis of left hand   Active Problems:   Cat bite   COPD (chronic obstructive pulmonary disease) (HCC)   Tobacco abuse   Hyperkalemia   Discharge Condition: fair  Diet recommendation: regular  Filed Weights   05/13/15 1114  Weight: 76.97 kg (169 lb 11 oz)    History of present illness:  55 y/o male with tobacco abuse, COPD admitted with a cat bite on his left hand while working at the [employer]. He was seen in the ED and given a seven-day course of Augmentin along with Percocet for pain. He returned back to the ED as symptoms got worse. patient seen by hand surgeon and underwent I&D of left hand abscess.  Hospital Course:  Active Problems:  Cat bite Cellulitis with abscess of left hand S/p I&D. Received empiric Unasyn. Pain control with vicodin. Received hydrotherapy on 4/22 and 4/23. Followed by Dr. Burney Gauze and recommended stable for discharge with outpatient follow-up with him tomorrow. Tdap given and cat is reportedly under custody for observation. Patient was prescribed seven-day course of Augmentin on 4/21 and also given when necessary Percocet from the ED. He reports taking only 1 dose of Augmentin and is advised to complete the course.   COPD (chronic obstructive pulmonary disease) (HCC) Not on home meds. Received Prn nebs here.   Tobacco abuse Counseled on cessation  Hyperkalemia On admission. Resolved.  Discharge Exam: Filed Vitals:   05/14/15 2039 05/15/15 0704  BP: 138/86 154/95  Pulse: 98 101  Temp: 98.8 F (37.1 C) 98.9 F (37.2 C)  Resp: 16 16    Gen:  not in distress HEENT:moist mucosa, supple neck Chest: clear b/l, no added sounds CVS: N S1&S2, no murmurs,  GI: soft, NT, ND, BS+ Musculoskeletal: warm, dressing with ACE wrap over left hand, swollen fingers, has good ROM  Discharge Instructions    Current Discharge Medication List    CONTINUE these medications which have NOT CHANGED   Details  acetaminophen (TYLENOL) 500 MG tablet Take 500 mg by mouth every 6 (six) hours as needed for mild pain.     amoxicillin-clavulanate (AUGMENTIN) 875-125 MG tablet Take 1 tablet by mouth every 12 (twelve) hours. Qty: 14 tablet, Refills: 0    oxyCODONE-acetaminophen (PERCOCET) 5-325 MG tablet Take 1-2 tablets by mouth every 4 (four) hours as needed. Qty: 10 tablet, Refills: 0    naproxen sodium (ANAPROX) 220 MG tablet Take 220 mg by mouth 2 (two) times daily as needed (for pain).       No Known Allergies Follow-up Information    Follow up with Schuyler Amor, MD. Call on 05/16/2015.   Specialty:  Orthopedic Surgery   Contact information:   [place] [id number] [phone]        The results of significant diagnostics from this hospitalization (including imaging, microbiology, ancillary and laboratory) are listed below for reference.    Significant Diagnostic Studies: Dg Hand Complete Left  05/11/2015  CLINICAL DATA:  55 year old male with injury to the left hand by a cat this morning, now with pain and swelling. EXAM: LEFT HAND - COMPLETE 3+ VIEW COMPARISON:  None. FINDINGS: No  retained radiopaque foreign body in the soft tissues. No acute displaced fracture, subluxation or dislocation. 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 IMPRESSION: 1. No acute radiographic abnormality of the left hand. Electronically Signed   By: Vinnie Langton M.D.   On: 05/11/2015 21:29    Microbiology: Recent Results (from the past 240 hour(s))  Blood culture (routine x 2)     Status: None (Preliminary result)    Collection Time: 05/11/15  9:30 PM  Result Value Ref Range Status   Specimen Description BLOOD RIGHT FOREARM  Final   Special Requests BOTTLES DRAWN AEROBIC ONLY 6CC  Final   Culture NO GROWTH 3 DAYS  Final   Report Status PENDING  Incomplete  Blood culture (routine x 2)     Status: None (Preliminary result)   Collection Time: 05/11/15  9:35 PM  Result Value Ref Range Status   Specimen Description BLOOD RIGHT ANTECUBITAL  Final   Special Requests BOTTLES DRAWN AEROBIC AND ANAEROBIC 5CC  Final   Culture NO GROWTH 3 DAYS  Final   Report Status PENDING  Incomplete  Surgical pcr screen     Status: Abnormal   Collection Time: 05/13/15 11:49 AM  Result Value Ref Range Status   MRSA, PCR NEGATIVE NEGATIVE Final   Staphylococcus aureus POSITIVE (A) NEGATIVE Final    Comment:        The Xpert SA Assay (FDA approved for NASAL specimens in patients over 3 years of age), is one component of a comprehensive surveillance program.  Test performance has been validated by [facility] for patients greater than or equal to 86 year old. It is not intended to diagnose infection nor to guide or monitor treatment.   Wound culture     Status: None (Preliminary result)   Collection Time: 05/13/15  3:44 PM  Result Value Ref Range Status   Specimen Description WOUND LEFT HAND  Final   Special Requests NONE  Final   Gram Stain   Final    ABUNDANT WBC PRESENT,BOTH PMN AND MONONUCLEAR NO SQUAMOUS EPITHELIAL CELLS SEEN NO ORGANISMS SEEN Performed at [facility] Performed at [facility]    Culture   Final    Culture reincubated for better growth Performed at [facility]    Report Status PENDING  Incomplete  Anaerobic culture     Status: None (Preliminary result)   Collection Time: 05/13/15  3:53 PM  Result Value Ref Range Status   Specimen Description WOUND LEFT THUMB  Final   Special Requests NONE  Final   Gram Stain   Final    NO WBC SEEN NO SQUAMOUS EPITHELIAL  CELLS SEEN NO ORGANISMS SEEN Performed at [facility] Performed at [facility]    Culture PENDING  Incomplete   Report Status PENDING  Incomplete  Anaerobic culture     Status: None (Preliminary result)   Collection Time: 05/13/15  3:54 PM  Result Value Ref Range Status   Specimen Description WOUND LEFT HAND  Final   Special Requests NONE  Final   Gram Stain   Final    ABUNDANT WBC PRESENT,BOTH PMN AND MONONUCLEAR NO SQUAMOUS EPITHELIAL CELLS SEEN NO ORGANISMS SEEN Performed at [facility] Performed at [facility]    Culture PENDING  Incomplete   Report Status PENDING  Incomplete  Gram stain     Status: None   Collection Time: 05/13/15  3:54 PM  Result Value Ref Range Status   Specimen Description  WOUND LEFT THUMB  Final   Special Requests NONE  Final   Gram Stain   Final    DEGENERATED CELLULAR MATERIAL PRESENT NO ORGANISMS SEEN    Report Status 05/13/2015 FINAL  Final  Wound culture     Status: None (Preliminary result)   Collection Time: 05/13/15  3:54 PM  Result Value Ref Range Status   Specimen Description WOUND LEFT THUMB  Final   Special Requests NONE  Final   Gram Stain   Final    NO WBC SEEN NO SQUAMOUS EPITHELIAL CELLS SEEN NO ORGANISMS SEEN Performed at [facility] Performed at [facility]    Culture   Final    NO GROWTH 2 DAYS Performed at [employer]    Report Status PENDING  Incomplete  Gram stain     Status: None   Collection Time: 05/13/15  3:55 PM  Result Value Ref Range Status   Specimen Description WOUND LEFT HAND  Final   Special Requests NONE  Final   Gram Stain   Final    ABUNDANT WBC PRESENT,BOTH PMN AND MONONUCLEAR NO ORGANISMS SEEN    Report Status 05/13/2015 FINAL  Final     Labs: Basic Metabolic Panel:  Recent Labs Lab 05/11/15 2135 05/11/15 2337 05/14/15 0708  NA 137 140 136  K 5.9* 3.9 3.8  CL 104 105 105  CO2 21*  --  22  GLUCOSE 105* 110* 103*  BUN 13 13  9   CREATININE 0.99 0.90 0.96  CALCIUM 9.2  --  8.3*   Liver Function Tests: No results for input(s): AST, ALT, ALKPHOS, BILITOT, PROT, ALBUMIN in the last 168 hours. No results for input(s): LIPASE, AMYLASE in the last 168 hours. No results for input(s): AMMONIA in the last 168 hours. CBC:  Recent Labs Lab 05/11/15 2135 05/11/15 2337 05/14/15 0708 05/15/15 0400  WBC 16.3*  --  10.4 7.4  HGB 13.8 14.3 12.5* 10.8*  HCT 41.3 42.0 37.7* 35.7*  MCV 79.3  --  79.2 78.1  PLT 173  --  155 140*   Cardiac Enzymes: No results for input(s): CKTOTAL, CKMB, CKMBINDEX, TROPONINI in the last 168 hours. BNP: BNP (last 3 results) No results for input(s): BNP in the last 8760 hours.  ProBNP (last 3 results) No results for input(s): PROBNP in the last 8760 hours.  CBG: No results for input(s): GLUCAP in the last 168 hours.     Signed:  Louellen Molder MD.  [facility] 05/15/2015, 9:39 AM

## 2015-05-15 NOTE — Discharge Instructions (Signed)
Cellulitis °Cellulitis is an infection of the skin and the tissue under the skin. The infected area is usually red and tender. This happens most often in the arms and lower legs. °HOME CARE  °· Take your antibiotic medicine as told. Finish the medicine even if you start to feel better. °· Keep the infected arm or leg raised (elevated). °· Put a warm cloth on the area up to 4 times per day. °· Only take medicines as told by your doctor. °· Keep all doctor visits as told. °GET HELP IF: °· You see red streaks on the skin coming from the infected area. °· Your red area gets bigger or turns a dark color. °· Your bone or joint under the infected area is painful after the skin heals. °· Your infection comes back in the same area or different area. °· You have a puffy (swollen) bump in the infected area. °· You have new symptoms. °· You have a fever. °GET HELP RIGHT AWAY IF:  °· You feel very sleepy. °· You throw up (vomit) or have watery poop (diarrhea). °· You feel sick and have muscle aches and pains. °  °This information is not intended to replace advice given to you by your health care provider. Make sure you discuss any questions you have with your health care provider. °  °Document Released: 06/27/2007 Document Revised: 09/29/2014 Document Reviewed: 03/26/2011 °Elsevier Interactive Patient Education ©2016 [employer] ° °

## 2015-05-15 NOTE — Progress Notes (Signed)
Went over discharge papers with pt, explained dr ordered his pain meds on the 4/28 and he will have enough until his next appt pt understoon

## 2015-05-15 NOTE — Progress Notes (Signed)
Taxi voucher provided to pt for transportation.  Creta Levin, LCSW Weekend Coverage [id number]

## 2015-05-15 NOTE — Progress Notes (Signed)
Physical Therapy Wound Treatment Patient Details  Name: Christian Newton MRN: [id number] Date of Birth: May 17, 1960  Today's Date: 05/15/2015 Time: 2703-5009 Time Calculation (min): 23 min  Subjective  Subjective: Patient reports increased pain with hydrotherapy. RN premedicated. Patient and Family Stated Goals: Heal wound, return home Date of Onset: 05/11/15  Pain Score:  8  Wound Assessment  Wound / Incision (Open or Dehisced) 05/14/15 Incision - Open Hand Left Dorsal (Active)  Dressing Type ABD;Compression wrap;Moist to dry;Gauze (Comment) 05/15/2015 10:37 AM  Dressing Changed Changed 05/15/2015 10:37 AM  Dressing Status Clean;Dry;Intact 05/15/2015 10:37 AM  Dressing Change Frequency Daily 05/15/2015 10:37 AM  Site / Wound Assessment Red 05/15/2015 10:37 AM  % Wound base Red or Granulating 100% 05/15/2015 10:37 AM  Peri-wound Assessment Intact;Induration;Edema 05/15/2015 10:37 AM  Margins Unattached edges (unapproximated) 05/15/2015 10:37 AM  Closure None 05/15/2015 10:37 AM  Drainage Amount Moderate 05/15/2015 10:37 AM  Drainage Description Sanguineous 05/15/2015 10:37 AM  Treatment Hydrotherapy (Pulse lavage);Packing (Plain strip) 05/15/2015 10:37 AM     Wound / Incision (Open or Dehisced) 05/14/15 Incision - Open Hand Left Radial (Active)  Dressing Type ABD;Compression wrap;Moist to dry;Gauze (Comment) 05/15/2015 10:37 AM  Dressing Changed Changed 05/15/2015 10:37 AM  Dressing Status Clean;Dry;Intact 05/15/2015 10:37 AM  Dressing Change Frequency Daily 05/15/2015 10:37 AM  Site / Wound Assessment Red 05/15/2015 10:37 AM  % Wound base Red or Granulating 100% 05/15/2015 10:37 AM  Peri-wound Assessment Intact;Edema;Induration 05/15/2015 10:37 AM  Margins Unattached edges (unapproximated) 05/15/2015 10:37 AM  Closure None 05/15/2015 10:37 AM  Drainage Amount Moderate 05/15/2015 10:37 AM  Drainage Description Sanguineous 05/15/2015 10:37 AM  Treatment Hydrotherapy (Pulse lavage);Packing (Plain strip)  05/15/2015 10:37 AM      Hydrotherapy Pulsed lavage therapy - wound location: dorsal and radial wound sites Pulsed Lavage with Suction (psi): 8 psi (started with 4 due to pain) Pulsed Lavage with Suction - Normal Saline Used: 1000 mL Pulsed Lavage Tip: Tip with splash shield   Wound Assessment and Plan  Wound Therapy - Assess/Plan/Recommendations Wound Therapy - Clinical Statement: Tolerated fairly well today. Wound Therapy - Functional Problem List: Decreased AROM and acute pain of L hand Factors Delaying/Impairing Wound Healing: Tobacco use Hydrotherapy Plan: Debridement;Dressing change;Patient/family education;Pulsatile lavage with suction Wound Therapy - Frequency: 6X / week Wound Therapy - Follow Up Recommendations: Home health RN;Other (comment) (MD office) Wound Plan: See above  Wound Therapy Goals- Improve the function of patient's integumentary system by progressing the wound(s) through the phases of wound healing (inflammation - proliferation - remodeling) by: Patient/Family will be able to : manage dressing changes independently by d/c. Patient/Family Instruction Goal - Progress: Progressing toward goal Time For Goal Achievement: 7 days Wound Therapy - Potential for Goals: Excellent  Goals will be updated until maximal potential achieved or discharge criteria met.  Discharge criteria: when goals achieved, discharge from hospital, MD decision/surgical intervention, no progress towards goals, refusal/missing three consecutive treatments without notification or medical reason.  GP     Despina Pole 05/15/2015, 10:41 AM Carita Pian. Sanjuana Kava, [place] Pager [phone]

## 2015-05-16 ENCOUNTER — Encounter (HOSPITAL_COMMUNITY): Payer: Self-pay | Admitting: Orthopedic Surgery

## 2015-05-16 LAB — WOUND CULTURE
Culture: NO GROWTH
Culture: NORMAL
Gram Stain: NONE SEEN

## 2015-05-16 LAB — CULTURE, BLOOD (ROUTINE X 2)
Culture: NO GROWTH
Culture: NO GROWTH

## 2015-05-18 LAB — ANAEROBIC CULTURE: Gram Stain: NONE SEEN

## 2015-05-30 DIAGNOSIS — M79642 Pain in left hand: Secondary | ICD-10-CM | POA: Insufficient documentation

## 2016-10-14 IMAGING — CT CT ANGIO CHEST
2 of 7 series · 17 of 36 positions shown · IV contrast (omnipaque)
Comparison: Chest radiograph from earlier today.

CLINICAL DATA: Shortness of breath. Chest pain. Cough. Fever.
Tachycardia. Hypoxia.

EXAM:
CT ANGIOGRAPHY CHEST WITH CONTRAST
TECHNIQUE: Multidetector CT imaging of the chest was performed using the
standard protocol during bolus administration of intravenous
contrast. Multiplanar CT image reconstructions and MIPs were
obtained to evaluate the vascular anatomy.
CONTRAST:  100mL OMNIPAQUE IOHEXOL 350 MG/ML SOLN

[Series 6: pe thins · axial · 0.68mm/px · z∈[+158,+450]mm · 16 of 657 slices shown]
[im 37/657  lung]
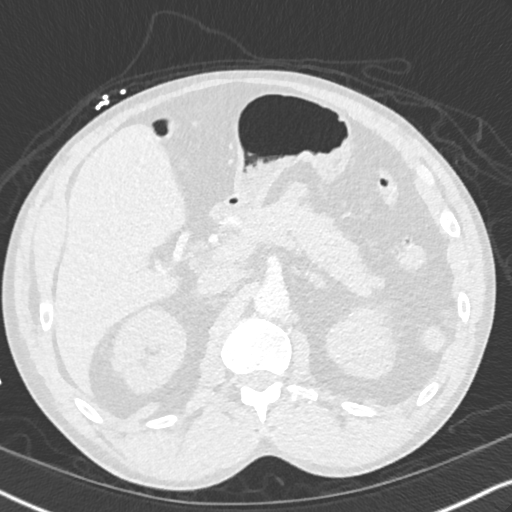
[im 73/657  mediastinal]
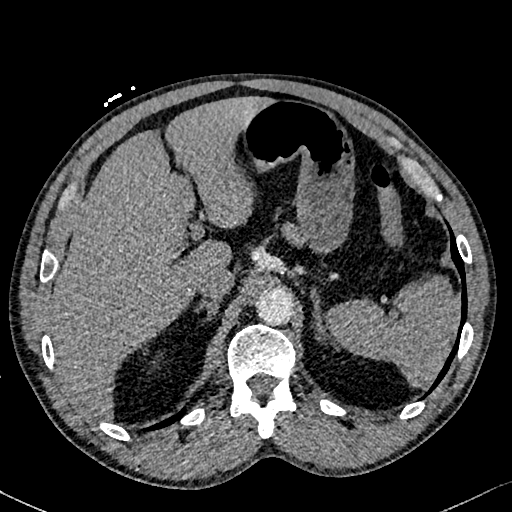
[im 110/657  lung]
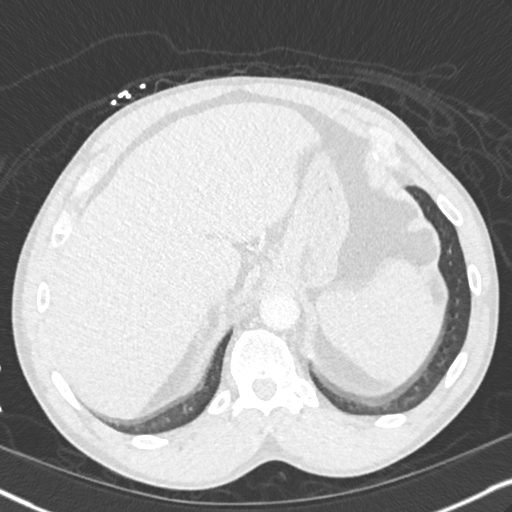
[im 146/657  mediastinal]
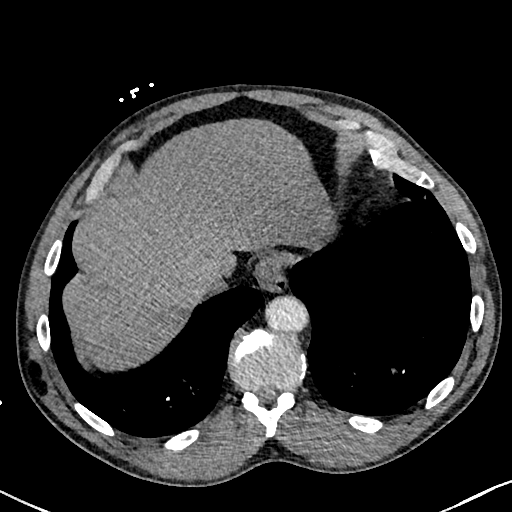
[im 183/657  lung]
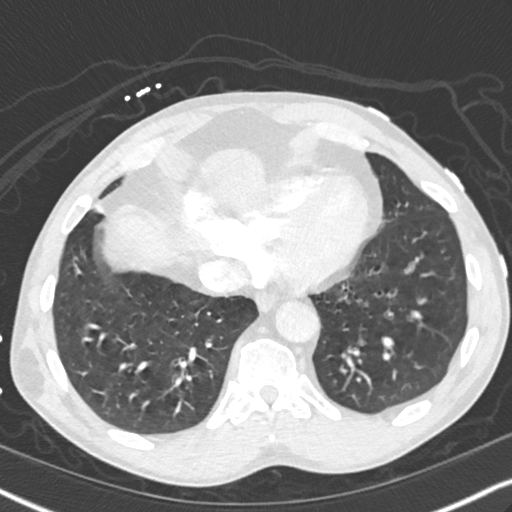
[im 219/657  mediastinal]
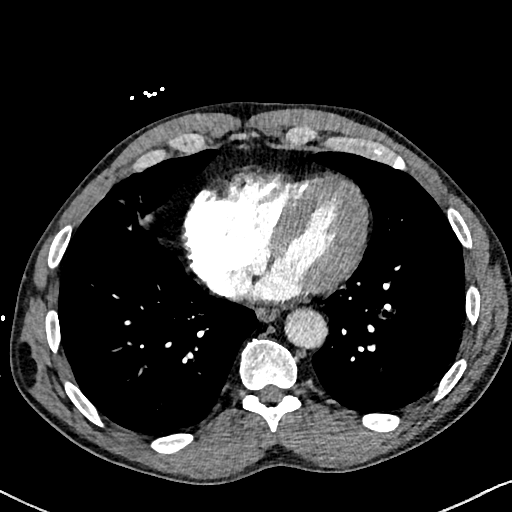
[im 256/657  lung]
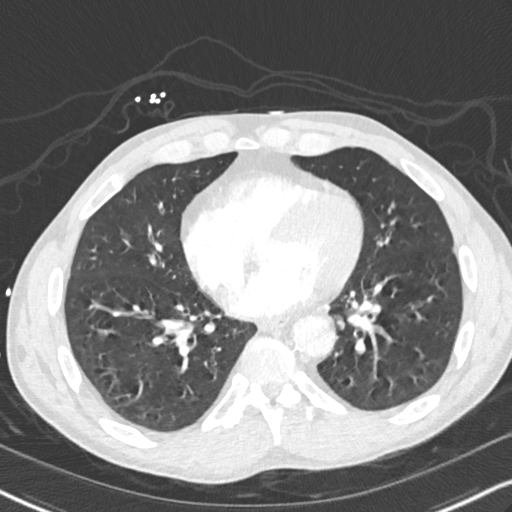
[im 292/657  mediastinal]
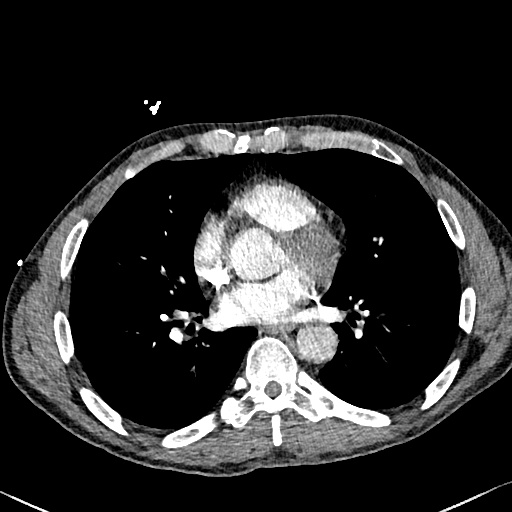
[im 365/657  lung]
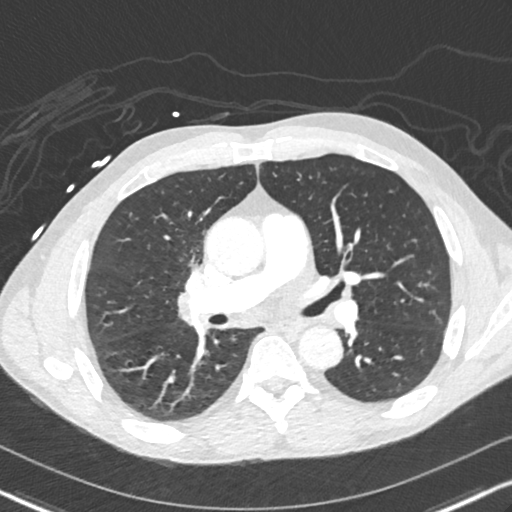
[im 401/657  mediastinal]
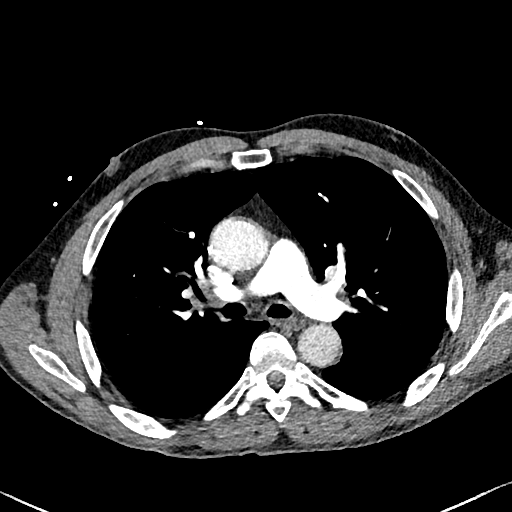
[im 438/657  lung]
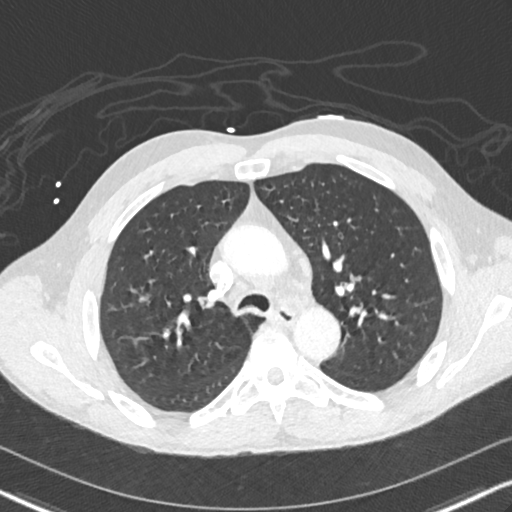
[im 474/657  mediastinal]
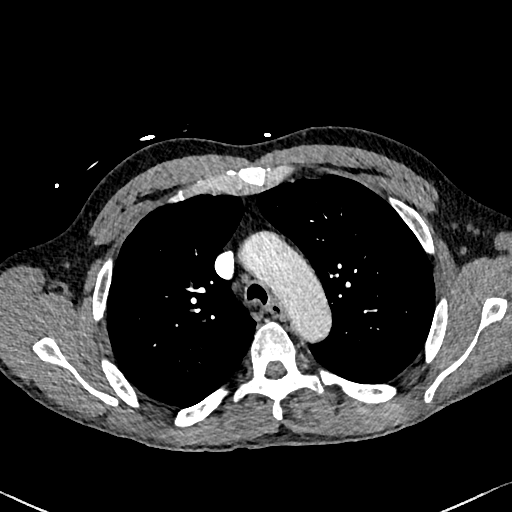
[im 511/657  lung]
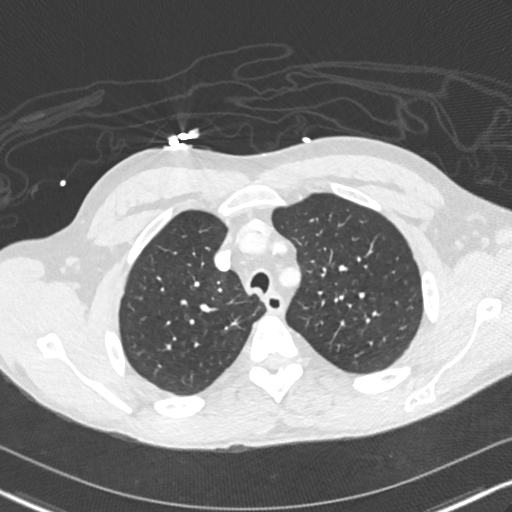
[im 547/657  mediastinal]
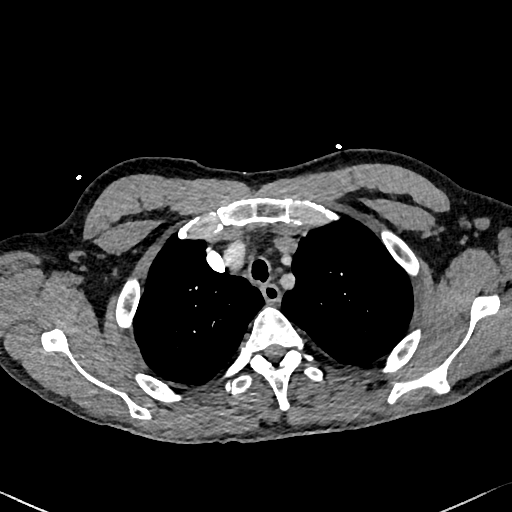
[im 584/657  lung]
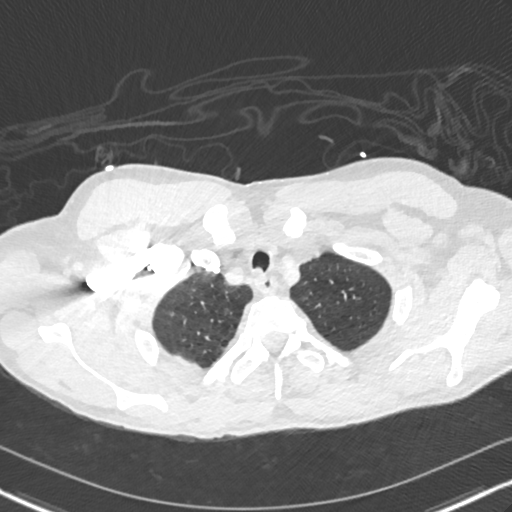
[im 620/657  mediastinal]
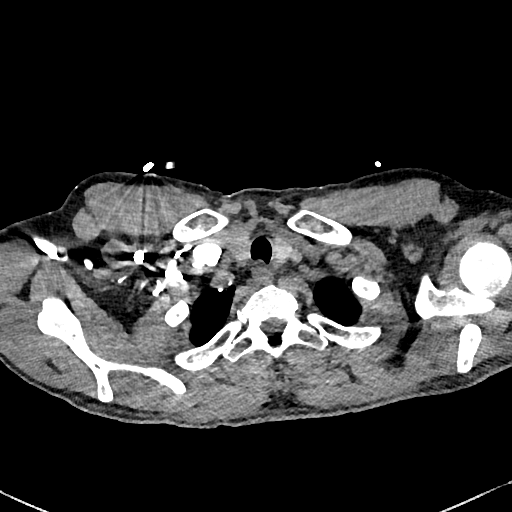

[Series 7: pe 2mm cor · coronal · 0.64mm/px · 1 of 111 slices shown]
[im 56/111  mediastinal]
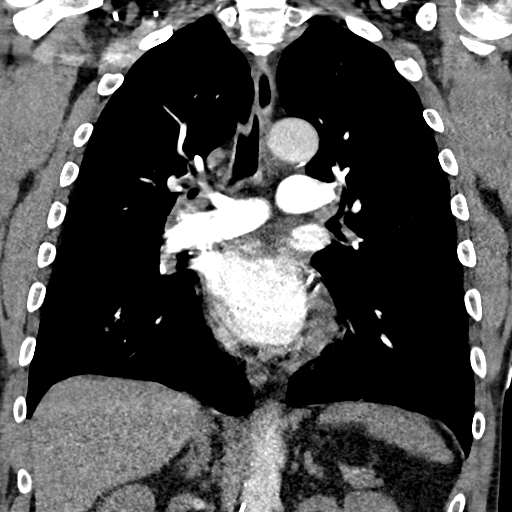

[17 of 36 positions shown; findings below may reference images not displayed]

FINDINGS: Mediastinum/Nodes: Evaluation of the subsegmental pulmonary arteries
is limited by respiratory motion artifact throughout the lungs.
There are no filling defects in the central, lobar or segmental
pulmonary artery branches to suggest acute pulmonary embolism. There
are calcifications within the walls of the left and right pulmonary
arteries at the bifurcations (series 4/ image 78 on the right and
series 4/ image 68 on the left). Great vessels are normal in course
and caliber. Main pulmonary artery diameter is 2.9 cm, within normal
limits. Normal heart size. No pericardial fluid/thickening. There is
atherosclerosis of the thoracic aorta, the great vessels of the
mediastinum and the coronary arteries, including calcified
atherosclerotic plaque in the left anterior descending and left
circumflex coronary arteries. Normal visualized thyroid. Normal
esophagus. No axillary lymphadenopathy. Mildly enlarged 1.1 cm
subcarinal node (series 4/ image 69). Mild bilateral hilar
lymphadenopathy, including a 1.3 cm right hilar node (4/67) and a
1.1 cm left hilar node (4/69).

Lungs/Pleura: No pneumothorax. No pleural effusion. Minimal scarring
versus atelectasis in the basilar right middle lobe. There is no
acute consolidative airspace disease. Faint ground-glass
centrilobular nodularity in the upper lobes and superior segments of
the lower lobes.

Upper abdomen: Tiny hiatal hernia.

Musculoskeletal: No aggressive appearing focal osseous lesions. Mild
degenerative changes in the thoracic spine. Small intramuscular
lipoma in the right posterior superficial back (series 4/image 116).

Review of the MIP images confirms the above findings.
IMPRESSION: 1. No evidence of acute pulmonary embolism, with mild limitations as
described. Nonspecific calcifications within the walls of the
central pulmonary arteries, which could be evidence of chronic
pulmonary hypertension, including possibly chronic thromboembolic
pulmonary hypertension.
2. Faint ground-glass centrilobular nodularity in both lungs, upper
lobe predominant. If the patient is a smoker, this is most
suggestive of smoking related interstitial lung disease. If the
patient is not a smoker, consider hypersensitivity pneumonitis.
3. Mild mediastinal and bilateral hilar lymphadenopathy,
nonspecific, most likely reactive.
4. Atherosclerosis, including two-vessel coronary artery disease.
Please note that although the presence of coronary artery calcium
documents the presence of coronary artery disease, the severity of
this disease and any potential stenosis cannot be assessed on this
non-gated CT examination. Assessment for potential risk factor
modification, dietary therapy or pharmacologic therapy may be
warranted, if clinically indicated.

## 2020-07-16 ENCOUNTER — Emergency Department (HOSPITAL_COMMUNITY): Payer: Medicaid Other

## 2020-07-16 ENCOUNTER — Encounter (HOSPITAL_COMMUNITY): Payer: Self-pay | Admitting: Emergency Medicine

## 2020-07-16 ENCOUNTER — Other Ambulatory Visit: Payer: Self-pay

## 2020-07-16 ENCOUNTER — Inpatient Hospital Stay (HOSPITAL_COMMUNITY)
Admission: EM | Admit: 2020-07-16 | Discharge: 2020-07-20 | DRG: 374 | Disposition: A | Discharge status: Home / Self Care | Payer: Medicaid Other | Attending: Internal Medicine | Admitting: Internal Medicine

## 2020-07-16 DIAGNOSIS — R590 Localized enlarged lymph nodes: Secondary | ICD-10-CM | POA: Diagnosis present

## 2020-07-16 DIAGNOSIS — R634 Abnormal weight loss: Secondary | ICD-10-CM | POA: Diagnosis present

## 2020-07-16 DIAGNOSIS — R933 Abnormal findings on diagnostic imaging of other parts of digestive tract: Secondary | ICD-10-CM

## 2020-07-16 DIAGNOSIS — E611 Iron deficiency: Secondary | ICD-10-CM | POA: Diagnosis present

## 2020-07-16 DIAGNOSIS — E43 Unspecified severe protein-calorie malnutrition: Secondary | ICD-10-CM | POA: Diagnosis present

## 2020-07-16 DIAGNOSIS — C787 Secondary malignant neoplasm of liver and intrahepatic bile duct: Secondary | ICD-10-CM | POA: Diagnosis present

## 2020-07-16 DIAGNOSIS — R1312 Dysphagia, oropharyngeal phase: Secondary | ICD-10-CM | POA: Diagnosis present

## 2020-07-16 DIAGNOSIS — D638 Anemia in other chronic diseases classified elsewhere: Secondary | ICD-10-CM | POA: Diagnosis present

## 2020-07-16 DIAGNOSIS — J9601 Acute respiratory failure with hypoxia: Secondary | ICD-10-CM | POA: Diagnosis present

## 2020-07-16 DIAGNOSIS — R030 Elevated blood-pressure reading, without diagnosis of hypertension: Secondary | ICD-10-CM | POA: Diagnosis present

## 2020-07-16 DIAGNOSIS — Z681 Body mass index (BMI) 19 or less, adult: Secondary | ICD-10-CM

## 2020-07-16 DIAGNOSIS — F17210 Nicotine dependence, cigarettes, uncomplicated: Secondary | ICD-10-CM | POA: Diagnosis present

## 2020-07-16 DIAGNOSIS — C155 Malignant neoplasm of lower third of esophagus: Principal | ICD-10-CM | POA: Diagnosis present

## 2020-07-16 DIAGNOSIS — E538 Deficiency of other specified B group vitamins: Secondary | ICD-10-CM | POA: Diagnosis present

## 2020-07-16 DIAGNOSIS — R079 Chest pain, unspecified: Secondary | ICD-10-CM | POA: Diagnosis present

## 2020-07-16 DIAGNOSIS — J449 Chronic obstructive pulmonary disease, unspecified: Secondary | ICD-10-CM | POA: Diagnosis present

## 2020-07-16 DIAGNOSIS — K2289 Other specified disease of esophagus: Secondary | ICD-10-CM

## 2020-07-16 DIAGNOSIS — Z20822 Contact with and (suspected) exposure to covid-19: Secondary | ICD-10-CM | POA: Diagnosis present

## 2020-07-16 DIAGNOSIS — Z9049 Acquired absence of other specified parts of digestive tract: Secondary | ICD-10-CM

## 2020-07-16 DIAGNOSIS — R0789 Other chest pain: Secondary | ICD-10-CM | POA: Diagnosis present

## 2020-07-16 DIAGNOSIS — F17200 Nicotine dependence, unspecified, uncomplicated: Secondary | ICD-10-CM | POA: Diagnosis present

## 2020-07-16 DIAGNOSIS — K869 Disease of pancreas, unspecified: Secondary | ICD-10-CM | POA: Diagnosis present

## 2020-07-16 DIAGNOSIS — R0902 Hypoxemia: Secondary | ICD-10-CM

## 2020-07-16 DIAGNOSIS — K8689 Other specified diseases of pancreas: Secondary | ICD-10-CM | POA: Diagnosis present

## 2020-07-16 LAB — CBC WITH DIFFERENTIAL/PLATELET
Abs Immature Granulocytes: 0.03 10*3/uL (ref 0.00–0.07)
Basophils Absolute: 0 10*3/uL (ref 0.0–0.1)
Basophils Relative: 0 %
Eosinophils Absolute: 0 10*3/uL (ref 0.0–0.5)
Eosinophils Relative: 0 %
HCT: 35 % — ABNORMAL LOW (ref 39.0–52.0)
Hemoglobin: 11.7 g/dL — ABNORMAL LOW (ref 13.0–17.0)
Immature Granulocytes: 0 %
Lymphocytes Relative: 11 %
Lymphs Abs: 0.9 10*3/uL (ref 0.7–4.0)
MCH: 26.5 pg (ref 26.0–34.0)
MCHC: 33.4 g/dL (ref 30.0–36.0)
MCV: 79.4 fL — ABNORMAL LOW (ref 80.0–100.0)
Monocytes Absolute: 0.6 10*3/uL (ref 0.1–1.0)
Monocytes Relative: 7 %
Neutro Abs: 7 10*3/uL (ref 1.7–7.7)
Neutrophils Relative %: 82 %
Platelets: 163 10*3/uL (ref 150–400)
RBC: 4.41 MIL/uL (ref 4.22–5.81)
RDW: 13 % (ref 11.5–15.5)
WBC: 8.6 10*3/uL (ref 4.0–10.5)
nRBC: 0 % (ref 0.0–0.2)

## 2020-07-16 LAB — BRAIN NATRIURETIC PEPTIDE: B Natriuretic Peptide: 66.1 pg/mL (ref 0.0–100.0)

## 2020-07-16 LAB — COMPREHENSIVE METABOLIC PANEL
ALT: 9 U/L (ref 0–44)
AST: <5 U/L — ABNORMAL LOW (ref 15–41)
Albumin: 3.6 g/dL (ref 3.5–5.0)
Alkaline Phosphatase: 53 U/L (ref 38–126)
Anion gap: 8 (ref 5–15)
BUN: 13 mg/dL (ref 8–23)
CO2: 27 mmol/L (ref 22–32)
Calcium: 9.1 mg/dL (ref 8.9–10.3)
Chloride: 101 mmol/L (ref 98–111)
Creatinine, Ser: 1.22 mg/dL (ref 0.61–1.24)
GFR, Estimated: >60 mL/min (ref >60)
Glucose, Bld: 109 mg/dL — ABNORMAL HIGH (ref 70–99)
Potassium: 3.8 mmol/L (ref 3.5–5.1)
Sodium: 136 mmol/L (ref 135–145)
Total Bilirubin: 1.1 mg/dL (ref 0.3–1.2)
Total Protein: 7 g/dL (ref 6.5–8.1)

## 2020-07-16 LAB — TROPONIN I (HIGH SENSITIVITY)
Troponin I (High Sensitivity): 7 ng/L (ref <18)
Troponin I (High Sensitivity): 8 ng/L (ref <18)

## 2020-07-16 MED ORDER — LABETALOL HCL 5 MG/ML IV SOLN
10.0000 mg | Freq: Once | INTRAVENOUS | Status: AC
  Administered 2020-07-16: 10 mg via INTRAVENOUS
  Filled 2020-07-16: qty 4

## 2020-07-16 MED ORDER — ONDANSETRON HCL 4 MG/2ML IJ SOLN
4.0000 mg | Freq: Once | INTRAMUSCULAR | Status: AC
  Administered 2020-07-16: 4 mg via INTRAVENOUS
  Filled 2020-07-16: qty 2

## 2020-07-16 MED ORDER — IOHEXOL 350 MG/ML SOLN
100.0000 mL | Freq: Once | INTRAVENOUS | Status: AC | PRN
  Administered 2020-07-16: 100 mL via INTRAVENOUS

## 2020-07-16 MED ORDER — MORPHINE SULFATE (PF) 4 MG/ML IV SOLN
4.0000 mg | Freq: Once | INTRAVENOUS | Status: AC
  Administered 2020-07-16: 4 mg via INTRAVENOUS
  Filled 2020-07-16: qty 1

## 2020-07-16 NOTE — ED Notes (Addendum)
Pt's oxygen saturation decreased to 88% on room air. Pt stated feeling a "little bit short of breath." Placed on 3L nasal cannula & oxygen saturation increased to 97%. Shawn, PA, made aware

## 2020-07-16 NOTE — ED Provider Notes (Addendum)
[facility] EMERGENCY DEPARTMENT Provider Note   CSN: [id number] Arrival date & time: 07/16/20  1810     History Chief Complaint  Patient presents with   Chest Pain    Ramzi Brathwaite is a 60 y.o. male.  HPI     Christian Newton is a 60 y.o. male, with a history of COPD, tobacco use, presenting to the ED with pain in the chest, back, and abdomen suddenly constant last night. Pain is sharp, constant, nonradiating from these central locations. Pain is worse with exertion. Accompanied by shortness of breath, orthopnea, and cough.  He does note he had intermittent pain in the chest, back, and abdomen over the last 2 weeks. Denies fever/chills, dizziness, syncope, numbness, weakness, LE edema/pain, N/V/D, or any other complaints.     Past Medical History:  Diagnosis Date   Cat bite of left hand 05/11/2015   COPD (chronic obstructive pulmonary disease) ([place])    Smoker     Patient Active Problem List   Diagnosis Date Noted   Chest pain 07/17/2020   Cellulitis of left hand 05/15/2015   COPD (chronic obstructive pulmonary disease) ([place]) 05/13/2015   Tobacco abuse 05/13/2015   COPD exacerbation ([place]) 10/20/2014   Smoker 10/20/2014   Positive D dimer 10/20/2014   Chest pain, pleuritic 10/20/2014   Thrombocytopenia ([place]) 10/20/2014   Cat bite     Past Surgical History:  Procedure Laterality Date   APPENDECTOMY     I & D EXTREMITY Left 05/13/2015   Procedure: INCISION AND DRAINAGE OF LEFT EXTREMITY;  Surgeon: Charlotte Crumb, MD;  Location: [place];  Service: Orthopedics;  Laterality: Left;   INCISION AND DRAINAGE Left 05/13/2015       History reviewed. No pertinent family history.  Social History   Tobacco Use   Smoking status: Every Day    Packs/day: 1.00    Years: 20.00    Pack years: 20.00    Types: Cigarettes   Smokeless tobacco: Never  Substance Use Topics   Alcohol use: No   Drug use: No    Home Medications Prior to Admission  medications   Medication Sig Start Date End Date Taking? Authorizing Provider  acetaminophen (TYLENOL) 500 MG tablet Take 500 mg by mouth every 6 (six) hours as needed for mild pain or headache.   Yes [provider]  albuterol (PROVENTIL HFA;VENTOLIN HFA) 108 (90 BASE) MCG/ACT inhaler Inhale 2 puffs into the lungs every 6 (six) hours as needed for wheezing or shortness of breath. 10/21/14 05/12/15  Velvet Bathe, MD    Allergies    Patient has no known allergies.  Review of Systems   Review of Systems  Gastrointestinal:  Negative for blood in stool and diarrhea.  All other systems reviewed and are negative.  Physical Exam Updated Vital Signs BP (!) 171/101 (BP Location: Right Arm)   Pulse (!) 105   Temp 98.8 F (37.1 C)   Resp (!) 26   SpO2 95%   Physical Exam Vitals and nursing note reviewed.  Constitutional:      General: He is not in acute distress.    Appearance: He is well-developed. He is ill-appearing. He is not diaphoretic.  HENT:     Head: Normocephalic and atraumatic.     Mouth/Throat:     Mouth: Mucous membranes are moist.     Pharynx: Oropharynx is clear.  Eyes:     Conjunctiva/sclera: Conjunctivae normal.  Cardiovascular:     Rate and Rhythm: Normal rate and  regular rhythm.     Pulses: Normal pulses.          Radial pulses are 2+ on the right side and 2+ on the left side.       Posterior tibial pulses are 2+ on the right side and 2+ on the left side.     Heart sounds: Normal heart sounds.     Comments: Tactile temperature in the extremities appropriate and equal bilaterally. Pulmonary:     Comments: Rales versus rhonchi in all fields.  No noted orthopnea. Abdominal:     Palpations: Abdomen is soft.     Tenderness: There is abdominal tenderness. There is no guarding.    Musculoskeletal:     Cervical back: Neck supple.     Right lower leg: No edema.     Left lower leg: No edema.  Lymphadenopathy:     Cervical: No cervical adenopathy.  Skin:     General: Skin is warm and dry.  Neurological:     Mental Status: He is alert.     Comments: No noted acute cognitive deficit. Sensation grossly intact to light touch in the extremities.   Grip strengths equal bilaterally.   Strength 5/5 in all extremities.  Coordination intact.  Cranial nerves III-XII grossly intact.  Handles oral secretions without noted difficulty.  No noted phonation or speech deficit. No facial droop.   Psychiatric:        Mood and Affect: Mood and affect normal.        Speech: Speech normal.        Behavior: Behavior normal.    ED Results / Procedures / Treatments   Labs (all labs ordered are listed, but only abnormal results are displayed) Labs Reviewed  CBC WITH DIFFERENTIAL/PLATELET - Abnormal; Notable for the following components:      Result Value   Hemoglobin 11.7 (*)    HCT 35.0 (*)    MCV 79.4 (*)    All other components within normal limits  COMPREHENSIVE METABOLIC PANEL - Abnormal; Notable for the following components:   Glucose, Bld 109 (*)    AST <5 (*)    All other components within normal limits  SARS CORONAVIRUS 2 (TAT 6-24 HRS)  BRAIN NATRIURETIC PEPTIDE  HIV ANTIBODY (ROUTINE TESTING W REFLEX)  CBC  BASIC METABOLIC PANEL  MAGNESIUM  PHOSPHORUS  IRON AND TIBC  FERRITIN  VITAMIN B12  FOLATE  D-DIMER, QUANTITATIVE  LIPASE, BLOOD  HEPATIC FUNCTION PANEL  TROPONIN I (HIGH SENSITIVITY)  TROPONIN I (HIGH SENSITIVITY)    EKG EKG Interpretation  Date/Time:  Saturday July 16 2020 19:12:36 EDT Ventricular Rate:  114 PR Interval:  140 QRS Duration: 74 QT Interval:  336 QTC Calculation: 463 R Axis:   81 Text Interpretation: Sinus tachycardia Septal infarct , age undetermined Abnormal ECG similar to EKG from earlier today Confirmed by Malvin Johns [phone]) on 07/16/2020 11:22:15 PM  Radiology DG Chest 2 View  Result Date: 07/16/2020 CLINICAL DATA:  Left-sided chest pain and back pain EXAM: CHEST - 2 VIEW COMPARISON:  10/20/2014  FINDINGS: The heart size and mediastinal contours are within normal limits. Pulmonary hyperinflation. The visualized skeletal structures are unremarkable. IMPRESSION: Pulmonary hyperinflation without acute abnormality of the lungs. Electronically Signed   By: Eddie Candle M.D.   On: 07/16/2020 20:14   CT Angio Chest/Abd/Pel for Dissection W and/or Wo Contrast  Result Date: 07/16/2020 CLINICAL DATA:  Abdominal pain, aortic dissection suspected EXAM: CT ANGIOGRAPHY CHEST, ABDOMEN AND PELVIS TECHNIQUE: Non-contrast CT of  the chest was initially obtained. Multidetector CT imaging through the chest, abdomen and pelvis was performed using the standard protocol during bolus administration of intravenous contrast. Multiplanar reconstructed images and MIPs were obtained and reviewed to evaluate the vascular anatomy. CONTRAST:  159mL OMNIPAQUE IOHEXOL 350 MG/ML SOLN COMPARISON:  CT angiography chest 10/20/2014 FINDINGS: CTA CHEST FINDINGS Cardiovascular: Satisfactory opacification of the pulmonary arteries to the segmental level. No evidence of pulmonary embolism. Normal heart size. No pericardial effusion. Mediastinum/Nodes: Limited evaluation for mediastinal lymphadenopathy due to timing of contrast. Question subcarinal lymph node. No enlarged hilar or axillary lymph nodes. Thyroid gland and trachea demonstrate no significant findings. Marked diffuse esophageal wall thickening and haziness. Associated posterior mediastinal haziness and soft tissue density. Soft tissue density in haziness abuts the posterior trachea and carina. Lungs/Pleura: No pulmonary nodule. No pulmonary mass. Thin consolidation along the pleura within the posterior right middle lobe. No pleural effusion. No pneumothorax. Musculoskeletal: No chest wall abnormality. No suspicious lytic or blastic osseous lesions. No acute displaced fracture. Multilevel degenerative changes of the spine. Review of the MIP images confirms the above findings. CTA ABDOMEN  AND PELVIS FINDINGS VASCULAR Aorta: At least moderate atherosclerotic plaque. Normal caliber aorta without aneurysm, dissection, vasculitis or significant stenosis. Celiac: Mild atherosclerotic plaque. Patent without evidence of aneurysm, dissection, vasculitis or significant stenosis. SMA: Mild atherosclerotic plaque. Patent without evidence of aneurysm, dissection, vasculitis or significant stenosis. Renals: Both renal arteries are patent without evidence of aneurysm, dissection, vasculitis, fibromuscular dysplasia or significant stenosis. IMA: Patent without evidence of aneurysm, dissection, vasculitis or significant stenosis. Inflow: At least moderate atherosclerotic plaque. Patent without evidence of aneurysm, dissection, vasculitis or significant stenosis. Veins: No obvious venous abnormality within the limitations of this arterial phase study. Review of the MIP images confirms the above findings. NON-VASCULAR Hepatobiliary: There is a 1.5 cm hypodensity within the left hepatic lobe (7:128) as well as a poorly defined 3.1 cm hypodensity within the inferior left lateral hepatic lobe (7:168). No gallstones, gallbladder wall thickening, or pericholecystic fluid. No biliary dilatation. Pancreas: Question 1.6 cm proximal pancreatic mass with markedly limited evaluation due to timing of contrast. Spleen: Normal in size without focal abnormality. Adrenals/Urinary Tract: No adrenal nodule bilaterally. Bilateral kidneys enhance symmetrically. There is a 1.3 cm fluid density lesion within the left kidney that likely represents a simple renal cyst. Subcentimeter hypodensities are too small to characterize. No hydronephrosis. No hydroureter. The urinary bladder is unremarkable. Stomach/Bowel: Stomach is within normal limits. No evidence of bowel wall thickening or dilatation. Lymphatic: No definite lymphadenopathy. Reproductive: Prostate is unremarkable. Other: No intraperitoneal free fluid. No intraperitoneal free gas.  No organized fluid collection. Musculoskeletal: No abdominal wall hernia or abnormality. No suspicious lytic or blastic osseous lesions. No acute displaced fracture. Multilevel degenerative changes of the spine. Review of the MIP images confirms the above findings. IMPRESSION: 1. No acute vascular abnormality. 2. Marked diffuse esophageal wall thickening and haziness as well as surrounding posterior mediastinal haziness/fat stranding. Findings concerning for malignancy versus inflammation. Recommend direct visualization. 3. Mediastinal lymphadenopathy. 4. Question 1.6 cm proximal pancreatic mass with markedly limited evaluation due to timing of contrast. Concern for malignancy. Recommend MRI pancreatic protocol for further evaluation. 5. Indeterminate 1.5 cm as well as another 3.1 cm poorly defined left hepatic lobe hypodensity. These finding can be further evaluated on MRI pancreatic protocol. Electronically Signed   By: Iven Finn M.D.   On: 07/16/2020 23:42    Procedures Procedures   Medications Ordered in ED Medications  enoxaparin (  LOVENOX) injection 40 mg (40 mg Subcutaneous Given 07/17/20 0113)  labetalol (NORMODYNE) injection 10 mg (10 mg Intravenous Given 07/16/20 2058)  iohexol (OMNIPAQUE) 350 MG/ML injection 100 mL (100 mLs Intravenous Contrast Given 07/16/20 2148)  morphine 4 MG/ML injection 4 mg (4 mg Intravenous Given 07/16/20 2206)  ondansetron (ZOFRAN) injection 4 mg (4 mg Intravenous Given 07/16/20 2208)    ED Course  I have reviewed the triage vital signs and the nursing notes.  Pertinent labs & imaging results that were available during my care of the patient were reviewed by me and considered in my medical decision making (see chart for details).  Clinical Course as of 07/17/20 0123  Sun Jul 17, 2020  0055 Spoke with Dr. Nevada Crane, hospitalist. Agrees to admit the patient. [SJ]    Clinical Course User Index [SJ] Mantaj Chamberlin, Helane Gunther, PA-C   MDM Rules/Calculators/A&P                           Patient presents with primary complaint of chest pain. Patient presented with pain that became suddenly constant, severe, and present in the chest, abdomen, and back.  He was noted to be hypertensive when he he was not previously noted to be this hypertensive on his other visits.  Also noted to be tachycardic. When patient attempted to exert himself and move around, he became more short of breath and SPO2 dropped to 88% on room air.  This recovered with 2 L supplemental O2. Delta troponins negative. CT without vascular abnormality, however, multiple areas suspicious for possible malignancy. I think patient could benefit from admission due to hypoxia, new oxygen requirement, and consult with oncology in the morning.   Findings and plan of care discussed with attending physician, Malvin Johns, MD. Dr. Tamera Punt personally evaluated and examined this patient.  Final Clinical Impression(s) / ED Diagnoses Final diagnoses:  Central chest pain  Hypoxia    Rx / DC Orders ED Discharge Orders     None        Layla Maw 07/17/20 0120    Lorayne Bender, PA-C 07/17/20 0123    Malvin Johns, MD 07/18/20 1400

## 2020-07-16 NOTE — ED Provider Notes (Signed)
Emergency Medicine Provider Triage Evaluation Note  Christian Newton , a 60 y.o. male  was evaluated in triage.  Pt complains of left-sided chest and upper back pain for about 1.5 weeks.  Pain is constant.  Chest pain feels like a "pounding".  Symptoms are worse when he lays down.  Some shortness of breath when he is lays down without lower extremity edema.  No abdominal complaints.  No new cough or fevers.  Treated with over-the-counter medications.  No injuries.  No history of blood clot.  Review of Systems  Positive: CP, back pain Negative: LE edema, cough, fever  Physical Exam  BP (!) 148/84 (BP Location: Left Arm)   Pulse 99   Temp 98.8 F (37.1 C)   Resp 18   SpO2 98%  Gen:   Awake, no distress   Resp:  Normal effort, lung sounds diminished left lung fields MSK:   Moves extremities without difficulty  Other:  Heart rate mild tachycardia  Medical Decision Making  Medically screening exam initiated at 6:50 PM.  Appropriate orders placed.  Christian Newton was informed that the remainder of the evaluation will be completed by another provider, this initial triage assessment does not replace that evaluation, and the importance of remaining in the ED until their evaluation is complete.     Christian Newton, [place] N, PA-C 07/16/20 Christian Newton    Christian Shanks, MD 07/16/20 [id number]

## 2020-07-16 NOTE — ED Triage Notes (Signed)
Pt reports L sided chest pain, upper abd pain, and pain across upper back x 1 1/2 weeks with SOB.  Denies nausea and vomiting.

## 2020-07-16 NOTE — ED Notes (Signed)
Patient transported to CT 

## 2020-07-17 ENCOUNTER — Observation Stay (HOSPITAL_COMMUNITY): Payer: Medicaid Other

## 2020-07-17 DIAGNOSIS — R933 Abnormal findings on diagnostic imaging of other parts of digestive tract: Secondary | ICD-10-CM

## 2020-07-17 DIAGNOSIS — E611 Iron deficiency: Secondary | ICD-10-CM | POA: Diagnosis present

## 2020-07-17 DIAGNOSIS — K8689 Other specified diseases of pancreas: Secondary | ICD-10-CM | POA: Diagnosis present

## 2020-07-17 DIAGNOSIS — R634 Abnormal weight loss: Secondary | ICD-10-CM

## 2020-07-17 DIAGNOSIS — Z720 Tobacco use: Secondary | ICD-10-CM

## 2020-07-17 DIAGNOSIS — R079 Chest pain, unspecified: Secondary | ICD-10-CM | POA: Diagnosis present

## 2020-07-17 DIAGNOSIS — J9601 Acute respiratory failure with hypoxia: Secondary | ICD-10-CM | POA: Diagnosis present

## 2020-07-17 DIAGNOSIS — E538 Deficiency of other specified B group vitamins: Secondary | ICD-10-CM | POA: Diagnosis present

## 2020-07-17 LAB — HEPATIC FUNCTION PANEL
ALT: 8 U/L (ref 0–44)
AST: 10 U/L — ABNORMAL LOW (ref 15–41)
Albumin: 3.3 g/dL — ABNORMAL LOW (ref 3.5–5.0)
Alkaline Phosphatase: 50 U/L (ref 38–126)
Bilirubin, Direct: 0.2 mg/dL (ref 0.0–0.2)
Indirect Bilirubin: 0.8 mg/dL (ref 0.3–0.9)
Total Bilirubin: 1 mg/dL (ref 0.3–1.2)
Total Protein: 6.5 g/dL (ref 6.5–8.1)

## 2020-07-17 LAB — D-DIMER, QUANTITATIVE: D-Dimer, Quant: 0.45 ug/mL-FEU (ref 0.00–0.50)

## 2020-07-17 LAB — BASIC METABOLIC PANEL
Anion gap: 12 (ref 5–15)
BUN: 11 mg/dL (ref 8–23)
CO2: 24 mmol/L (ref 22–32)
Calcium: 9 mg/dL (ref 8.9–10.3)
Chloride: 101 mmol/L (ref 98–111)
Creatinine, Ser: 1.03 mg/dL (ref 0.61–1.24)
GFR, Estimated: >60 mL/min (ref >60)
Glucose, Bld: 99 mg/dL (ref 70–99)
Potassium: 4 mmol/L (ref 3.5–5.1)
Sodium: 137 mmol/L (ref 135–145)

## 2020-07-17 LAB — IRON AND TIBC
Iron: 11 ug/dL — ABNORMAL LOW (ref 45–182)
Saturation Ratios: 5 % — ABNORMAL LOW (ref 17.9–39.5)
TIBC: 210 ug/dL — ABNORMAL LOW (ref 250–450)
UIBC: 199 ug/dL

## 2020-07-17 LAB — FOLATE: Folate: 8.7 ng/mL (ref >5.9)

## 2020-07-17 LAB — CBC
HCT: 34 % — ABNORMAL LOW (ref 39.0–52.0)
Hemoglobin: 11.3 g/dL — ABNORMAL LOW (ref 13.0–17.0)
MCH: 26.2 pg (ref 26.0–34.0)
MCHC: 33.2 g/dL (ref 30.0–36.0)
MCV: 78.9 fL — ABNORMAL LOW (ref 80.0–100.0)
Platelets: 163 10*3/uL (ref 150–400)
RBC: 4.31 MIL/uL (ref 4.22–5.81)
RDW: 13.1 % (ref 11.5–15.5)
WBC: 9 10*3/uL (ref 4.0–10.5)
nRBC: 0 % (ref 0.0–0.2)

## 2020-07-17 LAB — MAGNESIUM: Magnesium: 1.8 mg/dL (ref 1.7–2.4)

## 2020-07-17 LAB — PHOSPHORUS: Phosphorus: 3.7 mg/dL (ref 2.5–4.6)

## 2020-07-17 LAB — SARS CORONAVIRUS 2 (TAT 6-24 HRS): SARS Coronavirus 2: NEGATIVE

## 2020-07-17 LAB — LIPASE, BLOOD: Lipase: 21 U/L (ref 11–51)

## 2020-07-17 LAB — HIV ANTIBODY (ROUTINE TESTING W REFLEX): HIV Screen 4th Generation wRfx: NONREACTIVE

## 2020-07-17 LAB — VITAMIN B12: Vitamin B-12: 120 pg/mL — ABNORMAL LOW (ref 180–914)

## 2020-07-17 LAB — FERRITIN: Ferritin: 233 ng/mL (ref 24–336)

## 2020-07-17 MED ORDER — DIPHENHYDRAMINE HCL 50 MG/ML IJ SOLN
12.5000 mg | Freq: Once | INTRAMUSCULAR | Status: AC
  Administered 2020-07-17: 12.5 mg via INTRAVENOUS
  Filled 2020-07-17: qty 1

## 2020-07-17 MED ORDER — MELATONIN 3 MG PO TABS: 3.0000 mg | ORAL_TABLET | Freq: Every evening | ORAL | Status: DC | PRN

## 2020-07-17 MED ORDER — CYANOCOBALAMIN 1000 MCG/ML IJ SOLN
1000.0000 ug | Freq: Once | INTRAMUSCULAR | Status: AC
  Administered 2020-07-17: 1000 ug via INTRAMUSCULAR
  Filled 2020-07-17: qty 1

## 2020-07-17 MED ORDER — LIDOCAINE VISCOUS HCL 2 % MT SOLN
15.0000 mL | Freq: Three times a day (TID) | OROMUCOSAL | Status: DC
  Administered 2020-07-17 – 2020-07-18 (×4): 15 mL via ORAL
  Filled 2020-07-17 (×6): qty 15

## 2020-07-17 MED ORDER — OXYCODONE HCL 5 MG PO TABS
5.0000 mg | ORAL_TABLET | Freq: Four times a day (QID) | ORAL | Status: DC | PRN
  Administered 2020-07-18 – 2020-07-20 (×7): 5 mg via ORAL
  Filled 2020-07-17 (×7): qty 1

## 2020-07-17 MED ORDER — ACETAMINOPHEN 325 MG PO TABS
650.0000 mg | ORAL_TABLET | Freq: Three times a day (TID) | ORAL | Status: DC | PRN
  Administered 2020-07-19: 650 mg via ORAL
  Filled 2020-07-17: qty 2

## 2020-07-17 MED ORDER — METOPROLOL TARTRATE 25 MG PO TABS
25.0000 mg | ORAL_TABLET | Freq: Two times a day (BID) | ORAL | Status: DC
  Administered 2020-07-17 – 2020-07-20 (×7): 25 mg via ORAL
  Filled 2020-07-17 (×7): qty 1

## 2020-07-17 MED ORDER — PANTOPRAZOLE SODIUM 40 MG IV SOLR
40.0000 mg | INTRAVENOUS | Status: DC
  Administered 2020-07-17: 40 mg via INTRAVENOUS
  Filled 2020-07-17: qty 40

## 2020-07-17 MED ORDER — ENOXAPARIN SODIUM 40 MG/0.4ML IJ SOSY
40.0000 mg | PREFILLED_SYRINGE | Freq: Every day | INTRAMUSCULAR | Status: DC
  Administered 2020-07-17 – 2020-07-19 (×4): 40 mg via SUBCUTANEOUS
  Filled 2020-07-17 (×4): qty 0.4

## 2020-07-17 MED ORDER — ALBUTEROL SULFATE (2.5 MG/3ML) 0.083% IN NEBU: 2.5000 mg | INHALATION_SOLUTION | RESPIRATORY_TRACT | Status: DC | PRN

## 2020-07-17 MED ORDER — PANTOPRAZOLE SODIUM 40 MG IV SOLR
40.0000 mg | Freq: Two times a day (BID) | INTRAVENOUS | Status: DC
  Administered 2020-07-17 – 2020-07-20 (×6): 40 mg via INTRAVENOUS
  Filled 2020-07-17 (×6): qty 40

## 2020-07-17 MED ORDER — MORPHINE SULFATE (PF) 2 MG/ML IV SOLN
2.0000 mg | INTRAVENOUS | Status: DC | PRN
  Administered 2020-07-17 – 2020-07-18 (×2): 2 mg via INTRAVENOUS
  Filled 2020-07-17 (×2): qty 1

## 2020-07-17 MED ORDER — SODIUM CHLORIDE 0.9 % IV SOLN
510.0000 mg | Freq: Once | INTRAVENOUS | Status: AC
  Administered 2020-07-17: 510 mg via INTRAVENOUS
  Filled 2020-07-17: qty 17

## 2020-07-17 MED ORDER — LACTATED RINGERS IV SOLN: INTRAVENOUS | Status: AC

## 2020-07-17 MED ORDER — IPRATROPIUM-ALBUTEROL 0.5-2.5 (3) MG/3ML IN SOLN
3.0000 mL | Freq: Two times a day (BID) | RESPIRATORY_TRACT | Status: DC
  Administered 2020-07-18 – 2020-07-19 (×4): 3 mL via RESPIRATORY_TRACT
  Filled 2020-07-17 (×4): qty 3

## 2020-07-17 MED ORDER — ALUM & MAG HYDROXIDE-SIMETH 200-200-20 MG/5ML PO SUSP
30.0000 mL | Freq: Three times a day (TID) | ORAL | Status: DC
  Administered 2020-07-17 – 2020-07-18 (×4): 30 mL via ORAL
  Filled 2020-07-17 (×4): qty 30

## 2020-07-17 MED ORDER — METHYLPREDNISOLONE SODIUM SUCC 125 MG IJ SOLR
60.0000 mg | Freq: Once | INTRAMUSCULAR | Status: AC
  Administered 2020-07-17: 60 mg via INTRAVENOUS
  Filled 2020-07-17: qty 2

## 2020-07-17 MED ORDER — POLYETHYLENE GLYCOL 3350 17 G PO PACK: 17.0000 g | PACK | Freq: Every day | ORAL | Status: DC | PRN

## 2020-07-17 MED ORDER — SENNOSIDES-DOCUSATE SODIUM 8.6-50 MG PO TABS
2.0000 | ORAL_TABLET | Freq: Every day | ORAL | Status: DC
  Administered 2020-07-17 – 2020-07-19 (×3): 2 via ORAL
  Filled 2020-07-17 (×4): qty 2

## 2020-07-17 MED ORDER — BUDESONIDE 0.25 MG/2ML IN SUSP
0.2500 mg | Freq: Two times a day (BID) | RESPIRATORY_TRACT | Status: DC
  Administered 2020-07-17 – 2020-07-19 (×6): 0.25 mg via RESPIRATORY_TRACT
  Filled 2020-07-17 (×5): qty 2

## 2020-07-17 MED ORDER — IPRATROPIUM-ALBUTEROL 0.5-2.5 (3) MG/3ML IN SOLN
3.0000 mL | Freq: Four times a day (QID) | RESPIRATORY_TRACT | Status: DC
  Administered 2020-07-17 (×2): 3 mL via RESPIRATORY_TRACT
  Filled 2020-07-17 (×2): qty 3

## 2020-07-17 MED ORDER — ENSURE ENLIVE PO LIQD
237.0000 mL | Freq: Two times a day (BID) | ORAL | Status: DC
  Administered 2020-07-17 – 2020-07-18 (×2): 237 mL via ORAL

## 2020-07-17 MED ORDER — GADOBUTROL 1 MMOL/ML IV SOLN
5.0000 mL | Freq: Once | INTRAVENOUS | Status: AC | PRN
  Administered 2020-07-17: 5 mL via INTRAVENOUS

## 2020-07-17 NOTE — Consult Note (Addendum)
Consultation  Referring Provider: TRH/ Memon  Primary Care Physician:  Pcp, No Primary Gastroenterologist:  none  Reason for Consultation: Abnormal imaging of the esophagus, chest pain  HPI: Christian Newton is a 60 y.o. male ,, who was admitted yesterday after he presented to the emergency room with complaints of chest pain, back pain and abdominal pain.  He says he has been feeling poorly over the past several weeks, has been having both chest and back pain which has gotten progressively worse and has been fairly constant.  Along with that he has had a significant decrease in appetite and has lost about 30 pounds.  He is complaining of dysphagia and odynophagia primarily to solids, has been able to get liquids down.  He says that solid foods feel as if they are sitting in his esophagus and frequently will come back up, he has not had any hematemesis, unaware of any fever or chills.  He has also developed abdominal pain which is vague but seems to be around the periumbilical area, no changes in bowel habits, unaware of any melena or hematochezia. No regular EtOH, no regular aspirin or NSAIDs, no blood thinners.  He has not had any prior GI evaluation.  Other medical problems include COPD, continued tobacco use, no O2 use. Dilation in the emergency room with CT angio of the chest abdomen and pelvis shows marked diffuse esophageal wall thickening and haziness as well as surrounding posterior mediastinal haziness and fat stranding findings concerning for malignancy versus inflammation, mediastinal adenopathy, question 1.6 cm proximal pancreatic mass Limited evaluation due to timing of contrast and indeterminate 1.5 cm and another 3.1 cm poorly defined left hepatic lobe hypodensities, MRI recommended.  Labs WBC 8.6/hemoglobin 11.7/hematocrit 35/MCV 79 BNP 66 COVID screen negative, LFTs within normal limits D-dimer normal BUN 13/creatinine 1.22  Ferritin 233 Iron 11/TIBC 210/iron sat  5.  Patient is scheduled to have MRI of the abdomen later today     Past Medical History:  Diagnosis Date  . Cat bite of left hand 05/11/2015  . COPD (chronic obstructive pulmonary disease) ([place])   . Smoker     Past Surgical History:  Procedure Laterality Date  . APPENDECTOMY    . I & D EXTREMITY Left 05/13/2015   Procedure: INCISION AND DRAINAGE OF LEFT EXTREMITY;  Surgeon: Charlotte Crumb, MD;  Location: [facility];  Service: Orthopedics;  Laterality: Left;  . INCISION AND DRAINAGE Left 05/13/2015    Prior to Admission medications   Medication Sig Start Date End Date Taking? Authorizing Provider  acetaminophen (TYLENOL) 500 MG tablet Take 500 mg by mouth every 6 (six) hours as needed for mild pain or headache.   Yes [provider]  albuterol (PROVENTIL HFA;VENTOLIN HFA) 108 (90 BASE) MCG/ACT inhaler Inhale 2 puffs into the lungs every 6 (six) hours as needed for wheezing or shortness of breath. 10/21/14 05/12/15  Velvet Bathe, MD    Current Facility-Administered Medications  Medication Dose Route Frequency Provider Last Rate Last Admin  . acetaminophen (TYLENOL) tablet 650 mg  650 mg Oral Q8H PRN Irene Pap N, DO      . alum & mag hydroxide-simeth (MAALOX/MYLANTA) 200-200-20 MG/5ML suspension 30 mL  30 mL Oral TID Kathie Dike, MD       And  . lidocaine (XYLOCAINE) 2 % viscous mouth solution 15 mL  15 mL Oral TID Kathie Dike, MD      . budesonide (PULMICORT) nebulizer solution 0.25 mg  0.25 mg Nebulization BID Kathie Dike,  MD      . cyanocobalamin ((VITAMIN B-12)) injection 1,000 mcg  1,000 mcg Intramuscular Once Kathie Dike, MD      . enoxaparin (LOVENOX) injection 40 mg  40 mg Subcutaneous QHS Hall, Carole N, DO   40 mg at 07/17/20 0113  . feeding supplement (ENSURE ENLIVE / ENSURE PLUS) liquid 237 mL  237 mL Oral BID BM Hall, Carole N, DO   237 mL at 07/17/20 0931  . ferumoxytol (FERAHEME) 510 mg in sodium chloride 0.9 % 100 mL IVPB  510 mg Intravenous  Once Kathie Dike, MD      . ipratropium-albuterol (DUONEB) 0.5-2.5 (3) MG/3ML nebulizer solution 3 mL  3 mL Nebulization Q6H Memon, Jehanzeb, MD      . lactated ringers infusion   Intravenous Continuous Kayleen Memos, DO 75 mL/hr at 07/17/20 0320 New Bag at 07/17/20 0320  . melatonin tablet 3 mg  3 mg Oral QHS PRN Irene Pap N, DO      . metoprolol tartrate (LOPRESSOR) tablet 25 mg  25 mg Oral BID Kathie Dike, MD      . morphine 2 MG/ML injection 2 mg  2 mg Intravenous Q4H PRN Irene Pap N, DO   2 mg at 07/17/20 0335  . oxyCODONE (Oxy IR/ROXICODONE) immediate release tablet 5 mg  5 mg Oral Q6H PRN Hall, Carole N, DO      . pantoprazole (PROTONIX) injection 40 mg  40 mg Intravenous Q24H Memon, Jolaine Artist, MD      . polyethylene glycol (MIRALAX / GLYCOLAX) packet 17 g  17 g Oral Daily PRN Irene Pap N, DO      . senna-docusate (Senokot-S) tablet 2 tablet  2 tablet Oral QHS Irene Pap N, DO   2 tablet at 07/17/20 7425    Allergies as of 07/16/2020  . (No Known Allergies)    History reviewed. No pertinent family history.  Social History   Socioeconomic History  . Marital status: Single    Spouse name: Not on file  . Number of children: Not on file  . Years of education: Not on file  . Highest education level: Not on file  Occupational History  . Not on file  Tobacco Use  . Smoking status: Every Day    Packs/day: 1.00    Years: 20.00    Pack years: 20.00    Types: Cigarettes  . Smokeless tobacco: Never  Substance and Sexual Activity  . Alcohol use: No  . Drug use: No  . Sexual activity: Not on file  Other Topics Concern  . Not on file  Social History Narrative  . Not on file   Social Determinants of Health   Financial Resource Strain: Not on file  Food Insecurity: Not on file  Transportation Needs: Not on file  Physical Activity: Not on file  Stress: Not on file  Social Connections: Not on file  Intimate Partner Violence: Not on file    Review of  Systems: Pertinent positive and negative review of systems were noted in the above HPI section.  All other review of systems was otherwise negative.   Physical Exam: Vital signs in last 24 hours: Temp:  [98 F (36.7 C)-99.1 F (37.3 C)] 98 F (36.7 C) (06/26 1057) Pulse Rate:  [11-110] 11 (06/26 1057) Resp:  [16-29] 17 (06/26 1057) BP: (129-185)/(80-106) 176/92 (06/26 1057) SpO2:  [90 %-100 %] 94 % (06/26 1057) Weight:  [57.3 kg] 57.3 kg (06/26 0313) Last BM Date: 07/16/20 General:  Alert,  Well-developed, ill-appearing 60 year old African-American male, pleasant and cooperative in NAD Head:  Normocephalic and atraumatic. Eyes:  Sclera clear, no icterus.   Conjunctiva pink. Ears:  Normal auditory acuity. Nose:  No deformity, discharge,  or lesions. Mouth:  No deformity or lesions.   Neck:  Supple; no masses or thyromegaly. Lungs:  Clear throughout to auscultation.   No wheezes, crackles, or rhonchi.  Heart:  Regular rate and rhythm; no murmurs, clicks, rubs,  or gallops. Abdomen:  Soft, bowel sounds are present, there is no palpable mass or hepatosplenomegaly, some generalized fullness, no fluid wave, he is tender in the hypogastrium and periumbilical area Rectal: Not done Msk:  Symmetrical without gross deformities. . Pulses:  Normal pulses noted. Extremities:  Without clubbing or edema. Neurologic:  Alert and  oriented x4;  grossly normal neurologically. Skin:  Intact without significant lesions or rashes.. Psych:  Alert and cooperative. Normal mood and affect.  Intake/Output from previous day: 06/25 0701 - 06/26 0700 In: 120 [P.O.:120] Out: -  Intake/Output this shift: Total I/O In: 262.2 [I.V.:262.2] Out: -   Lab Results: Recent Labs    07/16/20 1855 07/17/20 0437  WBC 8.6 9.0  HGB 11.7* 11.3*  HCT 35.0* 34.0*  PLT 163 163   BMET Recent Labs    07/16/20 1855 07/17/20 0437  NA 136 137  K 3.8 4.0  CL 101 101  CO2 27 24  GLUCOSE 109* 99  BUN 13 11   CREATININE 1.22 1.03  CALCIUM 9.1 9.0   LFT Recent Labs    07/17/20 0437  PROT 6.5  ALBUMIN 3.3*  AST 10*  ALT 8  ALKPHOS 50  BILITOT 1.0  BILIDIR 0.2  IBILI 0.8   PT/INR No results for input(s): LABPROT, INR in the last 72 hours. Hepatitis Panel No results for input(s): HEPBSAG, HCVAB, HEPAIGM, HEPBIGM in the last 72 hours.   IMPRESSION:   #68 60 year old African-American male with COPD, admitted with several week history of progressive chest and back pain which has been constant, associated with decrease in appetite, 30 pound weight loss and complaints of dysphagia and odynophagia. CT imaging shows marked diffuse esophageal wall thickening and haziness as well as some posterior mediastinal haziness and soft tissue density abutting the trachea. There is also a small lesion in the pancreatic head and 2 ill-defined densities in the liver  Etiology of symptoms is not clear, concerning for underlying malignancy, though diffuse esophageal wall thickening somewhat unusual may also have associated esophagitis With small pancreatic lesion and hepatic lesions rule out metastatic disease, consider primary pancreatic neoplasm  #2 anemia most consistent with anemia of chronic disease 3# COPD  Plan clear liquid diet, n.p.o. after midnight Patient will be scheduled for upper endoscopy with Dr. Silverio Decamp tomorrow.  Procedure was discussed in detail with the patient including indications risks and benefits and he is agreeable to proceed Increase Protonix to 40 mg IV twice daily Continue antacids and viscous lidocaine CEA/CA 19-9 Await results of MRI of the pancreas/liver later today  Thank you GI will follow with you    Amy Esterwood PA-C 07/17/2020, 11:01 AM     [facility] Attending   I have taken an interval history, reviewed the chart and examined the patient. I agree with the Advanced Practitioner's note, impression and recommendations.    Constellation of signs and symptoms  that are concerning for some sort of malignancy or malignancies.  EGD tomorrow we will see what the other imaging and labs tell us  as far as other steps as well.  Gatha Mayer, MD, [facility] 07/17/2020 6:21 PM

## 2020-07-17 NOTE — Progress Notes (Signed)
I started the [facility] and within 5 minutes, patient began scratching a lot and complaining of sudden itching all over body. No breathing issues, no other complaints. I stopped Faraheme immediately and paged Dr. Roderic Palau. Awaiting return of call

## 2020-07-17 NOTE — H&P (View-Only) (Signed)
Consultation  Referring Provider: TRH/ Memon  Primary Care Physician:  Pcp, No Primary Gastroenterologist:  none  Reason for Consultation: Abnormal imaging of the esophagus, chest pain  HPI: Christian Newton is a 60 y.o. male ,, who was admitted yesterday after he presented to the emergency room with complaints of chest pain, back pain and abdominal pain.  He says he has been feeling poorly over the past several weeks, has been having both chest and back pain which has gotten progressively worse and has been fairly constant.  Along with that he has had a significant decrease in appetite and has lost about 30 pounds.  He is complaining of dysphagia and odynophagia primarily to solids, has been able to get liquids down.  He says that solid foods feel as if they are sitting in his esophagus and frequently will come back up, he has not had any hematemesis, unaware of any fever or chills.  He has also developed abdominal pain which is vague but seems to be around the periumbilical area, no changes in bowel habits, unaware of any melena or hematochezia. No regular EtOH, no regular aspirin or NSAIDs, no blood thinners.  He has not had any prior GI evaluation.  Other medical problems include COPD, continued tobacco use, no O2 use. Dilation in the emergency room with CT angio of the chest abdomen and pelvis shows marked diffuse esophageal wall thickening and haziness as well as surrounding posterior mediastinal haziness and fat stranding findings concerning for malignancy versus inflammation, mediastinal adenopathy, question 1.6 cm proximal pancreatic mass Limited evaluation due to timing of contrast and indeterminate 1.5 cm and another 3.1 cm poorly defined left hepatic lobe hypodensities, MRI recommended.  Labs WBC 8.6/hemoglobin 11.7/hematocrit 35/MCV 79 BNP 66 COVID screen negative, LFTs within normal limits D-dimer normal BUN 13/creatinine 1.22  Ferritin 233 Iron 11/TIBC 210/iron sat  5.  Patient is scheduled to have MRI of the abdomen later today     Past Medical History:  Diagnosis Date  . Cat bite of left hand 05/11/2015  . COPD (chronic obstructive pulmonary disease) ([place])   . Smoker     Past Surgical History:  Procedure Laterality Date  . APPENDECTOMY    . I & D EXTREMITY Left 05/13/2015   Procedure: INCISION AND DRAINAGE OF LEFT EXTREMITY;  Surgeon: Charlotte Crumb, MD;  Location: [facility];  Service: Orthopedics;  Laterality: Left;  . INCISION AND DRAINAGE Left 05/13/2015    Prior to Admission medications   Medication Sig Start Date End Date Taking? Authorizing Provider  acetaminophen (TYLENOL) 500 MG tablet Take 500 mg by mouth every 6 (six) hours as needed for mild pain or headache.   Yes [provider]  albuterol (PROVENTIL HFA;VENTOLIN HFA) 108 (90 BASE) MCG/ACT inhaler Inhale 2 puffs into the lungs every 6 (six) hours as needed for wheezing or shortness of breath. 10/21/14 05/12/15  Velvet Bathe, MD    Current Facility-Administered Medications  Medication Dose Route Frequency Provider Last Rate Last Admin  . acetaminophen (TYLENOL) tablet 650 mg  650 mg Oral Q8H PRN Irene Pap N, DO      . alum & mag hydroxide-simeth (MAALOX/MYLANTA) 200-200-20 MG/5ML suspension 30 mL  30 mL Oral TID Kathie Dike, MD       And  . lidocaine (XYLOCAINE) 2 % viscous mouth solution 15 mL  15 mL Oral TID Kathie Dike, MD      . budesonide (PULMICORT) nebulizer solution 0.25 mg  0.25 mg Nebulization BID Kathie Dike,  MD      . cyanocobalamin ((VITAMIN B-12)) injection 1,000 mcg  1,000 mcg Intramuscular Once Kathie Dike, MD      . enoxaparin (LOVENOX) injection 40 mg  40 mg Subcutaneous QHS Hall, Carole N, DO   40 mg at 07/17/20 0113  . feeding supplement (ENSURE ENLIVE / ENSURE PLUS) liquid 237 mL  237 mL Oral BID BM Hall, Carole N, DO   237 mL at 07/17/20 0931  . ferumoxytol (FERAHEME) 510 mg in sodium chloride 0.9 % 100 mL IVPB  510 mg Intravenous  Once Kathie Dike, MD      . ipratropium-albuterol (DUONEB) 0.5-2.5 (3) MG/3ML nebulizer solution 3 mL  3 mL Nebulization Q6H Memon, Jehanzeb, MD      . lactated ringers infusion   Intravenous Continuous Kayleen Memos, DO 75 mL/hr at 07/17/20 0320 New Bag at 07/17/20 0320  . melatonin tablet 3 mg  3 mg Oral QHS PRN Irene Pap N, DO      . metoprolol tartrate (LOPRESSOR) tablet 25 mg  25 mg Oral BID Kathie Dike, MD      . morphine 2 MG/ML injection 2 mg  2 mg Intravenous Q4H PRN Irene Pap N, DO   2 mg at 07/17/20 0335  . oxyCODONE (Oxy IR/ROXICODONE) immediate release tablet 5 mg  5 mg Oral Q6H PRN Hall, Carole N, DO      . pantoprazole (PROTONIX) injection 40 mg  40 mg Intravenous Q24H Memon, Jolaine Artist, MD      . polyethylene glycol (MIRALAX / GLYCOLAX) packet 17 g  17 g Oral Daily PRN Irene Pap N, DO      . senna-docusate (Senokot-S) tablet 2 tablet  2 tablet Oral QHS Irene Pap N, DO   2 tablet at 07/17/20 9794    Allergies as of 07/16/2020  . (No Known Allergies)    History reviewed. No pertinent family history.  Social History   Socioeconomic History  . Marital status: Single    Spouse name: Not on file  . Number of children: Not on file  . Years of education: Not on file  . Highest education level: Not on file  Occupational History  . Not on file  Tobacco Use  . Smoking status: Every Day    Packs/day: 1.00    Years: 20.00    Pack years: 20.00    Types: Cigarettes  . Smokeless tobacco: Never  Substance and Sexual Activity  . Alcohol use: No  . Drug use: No  . Sexual activity: Not on file  Other Topics Concern  . Not on file  Social History Narrative  . Not on file   Social Determinants of Health   Financial Resource Strain: Not on file  Food Insecurity: Not on file  Transportation Needs: Not on file  Physical Activity: Not on file  Stress: Not on file  Social Connections: Not on file  Intimate Partner Violence: Not on file    Review of  Systems: Pertinent positive and negative review of systems were noted in the above HPI section.  All other review of systems was otherwise negative.   Physical Exam: Vital signs in last 24 hours: Temp:  [98 F (36.7 C)-99.1 F (37.3 C)] 98 F (36.7 C) (06/26 1057) Pulse Rate:  [11-110] 11 (06/26 1057) Resp:  [16-29] 17 (06/26 1057) BP: (129-185)/(80-106) 176/92 (06/26 1057) SpO2:  [90 %-100 %] 94 % (06/26 1057) Weight:  [57.3 kg] 57.3 kg (06/26 0313) Last BM Date: 07/16/20 General:  Alert,  Well-developed, ill-appearing 60 year old African-American male, pleasant and cooperative in NAD Head:  Normocephalic and atraumatic. Eyes:  Sclera clear, no icterus.   Conjunctiva pink. Ears:  Normal auditory acuity. Nose:  No deformity, discharge,  or lesions. Mouth:  No deformity or lesions.   Neck:  Supple; no masses or thyromegaly. Lungs:  Clear throughout to auscultation.   No wheezes, crackles, or rhonchi.  Heart:  Regular rate and rhythm; no murmurs, clicks, rubs,  or gallops. Abdomen:  Soft, bowel sounds are present, there is no palpable mass or hepatosplenomegaly, some generalized fullness, no fluid wave, he is tender in the hypogastrium and periumbilical area Rectal: Not done Msk:  Symmetrical without gross deformities. . Pulses:  Normal pulses noted. Extremities:  Without clubbing or edema. Neurologic:  Alert and  oriented x4;  grossly normal neurologically. Skin:  Intact without significant lesions or rashes.. Psych:  Alert and cooperative. Normal mood and affect.  Intake/Output from previous day: 06/25 0701 - 06/26 0700 In: 120 [P.O.:120] Out: -  Intake/Output this shift: Total I/O In: 262.2 [I.V.:262.2] Out: -   Lab Results: Recent Labs    07/16/20 1855 07/17/20 0437  WBC 8.6 9.0  HGB 11.7* 11.3*  HCT 35.0* 34.0*  PLT 163 163   BMET Recent Labs    07/16/20 1855 07/17/20 0437  NA 136 137  K 3.8 4.0  CL 101 101  CO2 27 24  GLUCOSE 109* 99  BUN 13 11   CREATININE 1.22 1.03  CALCIUM 9.1 9.0   LFT Recent Labs    07/17/20 0437  PROT 6.5  ALBUMIN 3.3*  AST 10*  ALT 8  ALKPHOS 50  BILITOT 1.0  BILIDIR 0.2  IBILI 0.8   PT/INR No results for input(s): LABPROT, INR in the last 72 hours. Hepatitis Panel No results for input(s): HEPBSAG, HCVAB, HEPAIGM, HEPBIGM in the last 72 hours.   IMPRESSION:   #57 60 year old African-American male with COPD, admitted with several week history of progressive chest and back pain which has been constant, associated with decrease in appetite, 30 pound weight loss and complaints of dysphagia and odynophagia. CT imaging shows marked diffuse esophageal wall thickening and haziness as well as some posterior mediastinal haziness and soft tissue density abutting the trachea. There is also a small lesion in the pancreatic head and 2 ill-defined densities in the liver  Etiology of symptoms is not clear, concerning for underlying malignancy, though diffuse esophageal wall thickening somewhat unusual may also have associated esophagitis With small pancreatic lesion and hepatic lesions rule out metastatic disease, consider primary pancreatic neoplasm  #2 anemia most consistent with anemia of chronic disease 3# COPD  Plan clear liquid diet, n.p.o. after midnight Patient will be scheduled for upper endoscopy with Dr. Silverio Decamp tomorrow.  Procedure was discussed in detail with the patient including indications risks and benefits and he is agreeable to proceed Increase Protonix to 40 mg IV twice daily Continue antacids and viscous lidocaine CEA/CA 19-9 Await results of MRI of the pancreas/liver later today  Thank you GI will follow with you    Amy Esterwood PA-C 07/17/2020, 11:01 AM     [facility] Attending   I have taken an interval history, reviewed the chart and examined the patient. I agree with the Advanced Practitioner's note, impression and recommendations.    Constellation of signs and symptoms  that are concerning for some sort of malignancy or malignancies.  EGD tomorrow we will see what the other imaging and labs tell us  as far as other steps as well.  Gatha Mayer, MD, [facility] 07/17/2020 6:21 PM

## 2020-07-17 NOTE — Progress Notes (Signed)
Patient admitted to the hospital earlier this morning by Dr. [place] Crane  Patient seen and examined. He describes on going chest discomfort. Reports that it is worse when he eats and has had episodes of vomiting after eating. He reports a weight loss of approximately 30 pounds in the past 2 weeks.  Symptoms have been present for at least 2 weeks.  Denies any NSAID use.  He does take Tylenol for pain.  He also has periumbilical abdominal pain.  Denies any melena or hematochezia.  He also has had shortness of breath for the past 2 weeks.  Denies any wheezing.  He has long history of tobacco use.  He does not have a primary care physician, does not take any chronic medications and has not sought out medical care in quite a while.  Assessment/plan:  Chest pain. -Ruled out for ACS with negative cardiac markers, also symptoms are atypical for cardiac chest pain since they have been present consistently for 2 weeks -D-dimer is also negative -Suspect his symptoms are more GI related -We will give a trial with GI cocktail  Diffuse esophageal wall thickening -Noted on CT scan -Is also commented on mediastinal lymphadenopathy -Could be related to inflammation, but in current context with weight loss, there certainly is a concern for malignancy -GI consulted for endoscopy -Stool for FOBT has been ordered -started on PPI  Pancreatic mass/left hepatic lobe hypodensity -Concern for underlying malignancy -Will need to be further evaluated with MRI pancreatic protocol  Anemia -Multifactorial -Patient noted to have iron deficiency as well as B12 deficiency -We will start replacement therapy  Acute respiratory failure with hypoxia -ED notes indicate that his oxygen saturations dropped to 88% on room air on ambulation -He is currently stable on 2 L of oxygen -D-dimer negative, BNP normal, no evidence of pneumonia on CT scan. -With his long history of tobacco use, I suspect he may have an underlying element of  COPD -He will need follow-up with pulmonology for PFTs -We will start on bronchodilator/inhaled steroids for now  Elevated blood pressure -He does have some mild tachycardia as well -We will start on some beta-blockers  Christian Newton

## 2020-07-17 NOTE — H&P (Signed)
History and Physical  Ozie Lupe MLY:[id number] DOB: 20-Mar-1960 DOA: 07/16/2020  Referring physician: Arlean Hopping, PA-EDP  PCP: Pcp, No  Outpatient Specialists: None Patient coming from: Home   Chief Complaint: Chest pain   HPI: Christian Newton is a 60 y.o. male with medical history significant for ongoing tobacco use disorder who presented to [facility] ED from home due to complaints of chest pain with onset  2 weeks ago intermittently.  His pain has been worsening and constant in the past 2 days.  Associated with exertional dyspnea, upper abdominal and back pain.  Reports 10 to 15 pound unintentional weight loss in 1 week.  He smokes about 1 to 2 packs/week.  Prior history of alcohol abuse, quit 6 years ago.  Denies any constitutional symptoms, denies subjective fevers, chills, or night sweats.  He presented to the ED for further evaluation.   Due to concern for dissection he had CT chest/abd/pelvis with and without contrast which revealed incidentally found marked diffuse esophageal wall thickening and haziness as well as surrounding posterior mediastinal haziness/fat stranding.  Findings concerning for malignancy versus inflammation.  Recommended direct visualization.  Mediastinal lymphadenopathy.  Question 1.6 cm proximal pancreatic mass with markedly limited evaluation due to timing of contrast.  Concern for malignancy.  Recommended MRI pancreatic protocol for further evaluation.  Indeterminate 1.5 cm as well as another 3.1 cm poorly defined left hepatic lobe hypodensity.  This finding can be further evaluated on MRI pancreatic protocol.  Due to concern for suspected malignancy, TRH, hospitalist team was asked to admit.  ED Course:  Temperature 98.8.  BP 143/86, pulse 102, respiratory 17, O2 saturation 98% on room air.  Lab studies remarkable for serum sodium 136, potassium 3.8, glucose 109, BUN 13, creatinine 1.22, GFR greater than 60.  Troponin 7, 8.  BNP 66.  WBC 8.6, hemoglobin 11.7, MCV 79.   Platelet 163.  Review of Systems: Review of systems as noted in the HPI. All other systems reviewed and are negative.   Past Medical History:  Diagnosis Date   Cat bite of left hand 05/11/2015   COPD (chronic obstructive pulmonary disease) (HCC)    Smoker    Past Surgical History:  Procedure Laterality Date   APPENDECTOMY     I & D EXTREMITY Left 05/13/2015   Procedure: INCISION AND DRAINAGE OF LEFT EXTREMITY;  Surgeon: Charlotte Crumb, MD;  Location: [facility];  Service: Orthopedics;  Laterality: Left;   INCISION AND DRAINAGE Left 05/13/2015    Social History:  reports that he has been smoking cigarettes. He has a 20.00 pack-year smoking history. He has never used smokeless tobacco. He reports that he does not drink alcohol and does not use drugs.   No Known Allergies  Family history: Mother deceased, history of cancer, patient does not know which cancer Father deceased, history of cancer, patient does not know which cancer.  Prior to Admission medications   Medication Sig Start Date End Date Taking? Authorizing Provider  acetaminophen (TYLENOL) 500 MG tablet Take 500 mg by mouth every 6 (six) hours as needed for mild pain or headache.   Yes [provider]  albuterol (PROVENTIL HFA;VENTOLIN HFA) 108 (90 BASE) MCG/ACT inhaler Inhale 2 puffs into the lungs every 6 (six) hours as needed for wheezing or shortness of breath. 10/21/14 05/12/15  Velvet Bathe, MD    Physical Exam: BP 129/85   Pulse (!) 102   Temp 98.8 F (37.1 C)   Resp 17   SpO2 99%   General:  60 y.o. year-old male frail-appearing in no acute distress.  Alert and oriented x3. Cardiovascular: Tachycardic with no rubs or gallops.  No thyromegaly or JVD noted.  No lower extremity edema. 2/4 pulses in all 4 extremities. Respiratory: Clear to auscultation with no wheezes or rales. Good inspiratory effort. Abdomen: Soft nontender nondistended with normal bowel sounds x4 quadrants. Muskuloskeletal: No cyanosis,  clubbing or edema noted bilaterally Neuro: CN II-XII intact, strength, sensation, reflexes Skin: No ulcerative lesions noted or rashes Psychiatry: Judgement and insight appear normal. Mood is appropriate for condition and setting          Labs on Admission:  Basic Metabolic Panel: Recent Labs  Lab 07/16/20 1855  NA 136  K 3.8  CL 101  CO2 27  GLUCOSE 109*  BUN 13  CREATININE 1.22  CALCIUM 9.1   Liver Function Tests: Recent Labs  Lab 07/16/20 1855  AST <5*  ALT 9  ALKPHOS 53  BILITOT 1.1  PROT 7.0  ALBUMIN 3.6   No results for input(s): LIPASE, AMYLASE in the last 168 hours. No results for input(s): AMMONIA in the last 168 hours. CBC: Recent Labs  Lab 07/16/20 1855  WBC 8.6  NEUTROABS 7.0  HGB 11.7*  HCT 35.0*  MCV 79.4*  PLT 163   Cardiac Enzymes: No results for input(s): CKTOTAL, CKMB, CKMBINDEX, TROPONINI in the last 168 hours.  BNP (last 3 results) Recent Labs    07/16/20 1855  BNP 66.1    ProBNP (last 3 results) No results for input(s): PROBNP in the last 8760 hours.  CBG: No results for input(s): GLUCAP in the last 168 hours.  Radiological Exams on Admission: DG Chest 2 View  Result Date: 07/16/2020 CLINICAL DATA:  Left-sided chest pain and back pain EXAM: CHEST - 2 VIEW COMPARISON:  10/20/2014 FINDINGS: The heart size and mediastinal contours are within normal limits. Pulmonary hyperinflation. The visualized skeletal structures are unremarkable. IMPRESSION: Pulmonary hyperinflation without acute abnormality of the lungs. Electronically Signed   By: Eddie Candle M.D.   On: 07/16/2020 20:14   CT Angio Chest/Abd/Pel for Dissection W and/or Wo Contrast  Result Date: 07/16/2020 CLINICAL DATA:  Abdominal pain, aortic dissection suspected EXAM: CT ANGIOGRAPHY CHEST, ABDOMEN AND PELVIS TECHNIQUE: Non-contrast CT of the chest was initially obtained. Multidetector CT imaging through the chest, abdomen and pelvis was performed using the standard protocol  during bolus administration of intravenous contrast. Multiplanar reconstructed images and MIPs were obtained and reviewed to evaluate the vascular anatomy. CONTRAST:  153mL OMNIPAQUE IOHEXOL 350 MG/ML SOLN COMPARISON:  CT angiography chest 10/20/2014 FINDINGS: CTA CHEST FINDINGS Cardiovascular: Satisfactory opacification of the pulmonary arteries to the segmental level. No evidence of pulmonary embolism. Normal heart size. No pericardial effusion. Mediastinum/Nodes: Limited evaluation for mediastinal lymphadenopathy due to timing of contrast. Question subcarinal lymph node. No enlarged hilar or axillary lymph nodes. Thyroid gland and trachea demonstrate no significant findings. Marked diffuse esophageal wall thickening and haziness. Associated posterior mediastinal haziness and soft tissue density. Soft tissue density in haziness abuts the posterior trachea and carina. Lungs/Pleura: No pulmonary nodule. No pulmonary mass. Thin consolidation along the pleura within the posterior right middle lobe. No pleural effusion. No pneumothorax. Musculoskeletal: No chest wall abnormality. No suspicious lytic or blastic osseous lesions. No acute displaced fracture. Multilevel degenerative changes of the spine. Review of the MIP images confirms the above findings. CTA ABDOMEN AND PELVIS FINDINGS VASCULAR Aorta: At least moderate atherosclerotic plaque. Normal caliber aorta without aneurysm, dissection, vasculitis or significant stenosis. Celiac:  Mild atherosclerotic plaque. Patent without evidence of aneurysm, dissection, vasculitis or significant stenosis. SMA: Mild atherosclerotic plaque. Patent without evidence of aneurysm, dissection, vasculitis or significant stenosis. Renals: Both renal arteries are patent without evidence of aneurysm, dissection, vasculitis, fibromuscular dysplasia or significant stenosis. IMA: Patent without evidence of aneurysm, dissection, vasculitis or significant stenosis. Inflow: At least moderate  atherosclerotic plaque. Patent without evidence of aneurysm, dissection, vasculitis or significant stenosis. Veins: No obvious venous abnormality within the limitations of this arterial phase study. Review of the MIP images confirms the above findings. NON-VASCULAR Hepatobiliary: There is a 1.5 cm hypodensity within the left hepatic lobe (7:128) as well as a poorly defined 3.1 cm hypodensity within the inferior left lateral hepatic lobe (7:168). No gallstones, gallbladder wall thickening, or pericholecystic fluid. No biliary dilatation. Pancreas: Question 1.6 cm proximal pancreatic mass with markedly limited evaluation due to timing of contrast. Spleen: Normal in size without focal abnormality. Adrenals/Urinary Tract: No adrenal nodule bilaterally. Bilateral kidneys enhance symmetrically. There is a 1.3 cm fluid density lesion within the left kidney that likely represents a simple renal cyst. Subcentimeter hypodensities are too small to characterize. No hydronephrosis. No hydroureter. The urinary bladder is unremarkable. Stomach/Bowel: Stomach is within normal limits. No evidence of bowel wall thickening or dilatation. Lymphatic: No definite lymphadenopathy. Reproductive: Prostate is unremarkable. Other: No intraperitoneal free fluid. No intraperitoneal free gas. No organized fluid collection. Musculoskeletal: No abdominal wall hernia or abnormality. No suspicious lytic or blastic osseous lesions. No acute displaced fracture. Multilevel degenerative changes of the spine. Review of the MIP images confirms the above findings. IMPRESSION: 1. No acute vascular abnormality. 2. Marked diffuse esophageal wall thickening and haziness as well as surrounding posterior mediastinal haziness/fat stranding. Findings concerning for malignancy versus inflammation. Recommend direct visualization. 3. Mediastinal lymphadenopathy. 4. Question 1.6 cm proximal pancreatic mass with markedly limited evaluation due to timing of contrast.  Concern for malignancy. Recommend MRI pancreatic protocol for further evaluation. 5. Indeterminate 1.5 cm as well as another 3.1 cm poorly defined left hepatic lobe hypodensity. These finding can be further evaluated on MRI pancreatic protocol. Electronically Signed   By: Iven Finn M.D.   On: 07/16/2020 23:42    EKG: I independently viewed the EKG done and my findings are as followed: Sinus tachycardia rate of 114.  Nonspecific ST-T changes.  QTc 463.  Assessment/Plan Present on Admission:  Chest pain  Active Problems:   Chest pain  Chest pain, ACS ruled out. 2 sets of troponin negative No evidence of acute ischemia on twelve-lead EKG. CT chest/abdomen pelvis with and without contrast negative for any vascular acute abnormalities.  No evidence of pulmonary embolism.  Chronic microcytic anemia/anemia of chronic disease Anemia work up/Iron studies in process Obtain FOBT GI consult to r/o GI causes  Unintentional weight loss Self-reported 10 to 15 pound unintentional weight loss in the past 1 week Poor appetite, poor oral intake. Possible underlying malignancy  Marked diffuse esophageal wall thickening and haziness as well as surrounding posterior mediastinal haziness/fat stranding. Could be a source of his chest pain Start IV protonix 40 mg BID Consult GI in the AM  Mediastinal lymphadenopathy/1.6 cm proximal pancreatic mass, concern for malignancy.   Recommended MRI pancreatic protocol for further evaluation.    Indeterminate 1.5 cm as well as another 3.1 cm poorly defined left hepatic lobe hypodensity, unclear etiology.     DVT prophylaxis: SQ Lovenox daily   Code Status: Full   Family Communication: None at bedside.   Disposition Plan: Admit  to telemetry medical   Consults called: Please consult GI in the AM   Admission status: Observation    Status is: Observation   Dispo:  Patient From: Home  Planned Disposition: Home when GI signs off.  Medically  stable for discharge: No      Kayleen Memos MD [facility] Pager [phone]  If 7PM-7AM, please contact night-coverage [email or web address] Password [email or web address]  07/17/2020, 1:15 AM

## 2020-07-17 NOTE — ED Notes (Signed)
RN attempted to call report x1 

## 2020-07-18 ENCOUNTER — Observation Stay (HOSPITAL_COMMUNITY): Payer: Medicaid Other | Admitting: Certified Registered"

## 2020-07-18 ENCOUNTER — Encounter (HOSPITAL_COMMUNITY)
Admission: EM | Disposition: A | Discharge status: Home / Self Care | Payer: Self-pay | Source: Home / Self Care | Attending: Internal Medicine

## 2020-07-18 ENCOUNTER — Encounter (HOSPITAL_COMMUNITY): Payer: Self-pay | Admitting: Internal Medicine

## 2020-07-18 DIAGNOSIS — R1312 Dysphagia, oropharyngeal phase: Secondary | ICD-10-CM | POA: Diagnosis present

## 2020-07-18 DIAGNOSIS — R0902 Hypoxemia: Secondary | ICD-10-CM | POA: Diagnosis present

## 2020-07-18 DIAGNOSIS — D638 Anemia in other chronic diseases classified elsewhere: Secondary | ICD-10-CM | POA: Diagnosis present

## 2020-07-18 DIAGNOSIS — E538 Deficiency of other specified B group vitamins: Secondary | ICD-10-CM | POA: Diagnosis present

## 2020-07-18 DIAGNOSIS — F17210 Nicotine dependence, cigarettes, uncomplicated: Secondary | ICD-10-CM | POA: Diagnosis present

## 2020-07-18 DIAGNOSIS — Z20822 Contact with and (suspected) exposure to covid-19: Secondary | ICD-10-CM | POA: Diagnosis present

## 2020-07-18 DIAGNOSIS — R0789 Other chest pain: Secondary | ICD-10-CM | POA: Diagnosis present

## 2020-07-18 DIAGNOSIS — E43 Unspecified severe protein-calorie malnutrition: Secondary | ICD-10-CM | POA: Diagnosis present

## 2020-07-18 DIAGNOSIS — J449 Chronic obstructive pulmonary disease, unspecified: Secondary | ICD-10-CM | POA: Diagnosis present

## 2020-07-18 DIAGNOSIS — R030 Elevated blood-pressure reading, without diagnosis of hypertension: Secondary | ICD-10-CM | POA: Diagnosis present

## 2020-07-18 DIAGNOSIS — R590 Localized enlarged lymph nodes: Secondary | ICD-10-CM | POA: Diagnosis present

## 2020-07-18 DIAGNOSIS — J9601 Acute respiratory failure with hypoxia: Secondary | ICD-10-CM | POA: Diagnosis present

## 2020-07-18 DIAGNOSIS — Z681 Body mass index (BMI) 19 or less, adult: Secondary | ICD-10-CM | POA: Diagnosis not present

## 2020-07-18 DIAGNOSIS — C155 Malignant neoplasm of lower third of esophagus: Secondary | ICD-10-CM | POA: Diagnosis present

## 2020-07-18 DIAGNOSIS — Z9049 Acquired absence of other specified parts of digestive tract: Secondary | ICD-10-CM | POA: Diagnosis not present

## 2020-07-18 DIAGNOSIS — K2289 Other specified disease of esophagus: Secondary | ICD-10-CM | POA: Diagnosis present

## 2020-07-18 DIAGNOSIS — D490 Neoplasm of unspecified behavior of digestive system: Secondary | ICD-10-CM

## 2020-07-18 DIAGNOSIS — K869 Disease of pancreas, unspecified: Secondary | ICD-10-CM | POA: Diagnosis present

## 2020-07-18 DIAGNOSIS — C787 Secondary malignant neoplasm of liver and intrahepatic bile duct: Secondary | ICD-10-CM | POA: Diagnosis present

## 2020-07-18 HISTORY — PX: ESOPHAGOGASTRODUODENOSCOPY (EGD) WITH PROPOFOL: SHX5813

## 2020-07-18 HISTORY — PX: BIOPSY: SHX5522

## 2020-07-18 LAB — CBC
HCT: 38.2 % — ABNORMAL LOW (ref 39.0–52.0)
Hemoglobin: 12.6 g/dL — ABNORMAL LOW (ref 13.0–17.0)
MCH: 26 pg (ref 26.0–34.0)
MCHC: 33 g/dL (ref 30.0–36.0)
MCV: 78.8 fL — ABNORMAL LOW (ref 80.0–100.0)
Platelets: 165 10*3/uL (ref 150–400)
RBC: 4.85 MIL/uL (ref 4.22–5.81)
RDW: 13.1 % (ref 11.5–15.5)
WBC: 8.5 10*3/uL (ref 4.0–10.5)
nRBC: 0 % (ref 0.0–0.2)

## 2020-07-18 LAB — BASIC METABOLIC PANEL
Anion gap: 11 (ref 5–15)
BUN: 14 mg/dL (ref 8–23)
CO2: 27 mmol/L (ref 22–32)
Calcium: 9.4 mg/dL (ref 8.9–10.3)
Chloride: 99 mmol/L (ref 98–111)
Creatinine, Ser: 1.14 mg/dL (ref 0.61–1.24)
GFR, Estimated: >60 mL/min (ref >60)
Glucose, Bld: 111 mg/dL — ABNORMAL HIGH (ref 70–99)
Potassium: 4.6 mmol/L (ref 3.5–5.1)
Sodium: 137 mmol/L (ref 135–145)

## 2020-07-18 SURGERY — ESOPHAGOGASTRODUODENOSCOPY (EGD) WITH PROPOFOL
Anesthesia: General

## 2020-07-18 MED ORDER — LIDOCAINE 2% (20 MG/ML) 5 ML SYRINGE
INTRAMUSCULAR | Status: DC | PRN
  Administered 2020-07-18: 80 mg via INTRAVENOUS

## 2020-07-18 MED ORDER — PROPOFOL 10 MG/ML IV BOLUS: INTRAVENOUS | Status: DC | PRN

## 2020-07-18 MED ORDER — LACTATED RINGERS IV SOLN: INTRAVENOUS | Status: DC | PRN

## 2020-07-18 MED ORDER — GLYCOPYRROLATE PF 0.2 MG/ML IJ SOSY
PREFILLED_SYRINGE | INTRAMUSCULAR | Status: DC | PRN
  Administered 2020-07-18: .2 mg via INTRAVENOUS

## 2020-07-18 MED ORDER — PROPOFOL 500 MG/50ML IV EMUL
INTRAVENOUS | Status: DC | PRN
  Administered 2020-07-18: 200 ug/kg/min via INTRAVENOUS

## 2020-07-18 MED ORDER — HYDROMORPHONE HCL 1 MG/ML IJ SOLN
1.0000 mg | INTRAMUSCULAR | Status: DC | PRN
  Administered 2020-07-18 – 2020-07-20 (×5): 1 mg via INTRAVENOUS
  Filled 2020-07-18 (×5): qty 1

## 2020-07-18 MED ORDER — PROPOFOL 10 MG/ML IV BOLUS
INTRAVENOUS | Status: DC | PRN
  Administered 2020-07-18: 80 mg via INTRAVENOUS

## 2020-07-18 SURGICAL SUPPLY — 15 items

## 2020-07-18 NOTE — Progress Notes (Signed)
PROGRESS NOTE    Christian Newton  NKN:[id number] DOB: January 05, 1961 DOA: 07/16/2020 PCP: Pcp, No    Brief Narrative:  60 year old male with a history of tobacco use, admitted to the hospital with complaints of chest discomfort and shortness of breath.  Described persistent chest pain radiating to his back for the past 2 weeks.  He is also had unintentional weight loss.  CT scan indicated esophageal thickening.  Seen by GI and underwent EGD that indicated esophageal mass.  Further imaging also indicates possible pancreatic mass and hepatic metastases.  Currently biopsies are pending.   Assessment & Plan:   Active Problems:   Smoker   Chest pain   Pancreatic mass   Acute respiratory failure with hypoxia (HCC)   Unintentional weight loss   B12 deficiency   Iron deficiency   Abnormal CT scan, esophagus   Mass of esophagus   Mass of esophagus determined by endoscopy   Non-cardiac chest pain   Esophageal mass   Chest pain. -Ruled out for ACS with negative cardiac markers, also symptoms are atypical for cardiac chest pain since they have been present consistently for 2 weeks -D-dimer is also negative -Suspect his symptoms are more GI related    Esophageal mass -Seen by GI and underwent endoscopy -Biopsies pending, tumor markers have been sent -Will need oncology evaluation once biopsy results are available -started on PPI   Pancreatic mass/left hepatic lobe hypodensity -Concern for underlying malignancy -MRI imaging limited due to recent Feraheme -Would defer to GI regarding need for EUS   Anemia -Multifactorial -Patient noted to have iron deficiency as well as B12 deficiency -Received B12 replacement -1 dose of Feraheme was ordered, but this was discontinued within 5 minutes since he began to develop itching   Acute respiratory failure with hypoxia -ED notes indicate that his oxygen saturations dropped to 88% on room air on ambulation -He is currently stable on 2 L of  oxygen -D-dimer negative, BNP normal, no evidence of pneumonia on CT scan. -With his long history of tobacco use, I suspect he may have an underlying element of COPD -He will need follow-up with pulmonology for PFTs -Started on bronchodilators and inhaled steroids with some subjective improvement  Dysphagia -Reports that he starts coughing immediately after and drink liquids -Esophageal process may be contributing -We will get speech therapy evaluation   Elevated blood pressure -He does have some mild tachycardia as well -We will start on some beta-blockers  Back pain -Suspect this may be related to his underlying GI process -Continue pain management -PT evaluation   DVT prophylaxis: enoxaparin (LOVENOX) injection 40 mg Start: 07/17/20 0115 SCDs Start: 07/17/20 0102  Code Status: Full code Family Communication: Discussed with brother and sister at the bedside Disposition Plan: Status is: Inpatient  Remains inpatient appropriate because:Ongoing active pain requiring inpatient pain management and Ongoing diagnostic testing needed not appropriate for outpatient work up  Dispo:  Patient From: Home  Planned Disposition: Home  Medically stable for discharge: No           Consultants:  Gastroenterology  Procedures:  EGD: - Partially obstructing, rule out malignancy,                           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 Biopsied.                           -  Z-line regular, 44 cm from the incisors.                           - Normal stomach.                           - Normal examined duodenum  Antimicrobials:      Subjective: Complains of pain in his upper back, rating around from his chest, also has some low back pain  Objective: Vitals:   07/18/20 0820 07/18/20 0830 07/18/20 0835 07/18/20 1003  BP: (!) 143/94 (!) 161/91 (!) 168/90 (!) 165/105  Pulse: 93 92 93   Resp: (!) 29 (!) 23 (!) 27 19   Temp:    99 F (37.2 C)  TempSrc:    Oral  SpO2: 95% 97% 95%   Weight:      Height:        Intake/Output Summary (Last 24 hours) at 07/18/2020 1559 Last data filed at 07/18/2020 0811 Gross per 24 hour  Intake 570 ml  Output 825 ml  Net -255 ml   Filed Weights   07/17/20 0313 07/18/20 0703  Weight: 57.3 kg 57.2 kg    Examination:  General exam: Appears calm and comfortable  Respiratory system: Clear to auscultation. Respiratory effort normal. Cardiovascular system: S1 & S2 heard, RRR. No JVD, murmurs, rubs, gallops or clicks. No pedal edema. Gastrointestinal system: Abdomen is nondistended, soft and nontender. No organomegaly or masses felt. Normal bowel sounds heard. Central nervous system: Alert and oriented. No focal neurological deficits. Extremities: Symmetric 5 x 5 power. Skin: No rashes, lesions or ulcers Psychiatry: Judgement and insight appear normal. Mood & affect appropriate.     Data Reviewed: I have personally reviewed following labs and imaging studies  CBC: Recent Labs  Lab 07/16/20 1855 07/17/20 0437 07/18/20 0305  WBC 8.6 9.0 8.5  NEUTROABS 7.0  --   --   HGB 11.7* 11.3* 12.6*  HCT 35.0* 34.0* 38.2*  MCV 79.4* 78.9* 78.8*  PLT 163 163 194   Basic Metabolic Panel: Recent Labs  Lab 07/16/20 1855 07/17/20 0437 07/18/20 0305  NA 136 137 137  K 3.8 4.0 4.6  CL 101 101 99  CO2 27 24 27   GLUCOSE 109* 99 111*  BUN 13 11 14   CREATININE 1.22 1.03 1.14  CALCIUM 9.1 9.0 9.4  MG  --  1.8  --   PHOS  --  3.7  --    GFR: Estimated Creatinine Clearance: 54.4 mL/min (by C-G formula based on SCr of 1.14 mg/dL). Liver Function Tests: Recent Labs  Lab 07/16/20 1855 07/17/20 0437  AST <5* 10*  ALT 9 8  ALKPHOS 53 50  BILITOT 1.1 1.0  PROT 7.0 6.5  ALBUMIN 3.6 3.3*   Recent Labs  Lab 07/17/20 0437  LIPASE 21   No results for input(s): AMMONIA in the last 168 hours. Coagulation Profile: No results for input(s): INR, PROTIME in the last  168 hours. Cardiac Enzymes: No results for input(s): CKTOTAL, CKMB, CKMBINDEX, TROPONINI in the last 168 hours. BNP (last 3 results) No results for input(s): PROBNP in the last 8760 hours. HbA1C: No results for input(s): HGBA1C in the last 72 hours. CBG: No results for input(s): GLUCAP in the last 168 hours. Lipid Profile: No results for input(s): CHOL, HDL, LDLCALC, TRIG, CHOLHDL, LDLDIRECT in the last 72 hours. Thyroid Function Tests: No results for input(s): TSH, T4TOTAL, FREET4,  T3FREE, THYROIDAB in the last 72 hours. Anemia Panel: Recent Labs    07/17/20 0437  VITAMINB12 120*  FOLATE 8.7  FERRITIN 233  TIBC 210*  IRON 11*   Sepsis Labs: No results for input(s): PROCALCITON, LATICACIDVEN in the last 168 hours.  Recent Results (from the past 240 hour(s))  SARS CORONAVIRUS 2 (TAT 6-24 HRS) Nasopharyngeal Nasopharyngeal Swab     Status: None   Collection Time: 07/17/20  1:13 AM   Specimen: Nasopharyngeal Swab  Result Value Ref Range Status   SARS Coronavirus 2 NEGATIVE NEGATIVE Final    Comment: (NOTE) SARS-CoV-2 target nucleic acids are NOT DETECTED.  The SARS-CoV-2 RNA is generally detectable in upper and lower respiratory specimens during the acute phase of infection. Negative results do not preclude SARS-CoV-2 infection, do not rule out co-infections with other pathogens, and should not be used as the sole basis for treatment or other patient management decisions. Negative results must be combined with clinical observations, patient history, and epidemiological information. The expected result is Negative.  Fact Sheet for Patients: SugarRoll.be  Fact Sheet for Healthcare Providers: https://www.woods-mathews.com/  This test is not yet approved or cleared by the [place] FDA and  has been authorized for detection and/or diagnosis of SARS-CoV-2 by FDA under an Emergency Use Authorization (EUA). This EUA will remain   in effect (meaning this test can be used) for the duration of the COVID-19 declaration under Se ction 564(b)(1) of the Act, 21 U.S.C. section 360bbb-3(b)(1), unless the authorization is terminated or revoked sooner.  Performed at [facility], [place] [address], [place], [place] [address]          Radiology Studies: DG Chest 2 View  Result Date: 07/16/2020 CLINICAL DATA:  Left-sided chest pain and back pain EXAM: CHEST - 2 VIEW COMPARISON:  10/20/2014 FINDINGS: The heart size and mediastinal contours are within normal limits. Pulmonary hyperinflation. The visualized skeletal structures are unremarkable. IMPRESSION: Pulmonary hyperinflation without acute abnormality of the lungs. Electronically Signed   By: Eddie Candle M.D.   On: 07/16/2020 20:14   MR ABDOMEN W WO CONTRAST  Result Date: 07/18/2020 CLINICAL DATA:  Suspected liver metastasis in the setting of pancreatic neoplasm. EXAM: MRI ABDOMEN WITHOUT AND WITH CONTRAST TECHNIQUE: Multiplanar multisequence MR imaging of the abdomen was performed both before and after the administration of intravenous contrast. CONTRAST:  70mL GADAVIST GADOBUTROL 1 MMOL/ML IV SOLN COMPARISON:  CT evaluation of July 16, 2020. FINDINGS: Lower chest: Diffuse esophageal thickening seen on recent CT of the chest, abdomen and pelvis is not evaluated. There is no dense consolidation. Small bilateral pleural effusions are present. Hepatobiliary: Hepatic iron deposition makes liver assessment limited as does the presence of respiratory motion. There are multiple hepatic lesions. Lesions in terms of enhancement are not well evaluated due to the presence of background signal within the vasculature related to recent Feraheme infusion based on appearance. For this reason the pancreas is also not well evaluated on precontrast imaging. The portal vein is narrowed about the pancreas, main portal vein. The SMV appears patent. Narrowing of the portal vein is in the mid portal  vein adjacent to the large mass which is centered in the hepatogastric recess more than in the pancreatic head. Hepatic lesions, dominant lesion in the LEFT hepatic lobe measuring 3.4 x 2.8 cm without signal to suggest cyst, subtraction images with enhancement pattern compatible with metastatic lesion on subtraction. Motion degraded study. No pericholecystic stranding.  No biliary duct dilation. The lesion in  the anterior LEFT hemi liver, hepatic subsegment II (image 14 of series 19) also likely a metastatic focus. The presence of profound iron deposition in the liver again limiting assessment of these areas. Other areas of diffusion related signal in the hepatic parenchyma at least 4 additional small areas, some in the RIGHT hepatic lobe largest near the IVC confluence on image 78 of series 10 measures 14 mm. Inferior LEFT hepatic lobe lesion seen on previous CT approximately 2 cm (image 19/5) also suspicious for metastasis. Pancreas: Pancreas also showing very low intrinsic "pre contrast" T1 signal compatible with iron deposition likely related to recent Feraheme administration. Large mass adjacent to or arising from the pancreas in the gastrohepatic region tracking into the hepato gastric recess measuring as much as 5 x 5.2 cm (image 47/10). No pancreatic ductal dilation despite large mass in the region. Spleen:  Spleen normal size and contour, no focal lesion. Adrenals/Urinary Tract:  Adrenal glands are normal. Renal cysts bilaterally. Stomach/Bowel: No acute gastrointestinal process to the extent evaluated. Esophageal abnormalities not visualized on CT. Vascular/Lymphatic: Portal venous narrowing in the mid portal vein, high-grade narrowing. Signal remains within the portal vein and within the SMV. No adenopathy below the renal veins. Celiac adenopathy and RIGHT juxta crural nodal enlargement behind the inferior vena cava nearly contiguous with large mass region of the gastrohepatic ligament. Other:  Trace  ascites. Musculoskeletal: No suspicious bone lesions identified. But signs of diffuse iron deposition. IMPRESSION: Signs of malignancy with mass in the region of the hepatogastric recess near the pancreatic head that narrows vascular structures and is associated with hepatic metastatic disease. Exam limited by recent presumed administration of Feraheme which confounds assessment. 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 Pancreatic neoplasm is felt less likely given the lack of peripheral ductal dilation though assessment is quite limited. Consider upper endoscopy and endoscopic ultrasound evaluation for further assessment. Further staging should be performed with CT for at least 60-90 days given the recent iron infusion. Would defer decision for the type per protocol to be utilized until after the endoscopy. Electronically Signed   By: Zetta Bills M.D.   On: 07/18/2020 07:48   CT Angio Chest/Abd/Pel for Dissection W and/or Wo Contrast  Result Date: 07/16/2020 CLINICAL DATA:  Abdominal pain, aortic dissection suspected EXAM: CT ANGIOGRAPHY CHEST, ABDOMEN AND PELVIS TECHNIQUE: Non-contrast CT of the chest was initially obtained. Multidetector CT imaging through the chest, abdomen and pelvis was performed using the standard protocol during bolus administration of intravenous contrast. Multiplanar reconstructed images and MIPs were obtained and reviewed to evaluate the vascular anatomy. CONTRAST:  169mL OMNIPAQUE IOHEXOL 350 MG/ML SOLN COMPARISON:  CT angiography chest 10/20/2014 FINDINGS: CTA CHEST FINDINGS Cardiovascular: Satisfactory opacification of the pulmonary arteries to the segmental level. No evidence of pulmonary embolism. Normal heart size. No pericardial effusion. Mediastinum/Nodes: Limited evaluation for mediastinal lymphadenopathy due to timing of contrast. Question subcarinal lymph node. No enlarged hilar or axillary lymph nodes.  Thyroid gland and trachea demonstrate no significant findings. Marked diffuse esophageal wall thickening and haziness. Associated posterior mediastinal haziness and soft tissue density. Soft tissue density in haziness abuts the posterior trachea and carina. Lungs/Pleura: No pulmonary nodule. No pulmonary mass. Thin consolidation along the pleura within the posterior right middle lobe. No pleural effusion. No pneumothorax. Musculoskeletal: No chest wall abnormality. No suspicious lytic or blastic osseous lesions. No acute displaced fracture. Multilevel degenerative changes of the spine. Review of the MIP images confirms  the above findings. CTA ABDOMEN AND PELVIS FINDINGS VASCULAR Aorta: At least moderate atherosclerotic plaque. Normal caliber aorta without aneurysm, dissection, vasculitis or significant stenosis. Celiac: Mild atherosclerotic plaque. Patent without evidence of aneurysm, dissection, vasculitis or significant stenosis. SMA: Mild atherosclerotic plaque. Patent without evidence of aneurysm, dissection, vasculitis or significant stenosis. Renals: Both renal arteries are patent without evidence of aneurysm, dissection, vasculitis, fibromuscular dysplasia or significant stenosis. IMA: Patent without evidence of aneurysm, dissection, vasculitis or significant stenosis. Inflow: At least moderate atherosclerotic plaque. Patent without evidence of aneurysm, dissection, vasculitis or significant stenosis. Veins: No obvious venous abnormality within the limitations of this arterial phase study. Review of the MIP images confirms the above findings. NON-VASCULAR Hepatobiliary: There is a 1.5 cm hypodensity within the left hepatic lobe (7:128) as well as a poorly defined 3.1 cm hypodensity within the inferior left lateral hepatic lobe (7:168). No gallstones, gallbladder wall thickening, or pericholecystic fluid. No biliary dilatation. Pancreas: Question 1.6 cm proximal pancreatic mass with markedly limited evaluation  due to timing of contrast. Spleen: Normal in size without focal abnormality. Adrenals/Urinary Tract: No adrenal nodule bilaterally. Bilateral kidneys enhance symmetrically. There is a 1.3 cm fluid density lesion within the left kidney that likely represents a simple renal cyst. Subcentimeter hypodensities are too small to characterize. No hydronephrosis. No hydroureter. The urinary bladder is unremarkable. Stomach/Bowel: Stomach is within normal limits. No evidence of bowel wall thickening or dilatation. Lymphatic: No definite lymphadenopathy. Reproductive: Prostate is unremarkable. Other: No intraperitoneal free fluid. No intraperitoneal free gas. No organized fluid collection. Musculoskeletal: No abdominal wall hernia or abnormality. No suspicious lytic or blastic osseous lesions. No acute displaced fracture. Multilevel degenerative changes of the spine. Review of the MIP images confirms the above findings. IMPRESSION: 1. No acute vascular abnormality. 2. Marked diffuse esophageal wall thickening and haziness as well as surrounding posterior mediastinal haziness/fat stranding. Findings concerning for malignancy versus inflammation. Recommend direct visualization. 3. Mediastinal lymphadenopathy. 4. Question 1.6 cm proximal pancreatic mass with markedly limited evaluation due to timing of contrast. Concern for malignancy. Recommend MRI pancreatic protocol for further evaluation. 5. Indeterminate 1.5 cm as well as another 3.1 cm poorly defined left hepatic lobe hypodensity. These finding can be further evaluated on MRI pancreatic protocol. Electronically Signed   By: Iven Finn M.D.   On: 07/16/2020 23:42        Scheduled Meds:  alum & mag hydroxide-simeth  30 mL Oral TID   And   lidocaine  15 mL Oral TID   budesonide (PULMICORT) nebulizer solution  0.25 mg Nebulization BID   enoxaparin (LOVENOX) injection  40 mg Subcutaneous QHS   feeding supplement  237 mL Oral BID BM   ipratropium-albuterol  3 mL  Nebulization BID   metoprolol tartrate  25 mg Oral BID   pantoprazole (PROTONIX) IV  40 mg Intravenous Q12H   senna-docusate  2 tablet Oral QHS   Continuous Infusions:   LOS: 0 days    Time spent: 78mins    Kathie Dike, MD [facility]   If 7PM-7AM, please contact night-coverage [email or web address]  07/18/2020, 3:59 PM

## 2020-07-18 NOTE — Op Note (Signed)
[facility] Patient Name: Christian Newton Procedure Date : 07/18/2020 MRN: [id number] Attending MD: Mauri Pole , MD Date of Birth: 1960-11-23 CSN: [id number] Age: 60 Admit Type: Inpatient Procedure:                Upper GI endoscopy Indications:              Dysphagia, Abnormal CT of the GI tract,                            Malnutrition, Weight loss Providers:                Mauri Pole, MD, Dulcy Fanny, Benetta Spar, Technician Referring MD:              Medicines:                Monitored Anesthesia Care Complications:            No immediate complications. Estimated Blood Loss:     Estimated blood loss was minimal. Procedure:                Pre-Anesthesia Assessment:                           - Prior to the procedure, a History and Physical                            was performed, and patient medications and                            allergies were reviewed. The patient's tolerance of                            previous anesthesia was also reviewed. The risks                            and benefits of the procedure and the sedation                            options and risks were discussed with the patient.                            All questions were answered, and informed consent                            was obtained. Prior Anticoagulants: The patient has                            taken no previous anticoagulant or antiplatelet                            agents. ASA Grade Assessment: III - A patient with  severe systemic disease. After reviewing the risks                            and benefits, the patient was deemed in                            satisfactory condition to undergo the procedure.                           After obtaining informed consent, the endoscope was                            passed under direct vision. Throughout the                            procedure, the  patient's blood pressure, pulse, and                            oxygen saturations were monitored continuously. The                            [id number]-H190 ([id number]) Olympus gastroscope was                            introduced through the mouth, and advanced to the                            second part of duodenum. The upper GI endoscopy was                            accomplished without difficulty. The patient                            tolerated the procedure well. Scope In: Scope Out: Findings:      A large, ulcerating mass with friable mucosa and bleeding on contact was       found in the middle third of the esophagus, 27 to 33 cm from the       incisors. The mass was partially obstructing and circumferential.       Biopsies were taken with a cold forceps for histology.      The Z-line was regular and was found 44 cm from the incisors.      The stomach was normal.      The cardia and gastric fundus were normal on retroflexion.      The examined duodenum was normal. 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 Biopsied.                           -  Z-line regular, 44 cm from the incisors.                           - Normal stomach.                           - Normal examined duodenum. Recommendation:           - Mechanical soft diet.                           - Continue present medications.                           - Await pathology results.                           - Follow up MRI abdomen results                           - Oncology consult pending biopsy results Procedure Code(s):        --- Professional ---                           [phone], Esophagogastroduodenoscopy, flexible,                            transoral; with biopsy, single or multiple Diagnosis Code(s):        --- Professional ---                           D49.0, Neoplasm  of unspecified behavior of                            digestive system                           R13.10, Dysphagia, unspecified                           E46, Unspecified protein-calorie malnutrition                           R63.4, Abnormal weight loss                           R93.3, Abnormal findings on diagnostic imaging of                            other parts of digestive tract CPT copyright 2019 [employer]. All rights reserved. The codes documented in this report are preliminary and upon coder review may  be revised to meet current compliance requirements. Mauri Pole, MD 07/18/2020 8:14:04 AM This report has been signed electronically. Number of Addenda: 0

## 2020-07-18 NOTE — Transfer of Care (Addendum)
Immediate Anesthesia Transfer of Care Note  Patient: Christian Newton  Procedure(s) Performed: ESOPHAGOGASTRODUODENOSCOPY (EGD) WITH PROPOFOL BIOPSY  Patient Location: PACU  Anesthesia Type:General  Level of Consciousness: drowsy  Airway & Oxygen Therapy: Patient Spontanous Breathing and Patient connected to nasal cannula oxygen  Post-op Assessment: Report given to RN, Post -op Vital signs reviewed and stable and Post -op Vital signs reviewed and unstable, Anesthesiologist notified  Post vital signs: stable  Last Vitals:  Vitals Value Taken Time  BP 137/89 07/18/20 0809  Temp    Pulse 92 07/18/20 0811  Resp 18 07/18/20 0811  SpO2 100 % 07/18/20 0811  Vitals shown include unvalidated device data.  Last Pain:  Vitals:   07/18/20 0703  TempSrc: Oral  PainSc: 7       Patients Stated Pain Goal: 0 (36/12/24 4975)  Complications: No notable events documented.

## 2020-07-18 NOTE — Progress Notes (Signed)
Pt complaining of 8/10 pain in lower back. I have tried morphine and oxy with not relief. Blood pressures are elevated. BP current at 170/102 with heart rate of 89. Dr. Roderic Palau paged and pain medication will be adjusted.

## 2020-07-18 NOTE — Progress Notes (Signed)
Off unit at shift change.

## 2020-07-18 NOTE — Interval H&P Note (Signed)
History and Physical Interval Note:  07/18/2020 7:06 AM  Christian Newton  has presented today for surgery, with the diagnosis of dysphagia, wt loss, Chest pain, abnormal CT.  The various methods of treatment have been discussed with the patient and family. After consideration of risks, benefits and other options for treatment, the patient has consented to  Procedure(s): ESOPHAGOGASTRODUODENOSCOPY (EGD) WITH PROPOFOL (N/A) as a surgical intervention.  The patient's history has been reviewed, patient examined, no change in status, stable for surgery.  I have reviewed the patient's chart and labs.  Questions were answered to the patient's satisfaction.     [name]

## 2020-07-19 ENCOUNTER — Encounter (HOSPITAL_COMMUNITY): Payer: Self-pay | Admitting: Internal Medicine

## 2020-07-19 DIAGNOSIS — E539 Vitamin B deficiency, unspecified: Secondary | ICD-10-CM

## 2020-07-19 DIAGNOSIS — D509 Iron deficiency anemia, unspecified: Secondary | ICD-10-CM

## 2020-07-19 DIAGNOSIS — E46 Unspecified protein-calorie malnutrition: Secondary | ICD-10-CM

## 2020-07-19 DIAGNOSIS — G893 Neoplasm related pain (acute) (chronic): Secondary | ICD-10-CM

## 2020-07-19 DIAGNOSIS — C155 Malignant neoplasm of lower third of esophagus: Principal | ICD-10-CM

## 2020-07-19 DIAGNOSIS — E43 Unspecified severe protein-calorie malnutrition: Secondary | ICD-10-CM | POA: Insufficient documentation

## 2020-07-19 DIAGNOSIS — R1310 Dysphagia, unspecified: Secondary | ICD-10-CM

## 2020-07-19 DIAGNOSIS — K2289 Other specified disease of esophagus: Secondary | ICD-10-CM

## 2020-07-19 LAB — SURGICAL PATHOLOGY

## 2020-07-19 MED ORDER — ADULT MULTIVITAMIN W/MINERALS CH
1.0000 | ORAL_TABLET | Freq: Every day | ORAL | Status: DC
  Administered 2020-07-19 – 2020-07-20 (×2): 1 via ORAL
  Filled 2020-07-19 (×2): qty 1

## 2020-07-19 MED ORDER — BOOST PLUS PO LIQD
237.0000 mL | Freq: Three times a day (TID) | ORAL | Status: DC
  Administered 2020-07-19 – 2020-07-20 (×3): 237 mL via ORAL
  Filled 2020-07-19 (×4): qty 237

## 2020-07-19 NOTE — Evaluation (Signed)
Physical Therapy Evaluation & Discharge Patient Details Name: Christian Newton MRN: [id number] DOB: [birth year] Today's Date: 07/19/2020   History of Present Illness  Pt is a 60 y.o. male admitted 07/16/20 with chest pain, SOB, unintentional weight loss. CT showed esophageal thickening. S/p EGD 6/27 that showed esophageal mass; possible pancreatic mass and hepatic metastases; biopsies pending. PMH includes COPD, tobacco use.   Clinical Impression  Patient evaluated by Physical Therapy with no further acute PT needs identified. PTA, pt independent, working, lives with roommates who provide transportation; pt reports no difficulties with mobility or ADLs. Today, pt independent with activity, including ADLs, ambulation and stair training. Encouraged more frequent OOB mobility while admitted (RN aware). All education has been completed and the patient has no further questions. Acute PT is signing off. Thank you for this referral.  HR 114, SpO2 94% on RA with activity    Follow Up Recommendations No PT follow up    Equipment Recommendations  None recommended by PT    Recommendations for Other Services       Precautions / Restrictions Restrictions Weight Bearing Restrictions: No      Mobility  Bed Mobility Overal bed mobility: Modified Independent                  Transfers Overall transfer level: Independent Equipment used: None                Ambulation/Gait Ambulation/Gait assistance: Independent Gait Distance (Feet): 340 Feet Assistive device: None Gait Pattern/deviations: Step-through pattern;Decreased stride length;Trunk flexed Gait velocity: Decreased   General Gait Details: Slow, steady gait, independent without DME; pt with mild gait abnormalities noted, pt reports baseline  Stairs Stairs: Yes Stairs assistance: Modified independent (Device/Increase time) Stair Management: Two rails;One rail Right;Step to pattern;Alternating pattern Number of Stairs:  6 General stair comments: Descend 6 steps with bilateral rail support, step-to pattern; ascend with single rail support, alternating pattern; mod indep with rail support  Wheelchair Mobility    Modified Rankin (Stroke Patients Only)       Balance Overall balance assessment: Mild deficits observed, not formally tested                                           Pertinent Vitals/Pain Pain Assessment: No/denies pain    Home Living Family/patient expects to be discharged to:: Group home Living Arrangements: Non-relatives/Friends Available Help at Discharge: Friend(s);Available PRN/intermittently Type of Home: House Home Access: Stairs to enter Entrance Stairs-Rails: Psychiatric nurse of Steps: 4 Home Layout: One level Home Equipment: None      Prior Function Level of Independence: Independent         Comments: Independent without DME, denies falls, 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 Does not drive; reports he has transportation from roommates or coworkers. States no issues getting to work/appts/groceries     Hand Dominance        Extremity/Trunk Assessment   Upper Extremity Assessment Upper Extremity Assessment: Overall WFL for tasks assessed    Lower Extremity Assessment Lower Extremity Assessment: Generalized weakness       Communication   Communication:  (soft spoken)  Cognition Arousal/Alertness: Awake/alert Behavior During Therapy: WFL for tasks assessed/performed Overall Cognitive Status: Within Functional Limits for tasks assessed  General Comments: WFL for simple tasks, not formally assessed; suspect near baseline cognition      General Comments      Exercises     Assessment/Plan    PT Assessment Patent does not need any further PT services  PT Problem List         PT Treatment Interventions      PT Goals (Current goals can be found in the Care  Plan section)  Acute Rehab PT Goals PT Goal Formulation: All assessment and education complete, DC therapy    Frequency     Barriers to discharge        Co-evaluation               AM-PAC PT "6 Clicks" Mobility  Outcome Measure Help needed turning from your back to your side while in a flat bed without using bedrails?: None Help needed moving from lying on your back to sitting on the side of a flat bed without using bedrails?: None Help needed moving to and from a bed to a chair (including a wheelchair)?: None Help needed standing up from a chair using your arms (e.g., wheelchair or bedside chair)?: None Help needed to walk in hospital room?: None Help needed climbing 3-5 steps with a railing? : None 6 Click Score: 24    End of Session Equipment Utilized During Treatment: Gait belt Activity Tolerance: Patient tolerated treatment well Patient left: in bed;with call bell/phone within reach Nurse Communication: Mobility status PT Visit Diagnosis: Other abnormalities of gait and mobility (R26.89)    Time: 6381-7711 PT Time Calculation (min) (ACUTE ONLY): 20 min   Charges:   PT Evaluation $PT Eval Low Complexity: [place], PT, DPT Acute Rehabilitation Services  Pager [phone] [facility] [place] 07/19/2020, 11:42 AM

## 2020-07-19 NOTE — Progress Notes (Signed)
[facility] ROUNDING NOTE   Subjective: Eager to go home. He is tolerating liquids and soft food, he continues to have discomfort in chest and upper abdomen   Objective: Vital signs in last 24 hours: Temp:  [98.1 F (36.7 C)-99.8 F (37.7 C)] 98.1 F (36.7 C) (06/28 0500) Pulse Rate:  [75-100] 100 (06/28 0832) Resp:  [18] 18 (06/28 0500) BP: (124-150)/(84-93) 124/85 (06/28 0832) SpO2:  [90 %-97 %] 95 % (06/28 0900) Last BM Date: 07/16/20 General: NAD   Intake/Output from previous day: 06/27 0701 - 06/28 0700 In: 510 [P.O.:360; I.V.:150] Out: 1150 [Urine:1150] Intake/Output this shift: Total I/O In: 360 [P.O.:360] Out: 300 [Urine:300]   Lab Results: Recent Labs    07/16/20 1855 07/17/20 0437 07/18/20 0305  WBC 8.6 9.0 8.5  HGB 11.7* 11.3* 12.6*  PLT 163 163 165  MCV 79.4* 78.9* 78.8*   BMET Recent Labs    07/16/20 1855 07/17/20 0437 07/18/20 0305  NA 136 137 137  K 3.8 4.0 4.6  CL 101 101 99  CO2 27 24 27   GLUCOSE 109* 99 111*  BUN 13 11 14   CREATININE 1.22 1.03 1.14  CALCIUM 9.1 9.0 9.4   LFT Recent Labs    07/16/20 1855 07/17/20 0437  PROT 7.0 6.5  ALBUMIN 3.6 3.3*  AST <5* 10*  ALT 9 8  ALKPHOS 53 50  BILITOT 1.1 1.0  BILIDIR  --  0.2  IBILI  --  0.8   PT/INR No results for input(s): INR in the last 72 hours.    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 EXAM: MRI ABDOMEN WITHOUT AND WITH CONTRAST TECHNIQUE: Multiplanar multisequence MR imaging of the abdomen was performed both before and after the administration of intravenous contrast. CONTRAST:  48mL GADAVIST GADOBUTROL 1 MMOL/ML IV SOLN COMPARISON:  CT evaluation of July 16, 2020. FINDINGS: Lower chest: Diffuse esophageal thickening seen on recent CT of the chest, abdomen and pelvis is not evaluated. There is no dense consolidation. Small bilateral pleural  effusions are present. Hepatobiliary: Hepatic iron deposition makes liver assessment limited as does the presence of respiratory motion. There are multiple hepatic lesions. Lesions in terms of enhancement are not well evaluated due to the presence of background signal within the vasculature related to recent Feraheme infusion based on appearance. For this reason the pancreas is also not well evaluated on precontrast imaging. The portal vein is narrowed about the pancreas, main portal vein. The SMV appears patent. Narrowing of the portal vein is in the mid portal vein adjacent to the large mass which is centered in the hepatogastric recess more than in the pancreatic head. Hepatic lesions, dominant lesion in the LEFT hepatic lobe measuring 3.4 x 2.8 cm without signal to suggest cyst, subtraction images with enhancement pattern compatible with metastatic lesion on subtraction. Motion degraded study. No pericholecystic stranding.  No biliary duct dilation. The lesion in the anterior LEFT hemi liver, hepatic subsegment II (image 14 of series 19) also likely a metastatic focus. The presence of profound iron deposition in the liver again limiting assessment of these areas. Other areas of diffusion related signal in the hepatic parenchyma at least 4 additional small areas, some in the RIGHT hepatic lobe largest near the IVC confluence on image 78 of series 10 measures 14 mm. Inferior LEFT hepatic lobe lesion seen on previous CT approximately 2 cm (image 19/5) also suspicious for metastasis. Pancreas:  Pancreas also showing very low intrinsic "pre contrast" T1 signal compatible with iron deposition likely related to recent Feraheme administration. Large mass adjacent to or arising from the pancreas in the gastrohepatic region tracking into the hepato gastric recess measuring as much as 5 x 5.2 cm (image 47/10). No pancreatic ductal dilation despite large mass in the region. Spleen:  Spleen normal size and contour, no focal  lesion. Adrenals/Urinary Tract:  Adrenal glands are normal. Renal cysts bilaterally. Stomach/Bowel: No acute gastrointestinal process to the extent evaluated. Esophageal abnormalities not visualized on CT. Vascular/Lymphatic: Portal venous narrowing in the mid portal vein, high-grade narrowing. Signal remains within the portal vein and within the SMV. No adenopathy below the renal veins. Celiac adenopathy and RIGHT juxta crural nodal enlargement behind the inferior vena cava nearly contiguous with large mass region of the gastrohepatic ligament. Other:  Trace ascites. Musculoskeletal: No suspicious bone lesions identified. But signs of diffuse iron deposition. IMPRESSION: Signs of malignancy with mass in the region of the hepatogastric recess near the pancreatic head that narrows vascular structures and is associated with hepatic metastatic disease. Exam limited by recent presumed administration of Feraheme which confounds assessment. 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 Pancreatic neoplasm is felt less likely given the lack of peripheral ductal dilation though assessment is quite limited. Consider upper endoscopy and endoscopic ultrasound evaluation for further assessment. Further staging should be performed with CT for at least 60-90 days given the recent iron infusion. Would defer decision for the type per protocol to be utilized until after the endoscopy. Electronically Signed   By: Zetta Bills M.D.   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 LFT within normal range. He does not need EUS or ERCP.  He will need oncology and radiation oncology  consult to discuss treatment plan Follow-up in GI as needed  Discussed results with patient. The patient was provided an opportunity to ask questions and all were answered. The patient agreed with the plan and demonstrated an understanding of the instructions.   He is eager to go home, okay to discharge from GI standpoint. Please call with any questions.  This visit required 25 minutes of patient care (this includes precharting, chart review, review of results, face-to-face time used for counseling as well as treatment plan and follow-up.   Damaris Hippo , MD [phone]  [facility]

## 2020-07-19 NOTE — Evaluation (Signed)
Occupational Therapy Evaluation Patient Details Name: Christian Newton MRN: [id number] DOB: 07-02-1960 Today's Date: 07/19/2020    History of Present Illness Pt is a 60 y.o. male admitted 07/16/20 with chest pain, SOB, unintentional weight loss. CT showed esophageal thickening. S/p EGD 6/27 that showed esophageal mass; possible pancreatic mass and hepatic metastases; biopsies pending. PMH includes COPD, tobacco use.   Clinical Impression    Pt. Was able to safely demonstrated ADLs and ADL transfers at I to Mod I level. Pt. States he feels like he is able to dc home and be able to take care of himself. Pt. Did not have any lob during session. OT to sign off.   Follow Up Recommendations  No OT follow up    Equipment Recommendations  None recommended by OT    Recommendations for Other Services       Precautions / Restrictions Restrictions Weight Bearing Restrictions: No      Mobility Bed Mobility Overal bed mobility: Modified Independent                  Transfers Overall transfer level: Independent Equipment used: None                  Balance Overall balance assessment: Mild deficits observed, not formally tested                                         ADL either performed or assessed with clinical judgement   ADL Overall ADL's : At baseline                                             Vision Baseline Vision/History: Wears glasses Wears Glasses: Reading only Vision Assessment?: No apparent visual deficits     Perception     Praxis      Pertinent Vitals/Pain Pain Assessment: 0-10 Pain Score: 5  Pain Location: back Pain Descriptors / Indicators: Aching Pain Intervention(s): Premedicated before session     Hand Dominance Right   Extremity/Trunk Assessment Upper Extremity Assessment Upper Extremity Assessment: Overall WFL for tasks assessed   Lower Extremity Assessment Lower Extremity Assessment:  Generalized weakness       Communication Communication Communication: No difficulties   Cognition Arousal/Alertness: Awake/alert Behavior During Therapy: WFL for tasks assessed/performed Overall Cognitive Status: Within Functional Limits for tasks assessed                                 General Comments: WFL for simple tasks, not formally assessed; suspect near baseline cognition   General Comments       Exercises     Shoulder Instructions      Home Living Family/patient expects to be discharged to:: Group home Living Arrangements: Non-relatives/Friends Available Help at Discharge: Friend(s);Available PRN/intermittently Type of Home: House Home Access: Stairs to enter [employer] of Steps: 4 Entrance Stairs-Rails: Right;Left Home Layout: One level     Bathroom Shower/Tub: Teacher, early years/pre: Standard     Home Equipment: None          Prior Functioning/Environment Level of Independence: Independent        Comments: Independent without DME, denies falls, 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  Does not drive; reports he has transportation from roommates or coworkers. States no issues getting to work/appts/groceries        OT Problem List:        OT Treatment/Interventions:      OT Goals(Current goals can be found in the care plan section) Acute Rehab OT Goals Patient Stated Goal: go home OT Goal Formulation: With patient  OT Frequency:     Barriers to D/C:            Co-evaluation              AM-PAC OT "6 Clicks" Daily Activity     Outcome Measure Help from another person eating meals?: None Help from another person taking care of personal grooming?: None Help from another person toileting, which includes using toliet, bedpan, or urinal?: None Help from another person bathing (including washing, rinsing, drying)?: None Help from another person to put on and taking off regular upper body  clothing?: None Help from another person to put on and taking off regular lower body clothing?: None 6 Click Score: 24   End of Session Nurse Communication:  (ok therapy)  Activity Tolerance: Patient tolerated treatment well Patient left: in chair;with call bell/phone within reach                   Time: 1208-1229 OT Time Calculation (min): 21 min Charges:  OT General Charges $OT Visit: 1 Visit OT Evaluation $OT Eval Low Complexity: 1 Low  Reece Packer OT/L'   Ula Couvillon 07/19/2020, 12:45 PM

## 2020-07-19 NOTE — Progress Notes (Signed)
PROGRESS NOTE    Christian Newton  ESP:[id number] DOB: July 20, 1960 DOA: 07/16/2020 PCP: Pcp, No    Brief Narrative:  60 year old male with a history of tobacco use, admitted to the hospital with complaints of chest discomfort and shortness of breath.  Described persistent chest pain radiating to his back for the past 2 weeks.  He is also had unintentional weight loss.  CT scan indicated esophageal thickening.  Seen by GI and underwent EGD that indicated esophageal mass.  Further imaging also indicated possible hepatic mass/lesions in the hepatogastric recess.  Biopsies confirmed squamous cell cancer.  Oncology and palliative care have been consulted.   Assessment & Plan:   Active Problems:   Smoker   Chest pain   Pancreatic mass   Acute respiratory failure with hypoxia (HCC)   Unintentional weight loss   B12 deficiency   Iron deficiency   Abnormal CT scan, esophagus   Mass of esophagus   Mass of esophagus determined by endoscopy   Non-cardiac chest pain   Esophageal mass   Protein-calorie malnutrition, severe   Chest pain. -Ruled out for ACS with negative cardiac markers, also symptoms are atypical for cardiac chest pain since they have been present consistently for 2 weeks -D-dimer is also negative -Suspect his symptoms are more GI related -Continue pain management   Squamous cell carcinoma of esophagus, new diagnosis -Seen by GI and underwent endoscopy -Biopsies confirm squamous cell cancer -I have informed patient of his biopsy results -Discussed with oncology, Dr. Alen Blew who will see the patient in consultation -Imaging did not indicate possible metastatic lesions in the liver and hepatogastric recess near the pancreatic head -No evidence of pancreatic or biliary duct dilatation or obstruction -Discussed with GI and EUS/ERCP was not indicated at this time  Anemia -Multifactorial -Patient noted to have iron deficiency as well as B12 deficiency -Received B12  replacement -1 dose of Feraheme was ordered, but this was discontinued within 5 minutes since he began to develop itching -Overall hemoglobin has been stable   Acute respiratory failure with hypoxia -ED notes indicate that his oxygen saturations dropped to 88% on room air on ambulation -He is currently stable on 2 L of oxygen -D-dimer negative, BNP normal, no evidence of pneumonia on CT scan. -With his long history of tobacco use, I suspect he may have an underlying element of COPD -He will need follow-up with pulmonology for PFTs -Started on bronchodilators and inhaled steroids with some subjective improvement  Dysphagia -Reports that he starts coughing immediately after and drink liquids -Esophageal process may be contributing -Seen by speech therapy, will need MBSS   Elevated blood pressure -Started on metoprolol, blood pressures have been stable  Back pain -Suspect this may be related to his underlying GI process -Continue pain management -Seen by physical therapy/Occupational Therapy with no follow-up recommended  Severe protein calorie malnutrition -Patient has had significant weight loss related to his malignancy -Nutrition following  Goals of care -With patient's metastatic malignancy, malnutrition his long-term prognosis appears to be poor -He will be seen by oncology to discuss treatment options if desired -We will consult palliative care to further address goals of care including CODE STATUS since he is still a full code   DVT prophylaxis: enoxaparin (LOVENOX) injection 40 mg Start: 07/17/20 0115 SCDs Start: 07/17/20 0102  Code Status: Full code Family Communication: Tried to call brother, but unable to reach him over the phone Disposition Plan: Status is: Inpatient  Remains inpatient appropriate because:Ongoing active pain requiring inpatient pain  management and Ongoing diagnostic testing needed not appropriate for outpatient work up  Dispo:  Patient From:  Home  Planned Disposition: Home  Medically stable for discharge: No           Consultants:  Gastroenterology Oncology Palliative care  Procedures:  EGD: - Partially obstructing, rule out malignancy,                           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 Biopsied.                           - Z-line regular, 44 cm from the incisors.                           - Normal stomach.                           - Normal examined duodenum  Antimicrobials:      Subjective: Continues to report coughing after drinking fluids.  Overall feels that his back pain is doing better than yesterday.  Feels that breathing may be improving.  Objective: Vitals:   07/19/20 0500 07/19/20 0832 07/19/20 0858 07/19/20 0900  BP: 127/90 124/85    Pulse: 88 100    Resp: 18     Temp: 98.1 F (36.7 C)     TempSrc: Oral     SpO2: 94%  95% 95%  Weight:      Height:        Intake/Output Summary (Last 24 hours) at 07/19/2020 1452 Last data filed at 07/19/2020 1353 Gross per 24 hour  Intake 720 ml  Output 1450 ml  Net -730 ml   Filed Weights   07/17/20 0313 07/18/20 0703  Weight: 57.3 kg 57.2 kg    Examination:  General exam: Alert, awake, oriented x 3 Respiratory system: scattered rhonchi, no wheezing. Respiratory effort normal. Cardiovascular system:RRR. No murmurs, rubs, gallops. Gastrointestinal system: Abdomen is nondistended, soft and nontender. No organomegaly or masses felt. Normal bowel sounds heard. Central nervous system: Alert and oriented. No focal neurological deficits. Extremities: No C/C/E, +pedal pulses Skin: No rashes, lesions or ulcers Psychiatry: Judgement and insight appear normal. Mood & affect appropriate.      Data Reviewed: I have personally reviewed following labs and imaging studies  CBC: Recent Labs  Lab 07/16/20 1855 07/17/20 0437 07/18/20 0305  WBC 8.6 9.0 8.5  NEUTROABS 7.0  --   --   HGB  11.7* 11.3* 12.6*  HCT 35.0* 34.0* 38.2*  MCV 79.4* 78.9* 78.8*  PLT 163 163 841   Basic Metabolic Panel: Recent Labs  Lab 07/16/20 1855 07/17/20 0437 07/18/20 0305  NA 136 137 137  K 3.8 4.0 4.6  CL 101 101 99  CO2 27 24 27   GLUCOSE 109* 99 111*  BUN 13 11 14   CREATININE 1.22 1.03 1.14  CALCIUM 9.1 9.0 9.4  MG  --  1.8  --   PHOS  --  3.7  --    GFR: Estimated Creatinine Clearance: 54.4 mL/min (by C-G formula based on SCr of 1.14 mg/dL). Liver Function Tests: Recent Labs  Lab 07/16/20 1855 07/17/20 0437  AST <  5* 10*  ALT 9 8  ALKPHOS 53 50  BILITOT 1.1 1.0  PROT 7.0 6.5  ALBUMIN 3.6 3.3*   Recent Labs  Lab 07/17/20 0437  LIPASE 21   No results for input(s): AMMONIA in the last 168 hours. Coagulation Profile: No results for input(s): INR, PROTIME in the last 168 hours. Cardiac Enzymes: No results for input(s): CKTOTAL, CKMB, CKMBINDEX, TROPONINI in the last 168 hours. BNP (last 3 results) No results for input(s): PROBNP in the last 8760 hours. HbA1C: No results for input(s): HGBA1C in the last 72 hours. CBG: No results for input(s): GLUCAP in the last 168 hours. Lipid Profile: No results for input(s): CHOL, HDL, LDLCALC, TRIG, CHOLHDL, LDLDIRECT in the last 72 hours. Thyroid Function Tests: No results for input(s): TSH, T4TOTAL, FREET4, T3FREE, THYROIDAB in the last 72 hours. Anemia Panel: Recent Labs    07/17/20 0437  VITAMINB12 120*  FOLATE 8.7  FERRITIN 233  TIBC 210*  IRON 11*   Sepsis Labs: No results for input(s): PROCALCITON, LATICACIDVEN in the last 168 hours.  Recent Results (from the past 240 hour(s))  SARS CORONAVIRUS 2 (TAT 6-24 HRS) Nasopharyngeal Nasopharyngeal Swab     Status: None   Collection Time: 07/17/20  1:13 AM   Specimen: Nasopharyngeal Swab  Result Value Ref Range Status   SARS Coronavirus 2 NEGATIVE NEGATIVE Final    Comment: (NOTE) SARS-CoV-2 target nucleic acids are NOT DETECTED.  The SARS-CoV-2 RNA is generally  detectable in upper and lower respiratory specimens during the acute phase of infection. Negative results do not preclude SARS-CoV-2 infection, do not rule out co-infections with other pathogens, and should not be used as the sole basis for treatment or other patient management decisions. Negative results must be combined with clinical observations, patient history, and epidemiological information. The expected result is Negative.  Fact Sheet for Patients: SugarRoll.be  Fact Sheet for Healthcare Providers: https://www.woods-mathews.com/  This test is not yet approved or cleared by the [place] FDA and  has been authorized for detection and/or diagnosis of SARS-CoV-2 by FDA under an Emergency Use Authorization (EUA). This EUA will remain  in effect (meaning this test can be used) for the duration of the COVID-19 declaration under Se ction 564(b)(1) of the Act, 21 U.S.C. section 360bbb-3(b)(1), unless the authorization is terminated or revoked sooner.  Performed at [facility], [place] [address], [place], [place] [address]          Radiology Studies: MR ABDOMEN W WO CONTRAST  Result Date: 07/18/2020 CLINICAL DATA:  Suspected liver metastasis in the setting of pancreatic neoplasm. EXAM: MRI ABDOMEN WITHOUT AND WITH CONTRAST TECHNIQUE: Multiplanar multisequence MR imaging of the abdomen was performed both before and after the administration of intravenous contrast. CONTRAST:  61mL GADAVIST GADOBUTROL 1 MMOL/ML IV SOLN COMPARISON:  CT evaluation of July 16, 2020. FINDINGS: Lower chest: Diffuse esophageal thickening seen on recent CT of the chest, abdomen and pelvis is not evaluated. There is no dense consolidation. Small bilateral pleural effusions are present. Hepatobiliary: Hepatic iron deposition makes liver assessment limited as does the presence of respiratory motion. There are multiple hepatic lesions. Lesions in terms of enhancement  are not well evaluated due to the presence of background signal within the vasculature related to recent Feraheme infusion based on appearance. For this reason the pancreas is also not well evaluated on precontrast imaging. The portal vein is narrowed about the pancreas, main portal vein. The SMV appears patent. Narrowing of the portal vein is  in the mid portal vein adjacent to the large mass which is centered in the hepatogastric recess more than in the pancreatic head. Hepatic lesions, dominant lesion in the LEFT hepatic lobe measuring 3.4 x 2.8 cm without signal to suggest cyst, subtraction images with enhancement pattern compatible with metastatic lesion on subtraction. Motion degraded study. No pericholecystic stranding.  No biliary duct dilation. The lesion in the anterior LEFT hemi liver, hepatic subsegment II (image 14 of series 19) also likely a metastatic focus. The presence of profound iron deposition in the liver again limiting assessment of these areas. Other areas of diffusion related signal in the hepatic parenchyma at least 4 additional small areas, some in the RIGHT hepatic lobe largest near the IVC confluence on image 78 of series 10 measures 14 mm. Inferior LEFT hepatic lobe lesion seen on previous CT approximately 2 cm (image 19/5) also suspicious for metastasis. Pancreas: Pancreas also showing very low intrinsic "pre contrast" T1 signal compatible with iron deposition likely related to recent Feraheme administration. Large mass adjacent to or arising from the pancreas in the gastrohepatic region tracking into the hepato gastric recess measuring as much as 5 x 5.2 cm (image 47/10). No pancreatic ductal dilation despite large mass in the region. Spleen:  Spleen normal size and contour, no focal lesion. Adrenals/Urinary Tract:  Adrenal glands are normal. Renal cysts bilaterally. Stomach/Bowel: No acute gastrointestinal process to the extent evaluated. Esophageal abnormalities not visualized on CT.  Vascular/Lymphatic: Portal venous narrowing in the mid portal vein, high-grade narrowing. Signal remains within the portal vein and within the SMV. No adenopathy below the renal veins. Celiac adenopathy and RIGHT juxta crural nodal enlargement behind the inferior vena cava nearly contiguous with large mass region of the gastrohepatic ligament. Other:  Trace ascites. Musculoskeletal: No suspicious bone lesions identified. But signs of diffuse iron deposition. IMPRESSION: Signs of malignancy with mass in the region of the hepatogastric recess near the pancreatic head that narrows vascular structures and is associated with hepatic metastatic disease. Exam limited by recent presumed administration of Feraheme which confounds assessment. 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 Pancreatic neoplasm is felt less likely given the lack of peripheral ductal dilation though assessment is quite limited. Consider upper endoscopy and endoscopic ultrasound evaluation for further assessment. Further staging should be performed with CT for at least 60-90 days given the recent iron infusion. Would defer decision for the type per protocol to be utilized until after the endoscopy. Electronically Signed   By: Zetta Bills M.D.   On: 07/18/2020 07:48        Scheduled Meds:  budesonide (PULMICORT) nebulizer solution  0.25 mg Nebulization BID   enoxaparin (LOVENOX) injection  40 mg Subcutaneous QHS   ipratropium-albuterol  3 mL Nebulization BID   lactose free nutrition  237 mL Oral TID WC   metoprolol tartrate  25 mg Oral BID   multivitamin with minerals  1 tablet Oral Daily   pantoprazole (PROTONIX) IV  40 mg Intravenous Q12H   senna-docusate  2 tablet Oral QHS   Continuous Infusions:   LOS: 1 day    Time spent: 40mins    Kathie Dike, MD [facility]   If 7PM-7AM, please contact night-coverage [email or web address]  07/19/2020, 2:52 PM

## 2020-07-19 NOTE — Evaluation (Signed)
Clinical/Bedside Swallow Evaluation Patient Details  Name: Christian Newton MRN: [id number] Date of Birth: [birth year]  Today's Date: 07/19/2020 Time: SLP Start Time (ACUTE ONLY): 1051 SLP Stop Time (ACUTE ONLY): 1115 SLP Time Calculation (min) (ACUTE ONLY): 24 min  Past Medical History:  Past Medical History:  Diagnosis Date   Cat bite of left hand 05/11/2015   COPD (chronic obstructive pulmonary disease) ([place])    Smoker    Past Surgical History:  Past Surgical History:  Procedure Laterality Date   APPENDECTOMY     I & D EXTREMITY Left 05/13/2015   Procedure: INCISION AND DRAINAGE OF LEFT EXTREMITY;  Surgeon: Charlotte Crumb, MD;  Location: [place];  Service: Orthopedics;  Laterality: Left;   INCISION AND DRAINAGE Left 05/13/2015   HPI:  60 year old male with a history of tobacco use, admitted to the hospital with complaints of chest discomfort and shortness of breath.  Described persistent chest pain radiating to his back for the past 2 weeks.  He is also had unintentional weight loss, odynophagia, coughing with all POs  CT scan indicated esophageal thickening.  Seen by GI and underwent EGD that indicated esophageal mass.  Further imaging also indicates possible pancreatic mass and hepatic metastases.  Currently biopsies are pending.   Assessment / Plan / Recommendation Clinical Impression  Pt presents for concern for pharyngoesophageal dysphagia. He notes 3-6 month hx of coughing with POs, odynophagia, unintentional weight loss (per pt ~45lbs), and voicing changes (hoarse with reduced vocal intensity). Despite coughing with POs, pt without pulmonary infection this admission and denys hx of PNA. Pt with overt weaker cough with all POs trialed this date: ice chips, thin liquids, nectar thick, and puree concenring for reduced airway protection. Recommend proceed with instrumental assessment (MBSS) to objectively assess aspiration risk. Radiology unable to accomodate MBSS completion this date  due no availability, will completion ASAP. Pt pending biopsy for novel esophageal mass (per chart review mass partially obstructing esophagus; followed by GI). Recommend continue baseline soft and thin liquid diet with meds in puree for comfort, nutrition/hydration until completion of MBSS. Pt & RN in agreement with plan. Pt will likely benefit from pallliative consult once all workup has been completed to assist with goals of care.  SLP Visit Diagnosis: Dysphagia, unspecified (R13.10)    Aspiration Risk  Moderate aspiration risk;Risk for inadequate nutrition/hydration    Diet Recommendation Dysphagia 3 (mechanical soft) thin liquids     Medication Administration: Whole meds with puree    Other  Recommendations Oral Care Recommendations: Oral care BID   Follow up Recommendations Other (comment) (TBD)      Frequency and Duration min 2x/week  2 weeks       Prognosis Prognosis for Safe Diet Advancement: Fair Barriers to Reach Goals: Severity of deficits;Other (Comment) (pending full workup for new medical diagnoses)      Swallow Study   General Date of Onset: 07/18/20 HPI: 60 year old male with a history of tobacco use, admitted to the hospital with complaints of chest discomfort and shortness of breath.  Described persistent chest pain radiating to his back for the past 2 weeks.  He is also had unintentional weight loss, odynophagia, coughing with all POs  CT scan indicated esophageal thickening.  Seen by GI and underwent EGD that indicated esophageal mass.  Further imaging also indicates possible pancreatic mass and hepatic metastases.  Currently biopsies are pending. Type of Study: Bedside Swallow Evaluation Previous Swallow Assessment: none on file Diet Prior to this Study: Dysphagia 3 (soft);Thin liquids  Temperature Spikes Noted: No Respiratory Status: Room air History of Recent Intubation: No Behavior/Cognition: Alert;Cooperative;Pleasant mood Oral Cavity Assessment: Within  Functional Limits Oral Care Completed by SLP: No Oral Cavity - Dentition: Edentulous Vision: Functional for self-feeding Self-Feeding Abilities: Able to feed self Patient Positioning: Upright in chair Baseline Vocal Quality: Low vocal intensity;Hoarse (states voicing changes for past 6 months) Volitional Cough: Weak;Congested Volitional Swallow: Able to elicit    Oral/Motor/Sensory Function Overall Oral Motor/Sensory Function: Within functional limits   Ice Chips Ice chips: Impaired Presentation: Spoon Pharyngeal Phase Impairments: Suspected delayed Swallow;Multiple swallows;Cough - Delayed   Thin Liquid Thin Liquid: Impaired Presentation: Cup Pharyngeal  Phase Impairments: Suspected delayed Swallow;Multiple swallows;Cough - Immediate;Cough - Delayed    Nectar Thick Nectar Thick Liquid: Impaired Presentation: Cup Pharyngeal Phase Impairments: Suspected delayed Swallow;Cough - Delayed;Multiple swallows   Honey Thick Honey Thick Liquid: Not tested   Puree Puree: Impaired Presentation: Self Fed Pharyngeal Phase Impairments: Multiple swallows;Cough - Delayed   Solid     Solid: Not tested     Hayden Rasmussen MA, CCC-SLP Acute Rehabilitation Services   07/19/2020,11:35 AM

## 2020-07-19 NOTE — Consult Note (Addendum)
[facility]  [phone] Fax:([phone]   MEDICAL ONCOLOGY - INITIAL CONSULTATION  Referral MD: Dr. 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 Volner is a 60 year old male with a past medical history significant for ongoing tobacco use disorder.  He presented to the emergency department with chest pain which started 2 weeks prior to admission.  Pain was worsening and was associated with exertional dyspnea, upper abdominal pain, and back pain.  He reported unintentional weight loss.  CTA chest/abdomen/pelvis for dissection was obtained on admission which showed no acute vascular abnormality, marked diffuse esophageal wall thickening and haziness as well as surrounding posterior mediastinal haziness/fat stranding and findings are concerning for malignancy versus inflammation, mediastinal lymphadenopathy,?  1.6 cm proximal pancreatic mass with markedly limited evaluation due to timing of contrast and concerning for malignancy, indeterminate 1.5 cm as well as another 3.1 cm poorly defined left hepatic lobe hypodensity.  This was followed by an MRI of the abdomen with and without contrast which showed signs of malignancy with mass in the region of the hepatogastric recess near the pancreatic head that narrows vascular structures and is associate with hepatic metastatic disease.  The pattern noted may be more likely related to esophageal neoplasm or metastatic involvement and pancreatic neoplasm felt to be less likely.  He underwent upper endoscopy on 6/27 which showed a large ulcerating mass with friable mucosa and bleeding on contact found in the mid third of the esophagus and biopsies were taken.  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  A CEA and CA 19.9 have been obtained and results are currently pending.  The patient has mild anemia with a hemoglobin of 12.6 which was obtained yesterday with a low MCV of 78.8.   His folate level was normal, vitamin B12 was low at 120, ferritin was normal, but iron level was low at 11, percent saturation was low at 5% and TIBC was low at 210.  A dose of Feraheme 510 mg IV was ordered on 6/26 but this was stopped early secondary to development of itching.  He received a one-time dose of vitamin B12 1000 mcg IM on 6/26.  The patient is resting quietly in bed at the time my visit.  He tells me that he is hoping to go home soon.  He continues to report chest pain on the left side of his chest which radiates up to his shoulder and down his back.  He tells me that recently he has had no appetite and has lost about 40 pounds in the past few weeks.  He denies dysphagia today, however, he has reported dysphagia earlier this admission and was seen by speech therapy earlier today who recommends a dysphagia 3 diet with thin liquids.Marland Kitchen  He reports intermittent abdominal discomfort, nausea, vomiting.  He also reports constipation has not moved his bowels since prior to admission.  He has not seen any recent hematochezia or melena.  He denies recent fevers, chills, headaches, dizziness, chest pain, shortness of breath.  Denies lower extremity edema.  The patient is single and has no children.  He lives by himself.  He has friends who are available to help him particularly with transportation as he does not drive.  He is otherwise independent with ADLs and IADLs.  He tells me that he smokes about 3 packs of cigarettes per week but is planning to quit.  He previously drank alcohol but quit about 7 years ago.  Family  history negative for malignancy.  Medical oncology was asked see the patient for recommendations regarding his newly diagnosed squamous cell carcinoma of the esophagus.  Past Medical History:  Diagnosis Date   Cat bite of left hand 05/11/2015   COPD (chronic obstructive pulmonary disease) (HCC)    Smoker   :   Past Surgical History:  Procedure Laterality Date   APPENDECTOMY     I &  D EXTREMITY Left 05/13/2015   Procedure: INCISION AND DRAINAGE OF LEFT EXTREMITY;  Surgeon: Charlotte Crumb, MD;  Location: [facility];  Service: Orthopedics;  Laterality: Left;   INCISION AND DRAINAGE Left 05/13/2015  :   Current Facility-Administered Medications  Medication Dose Route Frequency Provider Last Rate Last Admin   acetaminophen (TYLENOL) tablet 650 mg  650 mg Oral Q8H PRN Hall, Carole N, DO       albuterol (PROVENTIL) (2.5 MG/3ML) 0.083% nebulizer solution 2.5 mg  2.5 mg Nebulization Q4H PRN Kathie Dike, MD       budesonide (PULMICORT) nebulizer solution 0.25 mg  0.25 mg Nebulization BID Kathie Dike, MD   0.25 mg at 07/19/20 0900   enoxaparin (LOVENOX) injection 40 mg  40 mg Subcutaneous QHS Hall, Carole N, DO   40 mg at 07/18/20 2139   HYDROmorphone (DILAUDID) injection 1 mg  1 mg Intravenous Q4H PRN Kathie Dike, MD   1 mg at 07/19/20 1123   ipratropium-albuterol (DUONEB) 0.5-2.5 (3) MG/3ML nebulizer solution 3 mL  3 mL Nebulization BID Kathie Dike, MD   3 mL at 07/19/20 0858   lactose free nutrition (BOOST PLUS) liquid 237 mL  237 mL Oral TID WC Kathie Dike, MD       melatonin tablet 3 mg  3 mg Oral QHS PRN Irene Pap N, DO       metoprolol tartrate (LOPRESSOR) tablet 25 mg  25 mg Oral BID Kathie Dike, MD   25 mg at 07/19/20 1700   multivitamin with minerals tablet 1 tablet  1 tablet Oral Daily Kathie Dike, MD       oxyCODONE (Oxy IR/ROXICODONE) immediate release tablet 5 mg  5 mg Oral Q6H PRN Irene Pap N, DO   5 mg at 07/19/20 0651   pantoprazole (PROTONIX) injection 40 mg  40 mg Intravenous Q12H Esterwood, Amy S, PA-C   40 mg at 07/19/20 1749   polyethylene glycol (MIRALAX / GLYCOLAX) packet 17 g  17 g Oral Daily PRN Irene Pap N, DO       senna-docusate (Senokot-S) tablet 2 tablet  2 tablet Oral QHS Irene Pap N, DO   2 tablet at 07/18/20 2139     No Known Allergies:  History reviewed. No pertinent family history.:   Social History    Socioeconomic History   Marital status: Single    Spouse name: Not on file   Number of children: Not on file   Years of education: Not on file   Highest education level: Not on file  Occupational History   Not on file  Tobacco Use   Smoking status: Every Day    Packs/day: 0.25    Years: 20.00    Pack years: 5.00    Types: Cigarettes   Smokeless tobacco: Never  Vaping Use   Vaping Use: Never used  Substance and Sexual Activity   Alcohol use: No   Drug use: No   Sexual activity: Not on file  Other Topics Concern   Not on file  Social History Narrative   Not on file  Social Determinants of Health   Financial Resource Strain: Not on file  Food Insecurity: Not on file  Transportation Needs: Not on file  Physical Activity: Not on file  Stress: Not on file  Social Connections: Not on file  Intimate Partner Violence: Not on file  :  Review of Systems: A comprehensive 14 point review of systems was negative except as noted in the HPI.  Exam: Patient Vitals for the past 24 hrs:  BP Temp Temp src Pulse Resp SpO2  07/19/20 0900 -- -- -- -- -- 95 %  07/19/20 0858 -- -- -- -- -- 95 %  07/19/20 0832 124/85 -- -- 100 -- --  07/19/20 0500 127/90 98.1 F (36.7 C) Oral 88 18 94 %  07/18/20 2100 131/85 99.8 F (37.7 C) Oral 95 18 93 %  07/18/20 2043 -- -- -- -- -- 97 %  07/18/20 1750 (!) 143/93 -- -- 88 -- 90 %  07/18/20 1720 (!) 142/89 -- -- 83 -- 92 %  07/18/20 1650 (!) 148/91 -- -- 83 -- 91 %  07/18/20 1620 134/87 -- -- 75 -- 95 %  07/18/20 1550 (!) 150/89 -- -- 78 -- 90 %  07/18/20 1520 (!) 141/84 -- -- 80 -- 95 %  07/18/20 1510 (!) 144/92 -- -- 78 -- 95 %    General: Resting in bed quietly, no distress Eyes:  no scleral icterus.   ENT:  There were no oropharyngeal lesions.   Lymphatics:  Negative cervical, supraclavicular or axillary adenopathy.   Respiratory: lungs were clear bilaterally without wheezing or crackles.   Cardiovascular:  Regular rate and rhythm,  S1/S2, without murmur, rub or gallop.  There was no pedal edema.   GI: Positive bowel sounds, no palpable masses, nontender. Musculoskeletal: Strength symmetrical in the upper and lower extremities. Skin exam was without echymosis, petichae.   Neuro exam was nonfocal. Patient was alert and oriented.  Attention was good.   Language was appropriate.  Mood was normal without depression.  Speech was not pressured.  Thought content was not tangential.     Lab Results  Component Value Date   WBC 8.5 07/18/2020   HGB 12.6 (L) 07/18/2020   HCT 38.2 (L) 07/18/2020   PLT 165 07/18/2020   GLUCOSE 111 (H) 07/18/2020   ALT 8 07/17/2020   AST 10 (L) 07/17/2020   NA 137 07/18/2020   K 4.6 07/18/2020   CL 99 07/18/2020   CREATININE 1.14 07/18/2020   BUN 14 07/18/2020   CO2 27 07/18/2020    DG Chest 2 View  Result Date: 07/16/2020 CLINICAL DATA:  Left-sided chest pain and back pain EXAM: CHEST - 2 VIEW COMPARISON:  10/20/2014 FINDINGS: The heart size and mediastinal contours are within normal limits. Pulmonary hyperinflation. The visualized skeletal structures are unremarkable. IMPRESSION: Pulmonary hyperinflation without acute abnormality of the lungs. Electronically Signed   By: Eddie Candle M.D.   On: 07/16/2020 20:14   MR ABDOMEN W WO CONTRAST  Result Date: 07/18/2020 CLINICAL DATA:  Suspected liver metastasis in the setting of pancreatic neoplasm. EXAM: MRI ABDOMEN WITHOUT AND WITH CONTRAST TECHNIQUE: Multiplanar multisequence MR imaging of the abdomen was performed both before and after the administration of intravenous contrast. CONTRAST:  38mL GADAVIST GADOBUTROL 1 MMOL/ML IV SOLN COMPARISON:  CT evaluation of July 16, 2020. FINDINGS: Lower chest: Diffuse esophageal thickening seen on recent CT of the chest, abdomen and pelvis is not evaluated. There is no dense consolidation. Small bilateral pleural effusions are present.  Hepatobiliary: Hepatic iron deposition makes liver assessment limited as  does the presence of respiratory motion. There are multiple hepatic lesions. Lesions in terms of enhancement are not well evaluated due to the presence of background signal within the vasculature related to recent Feraheme infusion based on appearance. For this reason the pancreas is also not well evaluated on precontrast imaging. The portal vein is narrowed about the pancreas, main portal vein. The SMV appears patent. Narrowing of the portal vein is in the mid portal vein adjacent to the large mass which is centered in the hepatogastric recess more than in the pancreatic head. Hepatic lesions, dominant lesion in the LEFT hepatic lobe measuring 3.4 x 2.8 cm without signal to suggest cyst, subtraction images with enhancement pattern compatible with metastatic lesion on subtraction. Motion degraded study. No pericholecystic stranding.  No biliary duct dilation. The lesion in the anterior LEFT hemi liver, hepatic subsegment II (image 14 of series 19) also likely a metastatic focus. The presence of profound iron deposition in the liver again limiting assessment of these areas. Other areas of diffusion related signal in the hepatic parenchyma at least 4 additional small areas, some in the RIGHT hepatic lobe largest near the IVC confluence on image 78 of series 10 measures 14 mm. Inferior LEFT hepatic lobe lesion seen on previous CT approximately 2 cm (image 19/5) also suspicious for metastasis. Pancreas: Pancreas also showing very low intrinsic "pre contrast" T1 signal compatible with iron deposition likely related to recent Feraheme administration. Large mass adjacent to or arising from the pancreas in the gastrohepatic region tracking into the hepato gastric recess measuring as much as 5 x 5.2 cm (image 47/10). No pancreatic ductal dilation despite large mass in the region. Spleen:  Spleen normal size and contour, no focal lesion. Adrenals/Urinary Tract:  Adrenal glands are normal. Renal cysts bilaterally.  Stomach/Bowel: No acute gastrointestinal process to the extent evaluated. Esophageal abnormalities not visualized on CT. Vascular/Lymphatic: Portal venous narrowing in the mid portal vein, high-grade narrowing. Signal remains within the portal vein and within the SMV. No adenopathy below the renal veins. Celiac adenopathy and RIGHT juxta crural nodal enlargement behind the inferior vena cava nearly contiguous with large mass region of the gastrohepatic ligament. Other:  Trace ascites. Musculoskeletal: No suspicious bone lesions identified. But signs of diffuse iron deposition. IMPRESSION: Signs of malignancy with mass in the region of the hepatogastric recess near the pancreatic head that narrows vascular structures and is associated with hepatic metastatic disease. Exam limited by recent presumed administration of Feraheme which confounds assessment. 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 Pancreatic neoplasm is felt less likely given the lack of peripheral ductal dilation though assessment is quite limited. Consider upper endoscopy and endoscopic ultrasound evaluation for further assessment. Further staging should be performed with CT for at least 60-90 days given the recent iron infusion. Would defer decision for the type per protocol to be utilized until after the endoscopy. Electronically Signed   By: Zetta Bills M.D.   On: 07/18/2020 07:48   CT Angio Chest/Abd/Pel for Dissection W and/or Wo Contrast  Result Date: 07/16/2020 CLINICAL DATA:  Abdominal pain, aortic dissection suspected EXAM: CT ANGIOGRAPHY CHEST, ABDOMEN AND PELVIS TECHNIQUE: Non-contrast CT of the chest was initially obtained. Multidetector CT imaging through the chest, abdomen and pelvis was performed using the standard protocol during bolus administration of intravenous contrast. Multiplanar reconstructed images and MIPs were obtained and reviewed to evaluate the  vascular anatomy. CONTRAST:  119mL OMNIPAQUE IOHEXOL 350 MG/ML SOLN COMPARISON:  CT angiography chest 10/20/2014 FINDINGS: CTA CHEST FINDINGS Cardiovascular: Satisfactory opacification of the pulmonary arteries to the segmental level. No evidence of pulmonary embolism. Normal heart size. No pericardial effusion. Mediastinum/Nodes: Limited evaluation for mediastinal lymphadenopathy due to timing of contrast. Question subcarinal lymph node. No enlarged hilar or axillary lymph nodes. Thyroid gland and trachea demonstrate no significant findings. Marked diffuse esophageal wall thickening and haziness. Associated posterior mediastinal haziness and soft tissue density. Soft tissue density in haziness abuts the posterior trachea and carina. Lungs/Pleura: No pulmonary nodule. No pulmonary mass. Thin consolidation along the pleura within the posterior right middle lobe. No pleural effusion. No pneumothorax. Musculoskeletal: No chest wall abnormality. No suspicious lytic or blastic osseous lesions. No acute displaced fracture. Multilevel degenerative changes of the spine. Review of the MIP images confirms the above findings. CTA ABDOMEN AND PELVIS FINDINGS VASCULAR Aorta: At least moderate atherosclerotic plaque. Normal caliber aorta without aneurysm, dissection, vasculitis or significant stenosis. Celiac: Mild atherosclerotic plaque. Patent without evidence of aneurysm, dissection, vasculitis or significant stenosis. SMA: Mild atherosclerotic plaque. Patent without evidence of aneurysm, dissection, vasculitis or significant stenosis. Renals: Both renal arteries are patent without evidence of aneurysm, dissection, vasculitis, fibromuscular dysplasia or significant stenosis. IMA: Patent without evidence of aneurysm, dissection, vasculitis or significant stenosis. Inflow: At least moderate atherosclerotic plaque. Patent without evidence of aneurysm, dissection, vasculitis or significant stenosis. Veins: No obvious venous  abnormality within the limitations of this arterial phase study. Review of the MIP images confirms the above findings. NON-VASCULAR Hepatobiliary: There is a 1.5 cm hypodensity within the left hepatic lobe (7:128) as well as a poorly defined 3.1 cm hypodensity within the inferior left lateral hepatic lobe (7:168). No gallstones, gallbladder wall thickening, or pericholecystic fluid. No biliary dilatation. Pancreas: Question 1.6 cm proximal pancreatic mass with markedly limited evaluation due to timing of contrast. Spleen: Normal in size without focal abnormality. Adrenals/Urinary Tract: No adrenal nodule bilaterally. Bilateral kidneys enhance symmetrically. There is a 1.3 cm fluid density lesion within the left kidney that likely represents a simple renal cyst. Subcentimeter hypodensities are too small to characterize. No hydronephrosis. No hydroureter. The urinary bladder is unremarkable. Stomach/Bowel: Stomach is within normal limits. No evidence of bowel wall thickening or dilatation. Lymphatic: No definite lymphadenopathy. Reproductive: Prostate is unremarkable. Other: No intraperitoneal free fluid. No intraperitoneal free gas. No organized fluid collection. Musculoskeletal: No abdominal wall hernia or abnormality. No suspicious lytic or blastic osseous lesions. No acute displaced fracture. Multilevel degenerative changes of the spine. Review of the MIP images confirms the above findings. IMPRESSION: 1. No acute vascular abnormality. 2. Marked diffuse esophageal wall thickening and haziness as well as surrounding posterior mediastinal haziness/fat stranding. Findings concerning for malignancy versus inflammation. Recommend direct visualization. 3. Mediastinal lymphadenopathy. 4. Question 1.6 cm proximal pancreatic mass with markedly limited evaluation due to timing of contrast. Concern for malignancy. Recommend MRI pancreatic protocol for further evaluation. 5. Indeterminate 1.5 cm as well as another 3.1 cm  poorly defined left hepatic lobe hypodensity. These finding can be further evaluated on MRI pancreatic protocol. Electronically Signed   By: Iven Finn M.D.   On: 07/16/2020 23:42     DG Chest 2 View  Result Date: 07/16/2020 CLINICAL DATA:  Left-sided chest pain and back pain EXAM: CHEST - 2 VIEW COMPARISON:  10/20/2014 FINDINGS: The heart size and mediastinal contours are within normal limits. Pulmonary hyperinflation. The visualized skeletal structures are unremarkable. IMPRESSION: Pulmonary hyperinflation without  acute abnormality of the lungs. Electronically Signed   By: Eddie Candle M.D.   On: 07/16/2020 20:14   MR ABDOMEN W WO CONTRAST  Result Date: 07/18/2020 CLINICAL DATA:  Suspected liver metastasis in the setting of pancreatic neoplasm. EXAM: MRI ABDOMEN WITHOUT AND WITH CONTRAST TECHNIQUE: Multiplanar multisequence MR imaging of the abdomen was performed both before and after the administration of intravenous contrast. CONTRAST:  49mL GADAVIST GADOBUTROL 1 MMOL/ML IV SOLN COMPARISON:  CT evaluation of July 16, 2020. FINDINGS: Lower chest: Diffuse esophageal thickening seen on recent CT of the chest, abdomen and pelvis is not evaluated. There is no dense consolidation. Small bilateral pleural effusions are present. Hepatobiliary: Hepatic iron deposition makes liver assessment limited as does the presence of respiratory motion. There are multiple hepatic lesions. Lesions in terms of enhancement are not well evaluated due to the presence of background signal within the vasculature related to recent Feraheme infusion based on appearance. For this reason the pancreas is also not well evaluated on precontrast imaging. The portal vein is narrowed about the pancreas, main portal vein. The SMV appears patent. Narrowing of the portal vein is in the mid portal vein adjacent to the large mass which is centered in the hepatogastric recess more than in the pancreatic head. Hepatic lesions, dominant lesion  in the LEFT hepatic lobe measuring 3.4 x 2.8 cm without signal to suggest cyst, subtraction images with enhancement pattern compatible with metastatic lesion on subtraction. Motion degraded study. No pericholecystic stranding.  No biliary duct dilation. The lesion in the anterior LEFT hemi liver, hepatic subsegment II (image 14 of series 19) also likely a metastatic focus. The presence of profound iron deposition in the liver again limiting assessment of these areas. Other areas of diffusion related signal in the hepatic parenchyma at least 4 additional small areas, some in the RIGHT hepatic lobe largest near the IVC confluence on image 78 of series 10 measures 14 mm. Inferior LEFT hepatic lobe lesion seen on previous CT approximately 2 cm (image 19/5) also suspicious for metastasis. Pancreas: Pancreas also showing very low intrinsic "pre contrast" T1 signal compatible with iron deposition likely related to recent Feraheme administration. Large mass adjacent to or arising from the pancreas in the gastrohepatic region tracking into the hepato gastric recess measuring as much as 5 x 5.2 cm (image 47/10). No pancreatic ductal dilation despite large mass in the region. Spleen:  Spleen normal size and contour, no focal lesion. Adrenals/Urinary Tract:  Adrenal glands are normal. Renal cysts bilaterally. Stomach/Bowel: No acute gastrointestinal process to the extent evaluated. Esophageal abnormalities not visualized on CT. Vascular/Lymphatic: Portal venous narrowing in the mid portal vein, high-grade narrowing. Signal remains within the portal vein and within the SMV. No adenopathy below the renal veins. Celiac adenopathy and RIGHT juxta crural nodal enlargement behind the inferior vena cava nearly contiguous with large mass region of the gastrohepatic ligament. Other:  Trace ascites. Musculoskeletal: No suspicious bone lesions identified. But signs of diffuse iron deposition. IMPRESSION: Signs of malignancy with mass in  the region of the hepatogastric recess near the pancreatic head that narrows vascular structures and is associated with hepatic metastatic disease. Exam limited by recent presumed administration of Feraheme which confounds assessment. 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 Pancreatic neoplasm is felt less likely given the lack of peripheral ductal dilation though assessment is quite limited. Consider upper endoscopy and endoscopic ultrasound evaluation for further assessment. Further staging should be  performed with CT for at least 60-90 days given the recent iron infusion. Would defer decision for the type per protocol to be utilized until after the endoscopy. Electronically Signed   By: Zetta Bills M.D.   On: 07/18/2020 07:48   CT Angio Chest/Abd/Pel for Dissection W and/or Wo Contrast  Result Date: 07/16/2020 CLINICAL DATA:  Abdominal pain, aortic dissection suspected EXAM: CT ANGIOGRAPHY CHEST, ABDOMEN AND PELVIS TECHNIQUE: Non-contrast CT of the chest was initially obtained. Multidetector CT imaging through the chest, abdomen and pelvis was performed using the standard protocol during bolus administration of intravenous contrast. Multiplanar reconstructed images and MIPs were obtained and reviewed to evaluate the vascular anatomy. CONTRAST:  139mL OMNIPAQUE IOHEXOL 350 MG/ML SOLN COMPARISON:  CT angiography chest 10/20/2014 FINDINGS: CTA CHEST FINDINGS Cardiovascular: Satisfactory opacification of the pulmonary arteries to the segmental level. No evidence of pulmonary embolism. Normal heart size. No pericardial effusion. Mediastinum/Nodes: Limited evaluation for mediastinal lymphadenopathy due to timing of contrast. Question subcarinal lymph node. No enlarged hilar or axillary lymph nodes. Thyroid gland and trachea demonstrate no significant findings. Marked diffuse esophageal wall thickening and haziness. Associated posterior  mediastinal haziness and soft tissue density. Soft tissue density in haziness abuts the posterior trachea and carina. Lungs/Pleura: No pulmonary nodule. No pulmonary mass. Thin consolidation along the pleura within the posterior right middle lobe. No pleural effusion. No pneumothorax. Musculoskeletal: No chest wall abnormality. No suspicious lytic or blastic osseous lesions. No acute displaced fracture. Multilevel degenerative changes of the spine. Review of the MIP images confirms the above findings. CTA ABDOMEN AND PELVIS FINDINGS VASCULAR Aorta: At least moderate atherosclerotic plaque. Normal caliber aorta without aneurysm, dissection, vasculitis or significant stenosis. Celiac: Mild atherosclerotic plaque. Patent without evidence of aneurysm, dissection, vasculitis or significant stenosis. SMA: Mild atherosclerotic plaque. Patent without evidence of aneurysm, dissection, vasculitis or significant stenosis. Renals: Both renal arteries are patent without evidence of aneurysm, dissection, vasculitis, fibromuscular dysplasia or significant stenosis. IMA: Patent without evidence of aneurysm, dissection, vasculitis or significant stenosis. Inflow: At least moderate atherosclerotic plaque. Patent without evidence of aneurysm, dissection, vasculitis or significant stenosis. Veins: No obvious venous abnormality within the limitations of this arterial phase study. Review of the MIP images confirms the above findings. NON-VASCULAR Hepatobiliary: There is a 1.5 cm hypodensity within the left hepatic lobe (7:128) as well as a poorly defined 3.1 cm hypodensity within the inferior left lateral hepatic lobe (7:168). No gallstones, gallbladder wall thickening, or pericholecystic fluid. No biliary dilatation. Pancreas: Question 1.6 cm proximal pancreatic mass with markedly limited evaluation due to timing of contrast. Spleen: Normal in size without focal abnormality. Adrenals/Urinary Tract: No adrenal nodule bilaterally.  Bilateral kidneys enhance symmetrically. There is a 1.3 cm fluid density lesion within the left kidney that likely represents a simple renal cyst. Subcentimeter hypodensities are too small to characterize. No hydronephrosis. No hydroureter. The urinary bladder is unremarkable. Stomach/Bowel: Stomach is within normal limits. No evidence of bowel wall thickening or dilatation. Lymphatic: No definite lymphadenopathy. Reproductive: Prostate is unremarkable. Other: No intraperitoneal free fluid. No intraperitoneal free gas. No organized fluid collection. Musculoskeletal: No abdominal wall hernia or abnormality. No suspicious lytic or blastic osseous lesions. No acute displaced fracture. Multilevel degenerative changes of the spine. Review of the MIP images confirms the above findings. IMPRESSION: 1. No acute vascular abnormality. 2. Marked diffuse esophageal wall thickening and haziness as well as surrounding posterior mediastinal haziness/fat stranding. Findings concerning for malignancy versus inflammation. Recommend direct visualization. 3. Mediastinal lymphadenopathy. 4. Question 1.6 cm  proximal pancreatic mass with markedly limited evaluation due to timing of contrast. Concern for malignancy. Recommend MRI pancreatic protocol for further evaluation. 5. Indeterminate 1.5 cm as well as another 3.1 cm poorly defined left hepatic lobe hypodensity. These finding can be further evaluated on MRI pancreatic protocol. Electronically Signed   By: Iven Finn M.D.   On: 07/16/2020 23:42    Pathology:  SURGICAL PATHOLOGY  CASE: [id number]  PATIENT: [facility]  Surgical Pathology Report   Clinical History: dysphagia, weight loss, chest pain, abnormal CT, R/O  cancer (cm)   FINAL MICROSCOPIC DIAGNOSIS:   A. ESOPHAGUS MASS, BIOPSY:  - Squamous cell carcinoma.  - See comment.   COMMENT:  Dr. Vic Ripper agrees.   Call to Dr. Silverio Decamp on 07/19/2020.   Assessment and Plan:  This is a 60 year old male  with  1.  Squamous cell carcinoma of the esophagus.  Biopsy and imaging results of been discussed with the patient today.  His MRI is limited but concerning for possible metastatic disease to the liver and hepatic gastric recess and the pancreatic head.  ECOG performance status 2.  Treatment options to be discussed later today by Dr. Alen Blew.  2.  Anemia secondary to iron deficiency and vitamin B12 deficiency.  The patient was trialed on Feraheme but did not tolerate due to itching.  This was discontinued early.  We can consider rechallenge with Feraheme with premedications versus switching to an alternative form of IV iron.  This can be performed as an outpatient depending on plans for discharge.  He received a one-time dose of vitamin B12.  Will likely need to repeat this given his low vitamin B12.  3.  Dysphagia.  Secondary to esophageal mass.  Speech therapy has evaluated him and has recommended dysphagia 3 diet with thin liquids.  4.  Protein calorie malnutrition.  Dietitian following.  Thank you for this referral.   Mikey Bussing, DNP, AGPCNP-BC, AOCNP    Patient seen and examined and I agree with the note by Altamese Dilling NP.  This is a 60 year old man with dysphagia and chest pain found to have squamous cell carcinoma of the distal esophagus with findings concerning for advanced disease with pancreatic involvement and possibly hepatic mass.     Clinically, he reports feeling reasonably dysphagia for solids well at this time.  He does have dysphagia associated with solid food but no issues with liquid diet at this time.  He denies any shortness of breath or difficulty breathing.  On exam alert, awake gentleman.  Without any significant distress.  Heart was regular rate and rhythm lungs are clear abdomen soft and nontender.  Extremities showed no edema.  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  He would  be a reasonable candidate for palliative treatment utilizing radiation therapy concomitantly with chemotherapy or systemic chemotherapy alone.  Given his dysphagia I favor treating locally with radiation with chemotherapy and subsequently using systemic therapy.  I will arrange a follow-up upon discharge from an oncology standpoint.  No additional testing is needed while he is in the hospital.  I will arrange for a PET scan as an outpatient.  Please call with any questions regarding this patient.   80  minutes were dedicated to this visit.  50% of the time was face-to-face and the time was spent on reviewing laboratory data, imaging studies, discussing treatment options,  and answering questions regarding future plan.

## 2020-07-19 NOTE — Progress Notes (Signed)
Initial Nutrition Assessment  DOCUMENTATION CODES:  Severe malnutrition in context of acute illness/injury  INTERVENTION:  Discontinue Ensure Enlive BID.  Add Boost Plus po TID, each supplement provides 360 kcal and 14 grams of protein.  Add Magic cup TID with meals, each supplement provides 290 kcal and 9 grams of protein.  Add MVI with minerals daily.  NUTRITION DIAGNOSIS:  Severe Malnutrition related to acute illness (possible malignancy) as evidenced by energy intake < or equal to 50% for > or equal to 5 days, percent weight loss.  GOAL:  Patient will meet greater than or equal to 90% of their needs  MONITOR:  PO intake, Supplement acceptance, Diet advancement, Labs, Weight trends, I & O's  REASON FOR ASSESSMENT:  Malnutrition Screening Tool    ASSESSMENT:  60 yo male with a PMH of COPD and tobacco use who presents with chest pain, SOB, and unintentional weight loss. EGD on 6/27 showed esophageal thickening. S/p EGD 6/27 that showed esophageal mass; possible pancreatic mass and hepatic metastases; biopsies pending. 6/27 - EGD (biopsies pending)  RD working remotely.  Spoke with pt over the phone. Pt reports that he has been eating less for the last 2 months or so because he does not have an appetite. He reports that he has been having a sausage/egg biscuit sometimes for breakfast and a bologna and PB&J for lunch while at work, but no evening meal. He reports his appetite has improved somewhat since admission. Per Epic, pt ate 100% of lunch last night and breakfast this morning.  Pt reports a weight decrease from 165 lbs to 126 lbs during this 2 month time span. No recent history in Epic or Care Everywhere to confirm. Pt has lost 39 lbs (23%) of his body weight in 2 months, which is significant and severe for the time frame.  Given above information, pt is severely malnourished in the acute setting.   RD unable to conduct NFPE at this time given working remotely, but likely  would find severe depletions in both fat and muscle stores given above information as well. NFPE will be conducted at follow-up.  To increase intake, recommend adding Boost Plus TID and Magic Cup TID, as well as MVI with minerals.  Medications: reviewed; EE BID, Protonix, Senokot, Dilaudid PRN (given once today), oxycodone PRN (given once today)  Labs: reviewed; Glucose 111  NUTRITION - FOCUSED PHYSICAL EXAM: Unable to perform  Diet Order:   Diet Order             DIET SOFT Room service appropriate? Yes; Fluid consistency: Thin  Diet effective now                  EDUCATION NEEDS:  Education needs have been addressed  Skin:  Skin Assessment: Reviewed RN Assessment  Last BM:  07/16/20 - OBR in place  Height:  Ht Readings from Last 1 Encounters:  07/18/20 5\' 8"  (1.727 m)   Weight:  Wt Readings from Last 1 Encounters:  07/18/20 57.2 kg   Ideal Body Weight:     BMI:  Body mass index is 19.16 kg/m.  Estimated Nutritional Needs:  Kcal:  2200-2400 Protein:  85-100 grams Fluid:  >2 L  Derrel Nip, RD, LDN (she/her/hers) Registered Dietitian I After-Hours/Weekend Pager # in [place]

## 2020-07-20 ENCOUNTER — Other Ambulatory Visit (HOSPITAL_COMMUNITY): Payer: Self-pay

## 2020-07-20 ENCOUNTER — Inpatient Hospital Stay (HOSPITAL_COMMUNITY): Payer: Medicaid Other

## 2020-07-20 DIAGNOSIS — Z7189 Other specified counseling: Secondary | ICD-10-CM

## 2020-07-20 DIAGNOSIS — J9601 Acute respiratory failure with hypoxia: Secondary | ICD-10-CM

## 2020-07-20 DIAGNOSIS — Z515 Encounter for palliative care: Secondary | ICD-10-CM

## 2020-07-20 DIAGNOSIS — C159 Malignant neoplasm of esophagus, unspecified: Secondary | ICD-10-CM

## 2020-07-20 MED ORDER — METOPROLOL TARTRATE 25 MG PO TABS
25.0000 mg | ORAL_TABLET | Freq: Two times a day (BID) | ORAL | 0 refills | Status: DC
  Filled 2020-07-20: qty 60, 30d supply, fill #0

## 2020-07-20 MED ORDER — OXYCODONE HCL 5 MG PO TABS
5.0000 mg | ORAL_TABLET | Freq: Four times a day (QID) | ORAL | 0 refills | Status: DC | PRN
  Filled 2020-07-20: qty 15, 4d supply, fill #0

## 2020-07-20 MED ORDER — FOOD THICKENER (SIMPLYTHICK)
1.0000 | ORAL | 0 refills | Status: DC | PRN
  Filled 2020-07-20: qty 30, fill #0

## 2020-07-20 MED ORDER — FOOD THICKENER (SIMPLYTHICK): 1.0000 | ORAL | Status: DC | PRN

## 2020-07-20 NOTE — Consult Note (Signed)
Consultation Note Date: 07/20/2020   Patient Name: Christian Newton  DOB: [birth year]  MRN: [id number]  Age / Sex: 60 y.o., male  PCP: Pcp, No Referring Physician: Donne Hazel, MD  Reason for Consultation: Establishing goals of care  HPI/Patient Profile: 60 y.o. male  with past medical history of COPD, tobacco use, admitted on 07/16/2020 with chest pain, dyspnea. He has lost a great deal of weight recently. Workup reveals squamous cell cancer of the esophagus with likely hepatic metastasis. Oncology consulted and recommending Palliative radiation and chemotherapy. Palliative consulted for "goals of care".   Clinical Assessment and Goals of Care: I have reviewed medical records including EPIC notes, labs and imaging, received report from RN, assessed the patient and then met at the bedside along with the patient to discuss diagnosis prognosis, GOC, EOL wishes, disposition and options.  I introduced Palliative Medicine as specialized medical care for people living with serious illness. It focuses on providing relief from the symptoms and stress of a serious illness. The goal is to improve quality of life for both the patient and the family.  We discussed a brief life review of the patient and then focused on their current illness. The natural disease trajectory and expectations at EOL were discussed.   [name] is sitting up at bedside. He tells me he is being discharged today and plans to go to the [facility]. He was previously living at home independently.   We discussed his new diagnosis. He understands he has cancer. He expects a full recovery. With his permission I shared with him the worry about a metastatic cancer- that it can be treated and slowed- but sometimes is what people die from.  Advanced directives, concepts specific to code status, artifical feeding and hydration, and rehospitalization  were considered and discussed.  [name] has not considered his own mortality previously. He is unsure if he would want to be resuscitated.  If [name] was unable to make his own decisions then his brother- [name] would be his surrogate Media planner. I encouraged him to consider his wishes regarding artificial life support and resuscitation and to discuss these with his brother.   Hospice and Palliative Care services outpatient were explained and offered. [name] is amenable to referral for outpatient Palliative.   Discussed the importance of continued conversation with family and the medical providers regarding overall plan of care and treatment options, ensuring decisions are within the context of the patient's values and GOCs.    Questions and concerns were addressed.    Primary Decision Maker PATIENT    SUMMARY OF RECOMMENDATIONS -Continue current plan of care -Patient expecting to discharge today to [facility] order placed to facilitate referral to outpatient Palliative -PMT will sign off for now- please reconsult if further assistance is needed    Code Status/Advance Care Planning: Full code    Additional Recommendations (Limitations, Scope, Preferences): Full Scope Treatment  Prognosis:   Unable to determine  Discharge Planning: Home with Palliative Services  Primary Diagnoses: Present on  Admission:  Chest pain  Pancreatic mass  Acute respiratory failure with hypoxia (HCC)  Unintentional weight loss  Smoker  B12 deficiency  Iron deficiency  Esophageal mass   I have reviewed the medical record, interviewed the patient and family, and examined the patient. The following aspects are pertinent.  Past Medical History:  Diagnosis Date   Cat bite of left hand 05/11/2015   COPD (chronic obstructive pulmonary disease) (HCC)    Smoker    Social History   Socioeconomic History   Marital status: Single    Spouse name: Not on file   Number of  children: Not on file   Years of education: Not on file   Highest education level: Not on file  Occupational History   Not on file  Tobacco Use   Smoking status: Every Day    Packs/day: 0.25    Years: 20.00    Pack years: 5.00    Types: Cigarettes   Smokeless tobacco: Never  Vaping Use   Vaping Use: Never used  Substance and Sexual Activity   Alcohol use: No   Drug use: No   Sexual activity: Not on file  Other Topics Concern   Not on file  Social History Narrative   Not on file   Social Determinants of Health   Financial Resource Strain: Not on file  Food Insecurity: Not on file  Transportation Needs: Not on file  Physical Activity: Not on file  Stress: Not on file  Social Connections: Not on file   Scheduled Meds:  budesonide (PULMICORT) nebulizer solution  0.25 mg Nebulization BID   enoxaparin (LOVENOX) injection  40 mg Subcutaneous QHS   ipratropium-albuterol  3 mL Nebulization BID   lactose free nutrition  237 mL Oral TID WC   metoprolol tartrate  25 mg Oral BID   multivitamin with minerals  1 tablet Oral Daily   pantoprazole (PROTONIX) IV  40 mg Intravenous Q12H   senna-docusate  2 tablet Oral QHS   Continuous Infusions: PRN Meds:.acetaminophen, albuterol, food thickener, HYDROmorphone (DILAUDID) injection, melatonin, oxyCODONE, polyethylene glycol Medications Prior to Admission:  Prior to Admission medications   Medication Sig Start Date End Date Taking? Authorizing Provider  acetaminophen (TYLENOL) 500 MG tablet Take 500 mg by mouth every 6 (six) hours as needed for mild pain or headache.   Yes [provider]  albuterol (PROVENTIL HFA;VENTOLIN HFA) 108 (90 BASE) MCG/ACT inhaler Inhale 2 puffs into the lungs every 6 (six) hours as needed for wheezing or shortness of breath. 10/21/14 05/12/15  Velvet Bathe, MD   No Known Allergies Review of Systems  Physical Exam  Vital Signs: BP (!) 152/85   Pulse 94   Temp 98.3 F (36.8 C) (Oral)   Resp 18    Ht 5' 8"  (1.727 m)   Wt 57.2 kg   SpO2 93%   BMI 19.16 kg/m  Pain Scale: 0-10   Pain Score: 2    SpO2: SpO2: 93 % O2 Device:SpO2: 93 % O2 Flow Rate: .O2 Flow Rate (L/min): 3 L/min  IO: Intake/output summary:  Intake/Output Summary (Last 24 hours) at 07/20/2020 1217 Last data filed at 07/20/2020 1610 Gross per 24 hour  Intake --  Output 800 ml  Net -800 ml    LBM: Last BM Date: 07/16/20 Baseline Weight: Weight: 57.3 kg Most recent weight: Weight: 57.2 kg     Palliative Assessment/Data:     Thank you for this consult. Palliative medicine will continue to follow and assist as needed.  Time In: 0916 Time Out: 1032 Time Total: 76 mins Greater than 50%  of this time was spent counseling and coordinating care related to the above assessment and plan.  Signed by: Mariana Kaufman, AGNP-C Palliative Medicine    Please contact Palliative Medicine Team phone at [phone] for questions and concerns.  For individual provider: See Shea Evans

## 2020-07-20 NOTE — Progress Notes (Signed)
  Speech Language Pathology Treatment: Dysphagia  Patient Details Name: Christian Newton MRN: [id number] DOB: [birth year] Today's Date: 07/20/2020 Time: 2993-7169 SLP Time Calculation (min) (ACUTE ONLY): 20 min  Assessment / Plan / Recommendation Clinical Impression  Provided further education on result of MBS, process of thickening and resource for home samples of thickening. Submitted order for pt. Pt would benefit from OP SLP f/u as well as ENT consult given dysphonia and dysphagia with suspicion of laryngeal impairment in setting of probable metastatic cancer.   HPI HPI: 60 year old male with a history of tobacco use, admitted to the hospital with complaints of chest discomfort and shortness of breath.  Described persistent chest pain radiating to his back for the past 2 weeks.  He is also had unintentional weight loss, odynophagia, coughing with all POs  CT scan indicated esophageal thickening.  Seen by GI and underwent EGD that indicated esophageal mass.  Further imaging also indicates possible pancreatic mass and hepatic metastases.  Currently biopsies are pending.      SLP Plan          Recommendations  Diet recommendations: Nectar-thick liquid;Dysphagia 3 (mechanical soft) Liquids provided via: Cup;Straw Medication Administration: Whole meds with puree Supervision: Patient able to self feed Compensations: Slow rate;Small sips/bites;Clear throat intermittently Postural Changes and/or Swallow Maneuvers: Seated upright 90 degrees                Follow up Recommendations: Outpatient SLP SLP Visit Diagnosis: Dysphagia, oropharyngeal phase (R13.12)       GO                Mitzy Naron, Katherene Ponto 07/20/2020, 1:50 PM

## 2020-07-20 NOTE — Plan of Care (Signed)

## 2020-07-20 NOTE — Care Management (Signed)
1520 07-20-20 Case Manager received a consult for outpatient palliative services. Case Manager spoke with the patient and he is agreeable to [facility] for palliative services. Case Manager made the referral with [facility], the office to call the patient for a visit time. No further needs from Case Manager at this time.

## 2020-07-20 NOTE — Progress Notes (Signed)
Modified Barium Swallow Progress Note  Patient Details  Name: Christian Newton MRN: [id number] Date of Birth: 05/12/1960  Today's Date: 07/20/2020  Modified Barium Swallow completed.  Full report located under Chart Review in the Imaging Section.  Brief recommendations include the following:  Clinical Impression  Pt demonstrates moderate oropharyngeal dysphagia. Primary problem is decreased laryngeal closure with aspiration during the swallow. Attempted moderating bolus size, which is helpful with small single swallows. Otherwise chin tuck, breath hold, head turn left and right all ineffective in preventing aspiration. Pt is hoarse, there is concern for abnormality of larynx. Cough is ineffective to clear aspirate and cough appears quite irritating and distressing to pt. Pt tolerated soft solids (edentulous) and nectar thick liquids with only trace penetration. Will initiate dys 3/nectar. Pt may benefit from ENT to ensure no additional laryngeal abnormality.Pt will need f/u with OP SLP depending on treatment plan with oncology.   Swallow Evaluation Recommendations       SLP Diet Recommendations: Dysphagia 3 (Mech soft) solids;Nectar thick liquid   Liquid Administration via: Cup;Straw   Medication Administration: Whole meds with liquid   Supervision: Patient able to self feed   Compensations: Slow rate;Small sips/bites;Clear throat intermittently   Postural Changes: Seated upright at 90 degrees   Oral Care Recommendations: Oral care BID       Christian [place], MA [place] Pager [phone] Office [phone]  Christian Newton, Christian Newton 07/20/2020,10:37 AM

## 2020-07-20 NOTE — Discharge Summary (Signed)
Physician Discharge Summary  Roque Schill MHD:[id number] DOB: April 26, 1960 DOA: 07/16/2020  PCP: Pcp, No  Admit date: 07/16/2020 Discharge date: 07/20/2020  Admitted From: Home Disposition:  Home  Recommendations for Outpatient Follow-up:  Follow up with PCP in 1-2 weeks Follow up with Oncology as scheduled  Discharge Condition:Stable CODE STATUS:Full Diet recommendation: Soft with nectar thick liquids   Brief/Interim Summary: 60 year old male with a history of tobacco use, admitted to the hospital with complaints of chest discomfort and shortness of breath.  Described persistent chest pain radiating to his back for the past 2 weeks.  He is also had unintentional weight loss.  CT scan indicated esophageal thickening.  Seen by GI and underwent EGD that indicated esophageal mass.  Further imaging also indicated possible hepatic mass/lesions in the hepatogastric recess.  Biopsies confirmed squamous cell cancer.  Oncology and palliative care have been consulted  Discharge Diagnoses:  Active Problems:   Smoker   Chest pain   Pancreatic mass   Acute respiratory failure with hypoxia (HCC)   Unintentional weight loss   B12 deficiency   Iron deficiency   Abnormal CT scan, esophagus   Mass of esophagus   Mass of esophagus determined by endoscopy   Non-cardiac chest pain   Esophageal mass   Protein-calorie malnutrition, severe  Chest pain. -Ruled out for ACS with negative cardiac markers, also symptoms are atypical for cardiac chest pain since they have been present consistently for 2 weeks -D-dimer is also negative -Suspect his symptoms are more GI related -remained stable   Squamous cell carcinoma of esophagus, new diagnosis -Seen by GI and underwent endoscopy -Biopsies confirm squamous cell cancer -Pt was seen by Dr. Alen Blew, recommendation for continued outpt workup, including PET   Anemia -Multifactorial -Patient noted to have iron deficiency as well as B12  deficiency -Received B12 replacement -1 dose of Feraheme was ordered, but this was discontinued within 5 minutes since he began to develop itching -Overall hemoglobin has been stable   Acute respiratory failure with hypoxia -ED notes indicate that his oxygen saturations dropped to 88% on room air on ambulation -Remained stable on minimal O2 support -D-dimer negative, BNP normal, no evidence of pneumonia on CT scan. -With his long history of tobacco use, I suspect he may have an underlying element of COPD -He will need follow-up with pulmonology for PFTs -Started on bronchodilators and inhaled steroids with subjective improvement   Dysphagia -Reports that he starts coughing immediately after and drink liquids -Esophageal process may be contributing -Seen by speech therapy, underwent MBS with recommendations for soft diet with nectar thick liquids   Elevated blood pressure -Pt was continued on metoprolol, blood pressures have been stable   Back pain -Suspect this may be related to his underlying GI process -Continue pain management -Seen by physical therapy/Occupational Therapy with no follow-up recommended   Severe protein calorie malnutrition -Patient has had significant weight loss related to his malignancy -Nutrition following   Goals of care -With patient's metastatic malignancy, malnutrition his long-term prognosis appears to be poor -Seen by Palliative Care with recommendation for continued outpatient Palliatiev Care f/u    Discharge Instructions  Discharge Instructions     Ambulatory referral to Speech Therapy   Complete by: As directed       Allergies as of 07/20/2020   No Known Allergies      Medication List     TAKE these medications    acetaminophen 500 MG tablet Commonly known as: TYLENOL Take 500 mg by mouth  every 6 (six) hours as needed for mild pain or headache.   food thickener Gel Commonly known as: SIMPLYTHICK (NECTAR/LEVEL 2/MILDLY  THICK) Take 1 packet by mouth as needed.   metoprolol tartrate 25 MG tablet Commonly known as: LOPRESSOR Take 1 tablet (25 mg total) by mouth 2 (two) times daily.   oxyCODONE 5 MG immediate release tablet Commonly known as: Oxy IR/ROXICODONE Take 1 tablet (5 mg total) by mouth every 6 (six) hours as needed for moderate pain or breakthrough pain.        Follow-up Information     Primary Care at [facility]. Go on 08/24/2020.   Specialty: Family Medicine Why: hospital follow up scheduled with Durene Fruits for 1:30pm- please arrive 15 min early and bring med list - if unable to keep appointment please call to re-schedule or cancel within 24hr of appointment. Contact information: [address] [place] [phone]               No Known Allergies  Consultations: Oncology GI Palliative Care  Procedures/Studies: DG Chest 2 View  Result Date: 07/16/2020 CLINICAL DATA:  Left-sided chest pain and back pain EXAM: CHEST - 2 VIEW COMPARISON:  10/20/2014 FINDINGS: The heart size and mediastinal contours are within normal limits. Pulmonary hyperinflation. The visualized skeletal structures are unremarkable. IMPRESSION: Pulmonary hyperinflation without acute abnormality of the lungs. Electronically Signed   By: Eddie Candle M.D.   On: 07/16/2020 20:14   MR ABDOMEN W WO CONTRAST  Result Date: 07/18/2020 CLINICAL DATA:  Suspected liver metastasis in the setting of pancreatic neoplasm. EXAM: MRI ABDOMEN WITHOUT AND WITH CONTRAST TECHNIQUE: Multiplanar multisequence MR imaging of the abdomen was performed both before and after the administration of intravenous contrast. CONTRAST:  30mL GADAVIST GADOBUTROL 1 MMOL/ML IV SOLN COMPARISON:  CT evaluation of July 16, 2020. FINDINGS: Lower chest: Diffuse esophageal thickening seen on recent CT of the chest, abdomen and pelvis is not evaluated. There is no dense consolidation. Small bilateral pleural  effusions are present. Hepatobiliary: Hepatic iron deposition makes liver assessment limited as does the presence of respiratory motion. There are multiple hepatic lesions. Lesions in terms of enhancement are not well evaluated due to the presence of background signal within the vasculature related to recent Feraheme infusion based on appearance. For this reason the pancreas is also not well evaluated on precontrast imaging. The portal vein is narrowed about the pancreas, main portal vein. The SMV appears patent. Narrowing of the portal vein is in the mid portal vein adjacent to the large mass which is centered in the hepatogastric recess more than in the pancreatic head. Hepatic lesions, dominant lesion in the LEFT hepatic lobe measuring 3.4 x 2.8 cm without signal to suggest cyst, subtraction images with enhancement pattern compatible with metastatic lesion on subtraction. Motion degraded study. No pericholecystic stranding.  No biliary duct dilation. The lesion in the anterior LEFT hemi liver, hepatic subsegment II (image 14 of series 19) also likely a metastatic focus. The presence of profound iron deposition in the liver again limiting assessment of these areas. Other areas of diffusion related signal in the hepatic parenchyma at least 4 additional small areas, some in the RIGHT hepatic lobe largest near the IVC confluence on image 78 of series 10 measures 14 mm. Inferior LEFT hepatic lobe lesion seen on previous CT approximately 2 cm (image 19/5) also suspicious for metastasis. Pancreas: Pancreas also showing very low intrinsic "pre contrast" T1 signal compatible with iron deposition likely  related to recent Feraheme administration. Large mass adjacent to or arising from the pancreas in the gastrohepatic region tracking into the hepato gastric recess measuring as much as 5 x 5.2 cm (image 47/10). No pancreatic ductal dilation despite large mass in the region. Spleen:  Spleen normal size and contour, no focal  lesion. Adrenals/Urinary Tract:  Adrenal glands are normal. Renal cysts bilaterally. Stomach/Bowel: No acute gastrointestinal process to the extent evaluated. Esophageal abnormalities not visualized on CT. Vascular/Lymphatic: Portal venous narrowing in the mid portal vein, high-grade narrowing. Signal remains within the portal vein and within the SMV. No adenopathy below the renal veins. Celiac adenopathy and RIGHT juxta crural nodal enlargement behind the inferior vena cava nearly contiguous with large mass region of the gastrohepatic ligament. Other:  Trace ascites. Musculoskeletal: No suspicious bone lesions identified. But signs of diffuse iron deposition. IMPRESSION: Signs of malignancy with mass in the region of the hepatogastric recess near the pancreatic head that narrows vascular structures and is associated with hepatic metastatic disease. Exam limited by recent presumed administration of Feraheme which confounds assessment. 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 Pancreatic neoplasm is felt less likely given the lack of peripheral ductal dilation though assessment is quite limited. Consider upper endoscopy and endoscopic ultrasound evaluation for further assessment. Further staging should be performed with CT for at least 60-90 days given the recent iron infusion. Would defer decision for the type per protocol to be utilized until after the endoscopy. Electronically Signed   By: Zetta Bills M.D.   On: 07/18/2020 07:48   DG Swallowing Func-Speech Pathology  Result Date: 07/20/2020 Formatting of this result is different from the original. Objective Swallowing Evaluation: Type of Study: MBS-Modified Barium Swallow Study  Patient Details Name: Jaykob Minichiello MRN: [id number] Date of Birth: 02/16/1960 Today's Date: 07/20/2020 Time: SLP Start Time (ACUTE ONLY): 0940 -SLP Stop Time (ACUTE ONLY): 1000 SLP Time Calculation (min) (ACUTE ONLY):  20 min Past Medical History: Past Medical History: Diagnosis Date  Cat bite of left hand 05/11/2015  COPD (chronic obstructive pulmonary disease) ([place])   Smoker  Past Surgical History: Past Surgical History: Procedure Laterality Date  APPENDECTOMY    I & D EXTREMITY Left 05/13/2015  Procedure: INCISION AND DRAINAGE OF LEFT EXTREMITY;  Surgeon: Charlotte Crumb, MD;  Location: [place];  Service: Orthopedics;  Laterality: Left;  INCISION AND DRAINAGE Left 05/13/2015 HPI: 60 year old male with a history of tobacco use, admitted to the hospital with complaints of chest discomfort and shortness of breath.  Described persistent chest pain radiating to his back for the past 2 weeks.  He is also had unintentional weight loss, odynophagia, coughing with all POs  CT scan indicated esophageal thickening.  Seen by GI and underwent EGD that indicated esophageal mass.  Further imaging also indicates possible pancreatic mass and hepatic metastases.  Currently biopsies are pending.  Subjective: alert, pleasant upright in chair at bedside Assessment / Plan / Recommendation CHL IP CLINICAL IMPRESSIONS 07/20/2020 Clinical Impression Pt demonstrates moderate oropharyngeal dysphagia. Primary problem is decreased laryngeal closure with aspiration during the swallow. Attempted moderating bolus size, which is helpful with small single swallows. Otherwise chin tuck, breath hold, head turn left and right all ineffective in preventing aspiration. Pt is hoarse, there is concern for abnormality of larynx. Cough is ineffective to clear aspirate and cough appears quite irritating and distressing to pt. Pt tolerated soft solids (edentulous) and nectar thick liquids with only trace penetration. Will  initiate dys 3/nectar. Pt may benefit from ENT to ensure no additional laryngeal abnormality.Pt will need f/u with OP SLP depending on treatment plan with oncology. SLP Visit Diagnosis Dysphagia, oropharyngeal phase (R13.12) Attention and concentration  deficit following -- Frontal lobe and executive function deficit following -- Impact on safety and function Moderate aspiration risk   CHL IP TREATMENT RECOMMENDATION 07/20/2020 Treatment Recommendations Therapy as outlined in treatment plan below   Prognosis 07/20/2020 Prognosis for Safe Diet Advancement Fair Barriers to Reach Goals Severity of deficits Barriers/Prognosis Comment -- CHL IP DIET RECOMMENDATION 07/20/2020 SLP Diet Recommendations Dysphagia 3 (Mech soft) solids;Nectar thick liquid Liquid Administration via Cup;Straw Medication Administration Whole meds with liquid Compensations Slow rate;Small sips/bites;Clear throat intermittently Postural Changes Seated upright at 90 degrees   CHL IP OTHER RECOMMENDATIONS 07/20/2020 Recommended Consults -- Oral Care Recommendations Oral care BID Other Recommendations --   CHL IP FOLLOW UP RECOMMENDATIONS 07/20/2020 Follow up Recommendations Outpatient SLP   CHL IP FREQUENCY AND DURATION 07/20/2020 Speech Therapy Frequency (ACUTE ONLY) min 2x/week Treatment Duration 2 weeks      CHL IP ORAL PHASE 07/20/2020 Oral Phase WFL Oral - Pudding Teaspoon -- Oral - Pudding Cup -- Oral - Honey Teaspoon -- Oral - Honey Cup -- Oral - Nectar Teaspoon -- Oral - Nectar Cup -- Oral - Nectar Straw -- Oral - Thin Teaspoon -- Oral - Thin Cup -- Oral - Thin Straw -- Oral - Puree -- Oral - Mech Soft -- Oral - Regular -- Oral - Multi-Consistency -- Oral - Pill -- Oral Phase - Comment --  CHL IP PHARYNGEAL PHASE 07/20/2020 Pharyngeal Phase Impaired Pharyngeal- Pudding Teaspoon -- Pharyngeal -- Pharyngeal- Pudding Cup -- Pharyngeal -- Pharyngeal- Honey Teaspoon -- Pharyngeal -- Pharyngeal- Honey Cup -- Pharyngeal -- Pharyngeal- Nectar Teaspoon -- Pharyngeal -- Pharyngeal- Nectar Cup Reduced airway/laryngeal closure;Penetration/Aspiration during swallow Pharyngeal Material enters airway, CONTACTS cords and not ejected out;Material does not enter airway Pharyngeal- Nectar Straw -- Pharyngeal --  Pharyngeal- Thin Teaspoon -- Pharyngeal -- Pharyngeal- Thin Cup Penetration/Aspiration during swallow;Reduced airway/laryngeal closure Pharyngeal Material enters airway, passes BELOW cords and not ejected out despite cough attempt by patient;Material enters airway, CONTACTS cords and not ejected out Pharyngeal- Thin Straw Reduced airway/laryngeal closure;Penetration/Aspiration during swallow Pharyngeal Material enters airway, CONTACTS cords and not ejected out;Material enters airway, passes BELOW cords and not ejected out despite cough attempt by patient Pharyngeal- Puree WFL Pharyngeal -- Pharyngeal- Mechanical Soft WFL Pharyngeal -- Pharyngeal- Regular -- Pharyngeal -- Pharyngeal- Multi-consistency -- Pharyngeal -- Pharyngeal- Pill WFL Pharyngeal -- Pharyngeal Comment --  No flowsheet data found. DeBlois, Katherene Ponto 07/20/2020, 10:39 AM              CT Angio Chest/Abd/Pel for Dissection W and/or Wo Contrast  Result Date: 07/16/2020 CLINICAL DATA:  Abdominal pain, aortic dissection suspected EXAM: CT ANGIOGRAPHY CHEST, ABDOMEN AND PELVIS TECHNIQUE: Non-contrast CT of the chest was initially obtained. Multidetector CT imaging through the chest, abdomen and pelvis was performed using the standard protocol during bolus administration of intravenous contrast. Multiplanar reconstructed images and MIPs were obtained and reviewed to evaluate the vascular anatomy. CONTRAST:  120mL OMNIPAQUE IOHEXOL 350 MG/ML SOLN COMPARISON:  CT angiography chest 10/20/2014 FINDINGS: CTA CHEST FINDINGS Cardiovascular: Satisfactory opacification of the pulmonary arteries to the segmental level. No evidence of pulmonary embolism. Normal heart size. No pericardial effusion. Mediastinum/Nodes: Limited evaluation for mediastinal lymphadenopathy due to timing of contrast. Question subcarinal lymph node. No enlarged hilar or axillary lymph nodes. Thyroid gland and trachea demonstrate  no significant findings. Marked diffuse esophageal wall  thickening and haziness. Associated posterior mediastinal haziness and soft tissue density. Soft tissue density in haziness abuts the posterior trachea and carina. Lungs/Pleura: No pulmonary nodule. No pulmonary mass. Thin consolidation along the pleura within the posterior right middle lobe. No pleural effusion. No pneumothorax. Musculoskeletal: No chest wall abnormality. No suspicious lytic or blastic osseous lesions. No acute displaced fracture. Multilevel degenerative changes of the spine. Review of the MIP images confirms the above findings. CTA ABDOMEN AND PELVIS FINDINGS VASCULAR Aorta: At least moderate atherosclerotic plaque. Normal caliber aorta without aneurysm, dissection, vasculitis or significant stenosis. Celiac: Mild atherosclerotic plaque. Patent without evidence of aneurysm, dissection, vasculitis or significant stenosis. SMA: Mild atherosclerotic plaque. Patent without evidence of aneurysm, dissection, vasculitis or significant stenosis. Renals: Both renal arteries are patent without evidence of aneurysm, dissection, vasculitis, fibromuscular dysplasia or significant stenosis. IMA: Patent without evidence of aneurysm, dissection, vasculitis or significant stenosis. Inflow: At least moderate atherosclerotic plaque. Patent without evidence of aneurysm, dissection, vasculitis or significant stenosis. Veins: No obvious venous abnormality within the limitations of this arterial phase study. Review of the MIP images confirms the above findings. NON-VASCULAR Hepatobiliary: There is a 1.5 cm hypodensity within the left hepatic lobe (7:128) as well as a poorly defined 3.1 cm hypodensity within the inferior left lateral hepatic lobe (7:168). No gallstones, gallbladder wall thickening, or pericholecystic fluid. No biliary dilatation. Pancreas: Question 1.6 cm proximal pancreatic mass with markedly limited evaluation due to timing of contrast. Spleen: Normal in size without focal abnormality. Adrenals/Urinary  Tract: No adrenal nodule bilaterally. Bilateral kidneys enhance symmetrically. There is a 1.3 cm fluid density lesion within the left kidney that likely represents a simple renal cyst. Subcentimeter hypodensities are too small to characterize. No hydronephrosis. No hydroureter. The urinary bladder is unremarkable. Stomach/Bowel: Stomach is within normal limits. No evidence of bowel wall thickening or dilatation. Lymphatic: No definite lymphadenopathy. Reproductive: Prostate is unremarkable. Other: No intraperitoneal free fluid. No intraperitoneal free gas. No organized fluid collection. Musculoskeletal: No abdominal wall hernia or abnormality. No suspicious lytic or blastic osseous lesions. No acute displaced fracture. Multilevel degenerative changes of the spine. Review of the MIP images confirms the above findings. IMPRESSION: 1. No acute vascular abnormality. 2. Marked diffuse esophageal wall thickening and haziness as well as surrounding posterior mediastinal haziness/fat stranding. Findings concerning for malignancy versus inflammation. Recommend direct visualization. 3. Mediastinal lymphadenopathy. 4. Question 1.6 cm proximal pancreatic mass with markedly limited evaluation due to timing of contrast. Concern for malignancy. Recommend MRI pancreatic protocol for further evaluation. 5. Indeterminate 1.5 cm as well as another 3.1 cm poorly defined left hepatic lobe hypodensity. These finding can be further evaluated on MRI pancreatic protocol. Electronically Signed   By: Iven Finn M.D.   On: 07/16/2020 23:42    Subjective: Eager to go home  Discharge Exam: Vitals:   07/20/20 0410 07/20/20 1101  BP:  (!) 152/85  Pulse: 96 94  Resp:    Temp:    SpO2: 93%    Vitals:   07/19/20 2202 07/19/20 2210 07/20/20 0410 07/20/20 1101  BP: (!) 143/84   (!) 152/85  Pulse: 89 90 96 94  Resp:      Temp:      TempSrc:      SpO2:  94% 93%   Weight:      Height:        General: Pt is alert, awake, not  in acute distress Cardiovascular: RRR, S1/S2  Respiratory: CTA  bilaterally, no wheezing, no rhonchi Abdominal: Soft, NT, ND, bowel sounds + Extremities: no edema, no cyanosis   The results of significant diagnostics from this hospitalization (including imaging, microbiology, ancillary and laboratory) are listed below for reference.     Microbiology: Recent Results (from the past 240 hour(s))  SARS CORONAVIRUS 2 (TAT 6-24 HRS) Nasopharyngeal Nasopharyngeal Swab     Status: None   Collection Time: 07/17/20  1:13 AM   Specimen: Nasopharyngeal Swab  Result Value Ref Range Status   SARS Coronavirus 2 NEGATIVE NEGATIVE Final    Comment: (NOTE) SARS-CoV-2 target nucleic acids are NOT DETECTED.  The SARS-CoV-2 RNA is generally detectable in upper and lower respiratory specimens during the acute phase of infection. Negative results do not preclude SARS-CoV-2 infection, do not rule out co-infections with other pathogens, and should not be used as the sole basis for treatment or other patient management decisions. Negative results must be combined with clinical observations, patient history, and epidemiological information. The expected result is Negative.  Fact Sheet for Patients: SugarRoll.be  Fact Sheet for Healthcare Providers: https://www.woods-mathews.com/  This test is not yet approved or cleared by the [place] FDA and  has been authorized for detection and/or diagnosis of SARS-CoV-2 by FDA under an Emergency Use Authorization (EUA). This EUA will remain  in effect (meaning this test can be used) for the duration of the COVID-19 declaration under Se ction 564(b)(1) of the Act, 21 U.S.C. section 360bbb-3(b)(1), unless the authorization is terminated or revoked sooner.  Performed at [facility], [place] [address], [place], [place] [address]      Labs: BNP (last 3 results) Recent Labs    07/16/20 1855  BNP 32.4   Basic  Metabolic Panel: Recent Labs  Lab 07/16/20 1855 07/17/20 0437 07/18/20 0305  NA 136 137 137  K 3.8 4.0 4.6  CL 101 101 99  CO2 27 24 27   GLUCOSE 109* 99 111*  BUN 13 11 14   CREATININE 1.22 1.03 1.14  CALCIUM 9.1 9.0 9.4  MG  --  1.8  --   PHOS  --  3.7  --    Liver Function Tests: Recent Labs  Lab 07/16/20 1855 07/17/20 0437  AST <5* 10*  ALT 9 8  ALKPHOS 53 50  BILITOT 1.1 1.0  PROT 7.0 6.5  ALBUMIN 3.6 3.3*   Recent Labs  Lab 07/17/20 0437  LIPASE 21   No results for input(s): AMMONIA in the last 168 hours. CBC: Recent Labs  Lab 07/16/20 1855 07/17/20 0437 07/18/20 0305  WBC 8.6 9.0 8.5  NEUTROABS 7.0  --   --   HGB 11.7* 11.3* 12.6*  HCT 35.0* 34.0* 38.2*  MCV 79.4* 78.9* 78.8*  PLT 163 163 165   Cardiac Enzymes: No results for input(s): CKTOTAL, CKMB, CKMBINDEX, TROPONINI in the last 168 hours. BNP: Invalid input(s): POCBNP CBG: No results for input(s): GLUCAP in the last 168 hours. D-Dimer No results for input(s): DDIMER in the last 72 hours. Hgb A1c No results for input(s): HGBA1C in the last 72 hours. Lipid Profile No results for input(s): CHOL, HDL, LDLCALC, TRIG, CHOLHDL, LDLDIRECT in the last 72 hours. Thyroid function studies No results for input(s): TSH, T4TOTAL, T3FREE, THYROIDAB in the last 72 hours.  Invalid input(s): FREET3 Anemia work up No results for input(s): VITAMINB12, FOLATE, FERRITIN, TIBC, IRON, RETICCTPCT in the last 72 hours. Urinalysis    Component Value Date/Time   COLORURINE YELLOW 10/20/2014 1838   APPEARANCEUR CLEAR 10/20/2014 1838  LABSPEC 1.017 10/20/2014 1838   PHURINE 5.0 10/20/2014 1838   GLUCOSEU NEGATIVE 10/20/2014 1838   HGBUR MODERATE (A) 10/20/2014 1838   BILIRUBINUR NEGATIVE 10/20/2014 1838   KETONESUR 15 (A) 10/20/2014 1838   PROTEINUR >300 (A) 10/20/2014 1838   UROBILINOGEN 1.0 10/20/2014 1838   NITRITE NEGATIVE 10/20/2014 1838   LEUKOCYTESUR NEGATIVE 10/20/2014 1838   Sepsis Labs Invalid  input(s): PROCALCITONIN,  WBC,  LACTICIDVEN Microbiology Recent Results (from the past 240 hour(s))  SARS CORONAVIRUS 2 (TAT 6-24 HRS) Nasopharyngeal Nasopharyngeal Swab     Status: None   Collection Time: 07/17/20  1:13 AM   Specimen: Nasopharyngeal Swab  Result Value Ref Range Status   SARS Coronavirus 2 NEGATIVE NEGATIVE Final    Comment: (NOTE) SARS-CoV-2 target nucleic acids are NOT DETECTED.  The SARS-CoV-2 RNA is generally detectable in upper and lower respiratory specimens during the acute phase of infection. Negative results do not preclude SARS-CoV-2 infection, do not rule out co-infections with other pathogens, and should not be used as the sole basis for treatment or other patient management decisions. Negative results must be combined with clinical observations, patient history, and epidemiological information. The expected result is Negative.  Fact Sheet for Patients: SugarRoll.be  Fact Sheet for Healthcare Providers: [email or web address]-[email or web address]  This test is not yet approved or cleared by the [place] FDA and  has been authorized for detection and/or diagnosis of SARS-CoV-2 by FDA under an Emergency Use Authorization (EUA). This EUA will remain  in effect (meaning this test can be used) for the duration of the COVID-19 declaration under Se ction 564(b)(1) of the Act, 21 U.S.C. section 360bbb-3(b)(1), unless the authorization is terminated or revoked sooner.  Performed at [facility], [place] [address] [place], [place] [address]    Time spent: 30 min  SIGNED:   Marylu Lund, MD  [facility] 07/20/2020, 12:45 PM  If 7PM-7AM, please contact night-coverage

## 2020-07-21 ENCOUNTER — Other Ambulatory Visit: Payer: Self-pay | Admitting: Oncology

## 2020-07-21 ENCOUNTER — Encounter (HOSPITAL_COMMUNITY): Payer: Self-pay | Admitting: Gastroenterology

## 2020-07-21 ENCOUNTER — Telehealth: Payer: Self-pay | Admitting: Hematology and Oncology

## 2020-07-21 ENCOUNTER — Other Ambulatory Visit: Payer: Self-pay

## 2020-07-21 ENCOUNTER — Telehealth: Payer: Self-pay | Admitting: Radiation Oncology

## 2020-07-21 DIAGNOSIS — K2289 Other specified disease of esophagus: Secondary | ICD-10-CM

## 2020-07-21 DIAGNOSIS — C159 Malignant neoplasm of esophagus, unspecified: Secondary | ICD-10-CM

## 2020-07-21 NOTE — Telephone Encounter (Signed)
Christian Newton has been scheduled to see Dr. Lorenso Courier on 7/7 at [facility] for an esophageal mass. I returned Christian Newton's call from [facility], to inform him of the appt date and time.

## 2020-07-27 ENCOUNTER — Encounter (HOSPITAL_COMMUNITY): Payer: Self-pay | Admitting: Gastroenterology

## 2020-07-27 NOTE — Progress Notes (Signed)
GI Location of Tumor / Histology: Esophagus  Kuba Shepherd states he started having chest pain, left flank/back pain, and difficulty swallowing about 2 months ago.  MRI Abdomen 07/17/2020: Signs of malignancy with mass in the region of the hepatogastric recess near the pancreatic head that narrows vascular structures and is associated with hepatic metastatic disease.  Exam limited by recent presumed administration of Feraheme which confounds assessment. 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  Biopsies of Esophagus Mass 07/18/2020   Past/Anticipated interventions by surgeon, if any:   Past/Anticipated interventions by medical oncology, if any:  Dr. 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 -The imaging results were reportedly difficult to interpret with prior administration of feraheme, therefore I do believe it would be reasonable to order a PET CT in order to confirm the presence of metastatic lesions. -Treatment of choice for this patient would be palliative radiation therapy to the esophagus followed by systemic chemotherapy -Recommend PEG tube placement   Weight changes, if any: Lost about 20 pounds in the last month.  Bowel/Bladder complaints, if any: No  Nausea / Vomiting, if any: Occasional if he doesn't get his food in small pieces.  Pain issues, if any: Has pain in his chest and left flank/back  Denies heartburn and indigestion.  Appetite: Decreased due to swallowing.  He is eating mostly soft foods, breaking into very small pieces.  He uses simply thick to help the foods go down.  SAFETY ISSUES: Prior radiation? No Pacemaker/ICD? No Possible current pregnancy? N/a Is the patient on methotrexate? No  Current  Complaints/Details:

## 2020-07-27 NOTE — Anesthesia Postprocedure Evaluation (Signed)
Anesthesia Post Note  Patient: Christian Newton  Procedure(s) Performed: ESOPHAGOGASTRODUODENOSCOPY (EGD) WITH PROPOFOL BIOPSY     Patient location during evaluation: Endoscopy Anesthesia Type: General Level of consciousness: awake and alert Pain management: pain level controlled Vital Signs Assessment: post-procedure vital signs reviewed and stable Respiratory status: spontaneous breathing, nonlabored ventilation, respiratory function stable and patient connected to nasal cannula oxygen Cardiovascular status: stable and blood pressure returned to baseline Postop Assessment: no apparent nausea or vomiting Anesthetic complications: no   No notable events documented. Vitals reviewed and stable          Marketia Stallsmith

## 2020-07-27 NOTE — Anesthesia Preprocedure Evaluation (Signed)
Anesthesia Evaluation  Patient identified by MRN, date of birth, ID band Patient awake    Reviewed: Allergy & Precautions, NPO status , Patient's Chart, lab work & pertinent test results  Airway Mallampati: II  TM Distance: >3 FB Neck ROM: Full    Dental  (+) Dental Advisory Given, Edentulous Upper, Edentulous Lower   Pulmonary COPD, Current Smoker and Patient abstained from smoking.,    breath sounds clear to auscultation       Cardiovascular (-) angina(-) CHF  Rhythm:Regular Rate:Normal     Neuro/Psych negative neurological ROS     GI/Hepatic Neg liver ROS,  dysphagia, wt loss, Chest pain, abnormal CT   Endo/Other  negative endocrine ROS  Renal/GU negative Renal ROS     Musculoskeletal negative musculoskeletal ROS (+)   Abdominal   Peds  Hematology negative hematology ROS (+)   Anesthesia Other Findings Day of surgery medications reviewed with the patient.  Reproductive/Obstetrics                             Anesthesia Physical Anesthesia Plan  ASA: 2  Anesthesia Plan: MAC   Post-op Pain Management:    Induction: Intravenous  PONV Risk Score and Plan: 0 and Propofol infusion and Treatment may vary due to age or medical condition  Airway Management Planned: Nasal Cannula  Additional Equipment: None  Intra-op Plan:   Post-operative Plan:   Informed Consent: I have reviewed the patients History and Physical, chart, labs and discussed the procedure including the risks, benefits and alternatives for the proposed anesthesia with the patient or authorized representative who has indicated his/her understanding and acceptance.     Dental advisory given  Plan Discussed with: CRNA  Anesthesia Plan Comments:         Anesthesia Quick Evaluation

## 2020-07-28 ENCOUNTER — Encounter: Payer: Self-pay | Admitting: Radiation Oncology

## 2020-07-28 ENCOUNTER — Ambulatory Visit
Admission: RE | Admit: 2020-07-28 | Discharge: 2020-07-28 | Disposition: A | Discharge status: Home / Self Care | Payer: Medicaid Other | Source: Ambulatory Visit | Attending: Radiation Oncology | Admitting: Radiation Oncology

## 2020-07-28 ENCOUNTER — Telehealth: Payer: Self-pay | Admitting: Dietician

## 2020-07-28 ENCOUNTER — Inpatient Hospital Stay: Payer: Medicaid Other | Attending: Hematology and Oncology | Admitting: Hematology and Oncology

## 2020-07-28 ENCOUNTER — Inpatient Hospital Stay: Payer: Medicaid Other

## 2020-07-28 ENCOUNTER — Other Ambulatory Visit: Payer: Self-pay

## 2020-07-28 ENCOUNTER — Encounter: Payer: Self-pay | Admitting: Licensed Clinical Social Worker

## 2020-07-28 ENCOUNTER — Other Ambulatory Visit (HOSPITAL_COMMUNITY): Payer: Self-pay

## 2020-07-28 VITALS — BP 124/83 | HR 83 | Temp 98.9°F | Resp 17 | Ht 68.0 in | Wt 118.1 lb

## 2020-07-28 VITALS — BP 108/76 | HR 88 | Temp 97.8°F | Resp 20 | Ht 68.0 in | Wt 117.6 lb

## 2020-07-28 DIAGNOSIS — G893 Neoplasm related pain (acute) (chronic): Secondary | ICD-10-CM | POA: Insufficient documentation

## 2020-07-28 DIAGNOSIS — C154 Malignant neoplasm of middle third of esophagus: Secondary | ICD-10-CM | POA: Insufficient documentation

## 2020-07-28 DIAGNOSIS — Z79899 Other long term (current) drug therapy: Secondary | ICD-10-CM | POA: Insufficient documentation

## 2020-07-28 DIAGNOSIS — F17210 Nicotine dependence, cigarettes, uncomplicated: Secondary | ICD-10-CM | POA: Insufficient documentation

## 2020-07-28 DIAGNOSIS — C159 Malignant neoplasm of esophagus, unspecified: Secondary | ICD-10-CM

## 2020-07-28 DIAGNOSIS — J449 Chronic obstructive pulmonary disease, unspecified: Secondary | ICD-10-CM | POA: Diagnosis not present

## 2020-07-28 DIAGNOSIS — R634 Abnormal weight loss: Secondary | ICD-10-CM | POA: Insufficient documentation

## 2020-07-28 DIAGNOSIS — C787 Secondary malignant neoplasm of liver and intrahepatic bile duct: Secondary | ICD-10-CM | POA: Insufficient documentation

## 2020-07-28 LAB — CBC WITH DIFFERENTIAL (CANCER CENTER ONLY)
Abs Immature Granulocytes: 0.03 10*3/uL (ref 0.00–0.07)
Basophils Absolute: 0 10*3/uL (ref 0.0–0.1)
Basophils Relative: 1 %
Eosinophils Absolute: 0.1 10*3/uL (ref 0.0–0.5)
Eosinophils Relative: 1 %
HCT: 37.6 % — ABNORMAL LOW (ref 39.0–52.0)
Hemoglobin: 12.6 g/dL — ABNORMAL LOW (ref 13.0–17.0)
Immature Granulocytes: 0 %
Lymphocytes Relative: 18 %
Lymphs Abs: 1.5 10*3/uL (ref 0.7–4.0)
MCH: 26.1 pg (ref 26.0–34.0)
MCHC: 33.5 g/dL (ref 30.0–36.0)
MCV: 77.8 fL — ABNORMAL LOW (ref 80.0–100.0)
Monocytes Absolute: 0.5 10*3/uL (ref 0.1–1.0)
Monocytes Relative: 6 %
Neutro Abs: 6 10*3/uL (ref 1.7–7.7)
Neutrophils Relative %: 74 %
Platelet Count: 362 10*3/uL (ref 150–400)
RBC: 4.83 MIL/uL (ref 4.22–5.81)
RDW: 13.1 % (ref 11.5–15.5)
WBC Count: 8.1 10*3/uL (ref 4.0–10.5)
nRBC: 0 % (ref 0.0–0.2)

## 2020-07-28 LAB — CMP (CANCER CENTER ONLY)
ALT: 9 U/L (ref 0–44)
AST: 10 U/L — ABNORMAL LOW (ref 15–41)
Albumin: 3.5 g/dL (ref 3.5–5.0)
Alkaline Phosphatase: 60 U/L (ref 38–126)
Anion gap: 10 (ref 5–15)
BUN: 17 mg/dL (ref 8–23)
CO2: 26 mmol/L (ref 22–32)
Calcium: 9.9 mg/dL (ref 8.9–10.3)
Chloride: 101 mmol/L (ref 98–111)
Creatinine: 1.49 mg/dL — ABNORMAL HIGH (ref 0.61–1.24)
GFR, Estimated: 53 mL/min — ABNORMAL LOW (ref >60)
Glucose, Bld: 107 mg/dL — ABNORMAL HIGH (ref 70–99)
Potassium: 5 mmol/L (ref 3.5–5.1)
Sodium: 137 mmol/L (ref 135–145)
Total Bilirubin: 0.7 mg/dL (ref 0.3–1.2)
Total Protein: 7.7 g/dL (ref 6.5–8.1)

## 2020-07-28 MED ORDER — METOPROLOL TARTRATE 25 MG PO TABS: 25.0000 mg | ORAL_TABLET | Freq: Two times a day (BID) | ORAL | 0 refills | Status: DC

## 2020-07-28 MED ORDER — OXYCODONE HCL 5 MG PO TABS: 5.0000 mg | ORAL_TABLET | Freq: Four times a day (QID) | ORAL | 0 refills | Status: DC | PRN

## 2020-07-28 MED ORDER — METOPROLOL TARTRATE 25 MG PO TABS
25.0000 mg | ORAL_TABLET | Freq: Two times a day (BID) | ORAL | 0 refills | Status: DC
  Filled 2020-07-28: qty 60, 30d supply, fill #0

## 2020-07-28 MED ORDER — OXYCODONE HCL 5 MG PO TABS
5.0000 mg | ORAL_TABLET | Freq: Four times a day (QID) | ORAL | 0 refills | Status: DC | PRN
  Filled 2020-07-28: qty 60, 15d supply, fill #0

## 2020-07-28 NOTE — Progress Notes (Signed)
[place] Work  Clinical Social Work was referred by Therapist, sports for assessment of psychosocial needs.  Clinical Social Worker met with patient  to offer support and assess for needs.    Patient is overwhelmed right now after being diagnosed inpatient. He is involved with [employer] and is living in a home with 3 others. He receives assistance with food and rides from this organization.    Pt is interested in applying for Medicaid and disability.  CSW sent message to inpt team to request referral to [facility] for Medicaid referral assistance.  CSW submitted [facility] referral today with pt's permission.  CSW also reviewed other support services, provided calendar of [facility] programs, and gave direct contact information for primary CSWs ([place]/ [place]).    [place], [place] Worker [employer]

## 2020-07-28 NOTE — Progress Notes (Signed)
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 CT angio chest showed diffuse esophageal wall thickening and haziness as well as surrounding posterior mediastinal haziness/fat stranding. Findings concerning for malignancy 07/17/2020: MRI abdomen showed signs of malignancy with mass in the region of the hepatogastric recess near the pancreatic head that narrows vascular structures and is associated with hepatic metastatic disease 07/18/2020: EGD showed a large ulcerating mass with friable mucosa and bleeding on contact in the middle 1/3 of the esophagus. Biopsy was obtained, results consistent with squamous cell carcinoma.  07/28/2020: establish care with Dr. Lorenso Courier   CHIEF COMPLAINTS/PURPOSE OF CONSULTATION:  " Squamous Cell Cancer of the Esophagus "  HISTORY OF PRESENTING ILLNESS:  Christian Newton 60 y.o. male with medical history significant for COPD who presents for evaluation of metastatic squamous cell cancer of the esophagus  On review of the previous records Christian Newton initially presented the emergency department on 07/16/2020 with abdominal pain and concern for aortic dissection.  CT angio was performed which showed diffuse esophageal wall thickening and haziness as well as surrounding posterior mediastinal haziness and fat stranding.  There was also some concern for possible metastatic spread to the pancreas and liver.  An MRI was performed on 07/17/2020 which showed signs of malignancy with mass in the region of the hepatic gastric recess near the pancreatic head that narrows the vascular structures and associated with hepatic  metastatic disease.  An EGD was performed on 07/18/2020 which showed a large ulcerating mass.  Biopsy confirmed squamous cell carcinoma.  Due to concern for these findings the patient was referred to Oncology for further evaluation management.  On exam today Christian Newton reports that he is lost 40 pounds in the last 3 months.  He notes that he is still able to swallow most of his food if he breaks it into small enough pieces.  He does not currently have any dietary restrictions as a result of his dysphagia.  He notes he has a pain in his chest which radiates to his back.  Fortunately he denies any nausea, vomiting, or diarrhea.  He notes that his bowel movements tend to be light-colored and are not black or tarry.  He notes he has not had a bowel movement for the last 2 days.  His pain is currently 7-10 in severity which he has been taking Tylenol and oxycodone.  On further discussion he notes that he is currently an active smoker and smokes about 2 packs/week.  He quit drinking alcohol about 7 years ago.  He reports that his father had a cancer but he is unsure what kind.  His mother has passed away as well.  He has no children and has a spouse he has been separated from for 14 years.  He otherwise denies any fevers, chills, sweats, nausea, ming or diarrhea.  A full 10 point ROS is listed below.  MEDICAL HISTORY:  Past Medical History:  Diagnosis Date   Cat bite of left hand 05/11/2015   COPD (chronic obstructive pulmonary disease) ([place])    Malignant neoplasm of middle third of esophagus ([place]) 07/31/2020   Smoker     SURGICAL HISTORY: Past Surgical History:  Procedure Laterality Date   APPENDECTOMY     BIOPSY  07/18/2020   Procedure: BIOPSY;  Surgeon: Mauri Pole, MD;  Location: [facility];  Service: Endoscopy;;   ESOPHAGOGASTRODUODENOSCOPY (EGD) WITH PROPOFOL N/A 07/18/2020   Procedure: ESOPHAGOGASTRODUODENOSCOPY (EGD) WITH PROPOFOL;  Surgeon: Mauri Pole, MD;  Location: [facility];  Service: Endoscopy;  Laterality: N/A;   I & D EXTREMITY Left 05/13/2015   Procedure: INCISION AND DRAINAGE OF LEFT EXTREMITY;  Surgeon: Charlotte Crumb, MD;  Location: [facility];  Service: Orthopedics;  Laterality: Left;   INCISION AND DRAINAGE Left 05/13/2015    SOCIAL HISTORY: Social History   Socioeconomic History   Marital status: Single    Spouse name: Not on file   Number of children: Not on file   Years of education: Not on file   Highest education level: Not on file  Occupational History   Not on file  Tobacco Use   Smoking status: Every Day    Packs/day: 0.25    Years: 20.00    Pack years: 5.00    Types: Cigarettes   Smokeless tobacco: Never  Vaping Use   Vaping Use: Never used  Substance and Sexual Activity   Alcohol use: No   Drug use: No   Sexual activity: Not on file  Other Topics Concern   Not on file  Social History Narrative   Not on file   Social Determinants of Health   Financial Resource Strain: Not on file  Food Insecurity: Not on file  Transportation Needs: Not on file  Physical Activity: Not on file  Stress: Not on file  Social Connections: Not on file  Intimate Partner Violence: Not on file    FAMILY HISTORY: History reviewed. No pertinent family history.  ALLERGIES:  has No Known Allergies.  MEDICATIONS:  Current Outpatient Medications  Medication Sig Dispense Refill   acetaminophen (TYLENOL) 500 MG tablet Take 500 mg by mouth every 6 (six) hours as needed for mild pain or headache.     dexamethasone (DECADRON) 4 MG tablet Take 1 tablet (4 mg total) by mouth 2 (two) times daily. 60 tablet 0   food thickener (SIMPLYTHICK, NECTAR/LEVEL 2/MILDLY THICK,) GEL Take 1 packet by mouth as needed. 30 packet 0   metoprolol tartrate (LOPRESSOR) 25 MG tablet Take 1 tablet (25 mg total) by mouth 2 (two) times daily. 60 tablet 0   oxyCODONE (OXY IR/ROXICODONE) 5 MG immediate release tablet Take 1 tablet (5 mg total) by mouth every 6 (six)  hours as needed for moderate pain or breakthrough pain. 60 tablet 0   pantoprazole (PROTONIX) 40 MG tablet Take 1 tablet (40 mg total) by mouth daily. 30 tablet 1   No current facility-administered medications for this visit.    REVIEW OF SYSTEMS:   Constitutional: ( - ) fevers, ( - )  chills , ( - ) night sweats Eyes: ( - ) blurriness of vision, ( - ) double vision, ( - ) watery eyes Ears, nose, mouth, throat, and face: ( - ) mucositis, ( - ) sore throat Respiratory: ( - ) cough, ( - ) dyspnea, ( - ) wheezes Cardiovascular: ( - ) palpitation, ( - ) chest discomfort, ( - ) lower extremity swelling Gastrointestinal:  ( - ) nausea, ( - ) heartburn, ( - ) change in bowel habits Skin: ( - ) abnormal skin rashes Lymphatics: ( - ) new lymphadenopathy, ( - ) easy bruising Neurological: ( - ) numbness, ( - ) tingling, ( - )  new weaknesses Behavioral/Psych: ( - ) mood change, ( - ) new changes  All other systems were reviewed with the patient and are negative.  PHYSICAL EXAMINATION: ECOG PERFORMANCE STATUS: 1 - Symptomatic but completely ambulatory  Vitals:   07/28/20 0906  BP: 124/83  Pulse: 83  Resp: 17  Temp: 98.9 F (37.2 C)  SpO2: 100%   Filed Weights   07/28/20 0906  Weight: 118 lb 1.6 oz (53.6 kg)    GENERAL: well appearing middle aged [place] American male in NAD  SKIN: skin color, texture, turgor are normal, no rashes or significant lesions EYES: conjunctiva are pink and non-injected, sclera clear LUNGS: clear to auscultation and percussion with normal breathing effort HEART: regular rate & rhythm and no murmurs and no lower extremity edema PSYCH: alert & oriented x 3, fluent speech NEURO: no focal motor/sensory deficits  LABORATORY DATA:  I have reviewed the data as listed CBC Latest Ref Rng & Units 07/28/2020 07/18/2020 07/17/2020  WBC 4.0 - 10.5 K/uL 8.1 8.5 9.0  Hemoglobin 13.0 - 17.0 g/dL 12.6(L) 12.6(L) 11.3(L)  Hematocrit 39.0 - 52.0 % 37.6(L) 38.2(L) 34.0(L)   Platelets 150 - 400 K/uL 362 165 163    CMP Latest Ref Rng & Units 07/28/2020 07/18/2020 07/17/2020  Glucose 70 - 99 mg/dL 107(H) 111(H) 99  BUN 8 - 23 mg/dL 17 14 11   Creatinine 0.61 - 1.24 mg/dL 1.49(H) 1.14 1.03  Sodium 135 - 145 mmol/L 137 137 137  Potassium 3.5 - 5.1 mmol/L 5.0 4.6 4.0  Chloride 98 - 111 mmol/L 101 99 101  CO2 22 - 32 mmol/L 26 27 24   Calcium 8.9 - 10.3 mg/dL 9.9 9.4 9.0  Total Protein 6.5 - 8.1 g/dL 7.7 - 6.5  Total Bilirubin 0.3 - 1.2 mg/dL 0.7 - 1.0  Alkaline Phos 38 - 126 U/L 60 - 50  AST 15 - 41 U/L 10(L) - 10(L)  ALT 0 - 44 U/L 9 - 8    PATHOLOGY:  SURGICAL PATHOLOGY  CASE: [id number]  PATIENT: [facility]  Surgical Pathology Report   Clinical History: dysphagia, weight loss, chest pain, abnormal CT, R/O  cancer (cm)   FINAL MICROSCOPIC DIAGNOSIS:   A. ESOPHAGUS MASS, BIOPSY:  - Squamous cell carcinoma.  - See comment.   COMMENT:  Dr. Vic Ripper agrees.   Call to Dr. Silverio Decamp on 07/19/2020.   GROSS DESCRIPTION:   Received in formalin are pink-white to dark red soft tissue fragments  that are submitted in toto. Number: 7.  Size: 0.1 to 0.4 cm.  Blocks: 1   SW 07/18/2020   Final Diagnosis performed by Claudette Laws, MD.   Electronically signed  07/19/2020    RADIOGRAPHIC STUDIES: I have personally reviewed the radiological images as listed and agreed with the findings in the report: liver masses concerning for metastatic disease.  DG Chest 2 View  Result Date: 07/16/2020 CLINICAL DATA:  Left-sided chest pain and back pain EXAM: CHEST - 2 VIEW COMPARISON:  10/20/2014 FINDINGS: The heart size and mediastinal contours are within normal limits. Pulmonary hyperinflation. The visualized skeletal structures are unremarkable. IMPRESSION: Pulmonary hyperinflation without acute abnormality of the lungs. Electronically Signed   By: Eddie Candle M.D.   On: 07/16/2020 20:14   MR ABDOMEN W WO CONTRAST  Result Date: 07/18/2020 CLINICAL DATA:   Suspected liver metastasis in the setting of pancreatic neoplasm. EXAM: MRI ABDOMEN WITHOUT AND WITH CONTRAST TECHNIQUE: Multiplanar multisequence MR imaging of the abdomen was performed both before and after the administration  of intravenous contrast. CONTRAST:  43m GADAVIST GADOBUTROL 1 MMOL/ML IV SOLN COMPARISON:  CT evaluation of July 16, 2020. FINDINGS: Lower chest: Diffuse esophageal thickening seen on recent CT of the chest, abdomen and pelvis is not evaluated. There is no dense consolidation. Small bilateral pleural effusions are present. Hepatobiliary: Hepatic iron deposition makes liver assessment limited as does the presence of respiratory motion. There are multiple hepatic lesions. Lesions in terms of enhancement are not well evaluated due to the presence of background signal within the vasculature related to recent Feraheme infusion based on appearance. For this reason the pancreas is also not well evaluated on precontrast imaging. The portal vein is narrowed about the pancreas, main portal vein. The SMV appears patent. Narrowing of the portal vein is in the mid portal vein adjacent to the large mass which is centered in the hepatogastric recess more than in the pancreatic head. Hepatic lesions, dominant lesion in the LEFT hepatic lobe measuring 3.4 x 2.8 cm without signal to suggest cyst, subtraction images with enhancement pattern compatible with metastatic lesion on subtraction. Motion degraded study. No pericholecystic stranding.  No biliary duct dilation. The lesion in the anterior LEFT hemi liver, hepatic subsegment II (image 14 of series 19) also likely a metastatic focus. The presence of profound iron deposition in the liver again limiting assessment of these areas. Other areas of diffusion related signal in the hepatic parenchyma at least 4 additional small areas, some in the RIGHT hepatic lobe largest near the IVC confluence on image 78 of series 10 measures 14 mm. Inferior LEFT hepatic lobe  lesion seen on previous CT approximately 2 cm (image 19/5) also suspicious for metastasis. Pancreas: Pancreas also showing very low intrinsic "pre contrast" T1 signal compatible with iron deposition likely related to recent Feraheme administration. Large mass adjacent to or arising from the pancreas in the gastrohepatic region tracking into the hepato gastric recess measuring as much as 5 x 5.2 cm (image 47/10). No pancreatic ductal dilation despite large mass in the region. Spleen:  Spleen normal size and contour, no focal lesion. Adrenals/Urinary Tract:  Adrenal glands are normal. Renal cysts bilaterally. Stomach/Bowel: No acute gastrointestinal process to the extent evaluated. Esophageal abnormalities not visualized on CT. Vascular/Lymphatic: Portal venous narrowing in the mid portal vein, high-grade narrowing. Signal remains within the portal vein and within the SMV. No adenopathy below the renal veins. Celiac adenopathy and RIGHT juxta crural nodal enlargement behind the inferior vena cava nearly contiguous with large mass region of the gastrohepatic ligament. Other:  Trace ascites. Musculoskeletal: No suspicious bone lesions identified. But signs of diffuse iron deposition. IMPRESSION: Signs of malignancy with mass in the region of the hepatogastric recess near the pancreatic head that narrows vascular structures and is associated with hepatic metastatic disease. Exam limited by recent presumed administration of Feraheme which confounds assessment. 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 Pancreatic neoplasm is felt less likely given the lack of peripheral ductal dilation though assessment is quite limited. Consider upper endoscopy and endoscopic ultrasound evaluation for further assessment. Further staging should be performed with CT for at least 60-90 days given the recent iron infusion. Would defer decision for the type per protocol to be  utilized until after the endoscopy. Electronically Signed   By: GZetta BillsM.D.   On: 07/18/2020 07:48   DG Swallowing Func-Speech Pathology  Result Date: 07/20/2020 Formatting of this result is different from the original. Objective Swallowing Evaluation: Type  of Study: MBS-Modified Barium Swallow Study  Patient Details Name: Christian Newton MRN: [id number] Date of Birth: [birth year] Today's Date: 07/20/2020 Time: SLP Start Time (ACUTE ONLY): 0940 -SLP Stop Time (ACUTE ONLY): 1000 SLP Time Calculation (min) (ACUTE ONLY): 20 min Past Medical History: Past Medical History: Diagnosis Date  Cat bite of left hand 05/11/2015  COPD (chronic obstructive pulmonary disease) ([place])   Smoker  Past Surgical History: Past Surgical History: Procedure Laterality Date  APPENDECTOMY    I & D EXTREMITY Left 05/13/2015  Procedure: INCISION AND DRAINAGE OF LEFT EXTREMITY;  Surgeon: Charlotte Crumb, MD;  Location: [facility];  Service: Orthopedics;  Laterality: Left;  INCISION AND DRAINAGE Left 05/13/2015 HPI: 60 year old male with a history of tobacco use, admitted to the hospital with complaints of chest discomfort and shortness of breath.  Described persistent chest pain radiating to his back for the past 2 weeks.  He is also had unintentional weight loss, odynophagia, coughing with all POs  CT scan indicated esophageal thickening.  Seen by GI and underwent EGD that indicated esophageal mass.  Further imaging also indicates possible pancreatic mass and hepatic metastases.  Currently biopsies are pending.  Subjective: alert, pleasant upright in chair at bedside Assessment / Plan / Recommendation CHL IP CLINICAL IMPRESSIONS 07/20/2020 Clinical Impression Pt demonstrates moderate oropharyngeal dysphagia. Primary problem is decreased laryngeal closure with aspiration during the swallow. Attempted moderating bolus size, which is helpful with small single swallows. Otherwise chin tuck, breath hold, head turn left and right all ineffective in  preventing aspiration. Pt is hoarse, there is concern for abnormality of larynx. Cough is ineffective to clear aspirate and cough appears quite irritating and distressing to pt. Pt tolerated soft solids (edentulous) and nectar thick liquids with only trace penetration. Will initiate dys 3/nectar. Pt may benefit from ENT to ensure no additional laryngeal abnormality.Pt will need f/u with OP SLP depending on treatment plan with oncology. SLP Visit Diagnosis Dysphagia, oropharyngeal phase (R13.12) Attention and concentration deficit following -- Frontal lobe and executive function deficit following -- Impact on safety and function Moderate aspiration risk   CHL IP TREATMENT RECOMMENDATION 07/20/2020 Treatment Recommendations Therapy as outlined in treatment plan below   Prognosis 07/20/2020 Prognosis for Safe Diet Advancement Fair Barriers to Reach Goals Severity of deficits Barriers/Prognosis Comment -- CHL IP DIET RECOMMENDATION 07/20/2020 SLP Diet Recommendations Dysphagia 3 (Mech soft) solids;Nectar thick liquid Liquid Administration via Cup;Straw Medication Administration Whole meds with liquid Compensations Slow rate;Small sips/bites;Clear throat intermittently Postural Changes Seated upright at 90 degrees   CHL IP OTHER RECOMMENDATIONS 07/20/2020 Recommended Consults -- Oral Care Recommendations Oral care BID Other Recommendations --   CHL IP FOLLOW UP RECOMMENDATIONS 07/20/2020 Follow up Recommendations Outpatient SLP   CHL IP FREQUENCY AND DURATION 07/20/2020 Speech Therapy Frequency (ACUTE ONLY) min 2x/week Treatment Duration 2 weeks      CHL IP ORAL PHASE 07/20/2020 Oral Phase WFL Oral - Pudding Teaspoon -- Oral - Pudding Cup -- Oral - Honey Teaspoon -- Oral - Honey Cup -- Oral - Nectar Teaspoon -- Oral - Nectar Cup -- Oral - Nectar Straw -- Oral - Thin Teaspoon -- Oral - Thin Cup -- Oral - Thin Straw -- Oral - Puree -- Oral - Mech Soft -- Oral - Regular -- Oral - Multi-Consistency -- Oral - Pill -- Oral Phase -  Comment --  CHL IP PHARYNGEAL PHASE 07/20/2020 Pharyngeal Phase Impaired Pharyngeal- Pudding Teaspoon -- Pharyngeal -- Pharyngeal- Pudding Cup -- Pharyngeal -- Pharyngeal- Honey Teaspoon -- Pharyngeal -- Pharyngeal-  Honey Cup -- Pharyngeal -- Pharyngeal- Nectar Teaspoon -- Pharyngeal -- Pharyngeal- Nectar Cup Reduced airway/laryngeal closure;Penetration/Aspiration during swallow Pharyngeal Material enters airway, CONTACTS cords and not ejected out;Material does not enter airway Pharyngeal- Nectar Straw -- Pharyngeal -- Pharyngeal- Thin Teaspoon -- Pharyngeal -- Pharyngeal- Thin Cup Penetration/Aspiration during swallow;Reduced airway/laryngeal closure Pharyngeal Material enters airway, passes BELOW cords and not ejected out despite cough attempt by patient;Material enters airway, CONTACTS cords and not ejected out Pharyngeal- Thin Straw Reduced airway/laryngeal closure;Penetration/Aspiration during swallow Pharyngeal Material enters airway, CONTACTS cords and not ejected out;Material enters airway, passes BELOW cords and not ejected out despite cough attempt by patient Pharyngeal- Puree WFL Pharyngeal -- Pharyngeal- Mechanical Soft WFL Pharyngeal -- Pharyngeal- Regular -- Pharyngeal -- Pharyngeal- Multi-consistency -- Pharyngeal -- Pharyngeal- Pill WFL Pharyngeal -- Pharyngeal Comment --  No flowsheet data found. DeBlois, Katherene Ponto 07/20/2020, 10:39 AM              CT Angio Chest/Abd/Pel for Dissection W and/or Wo Contrast  Result Date: 07/16/2020 CLINICAL DATA:  Abdominal pain, aortic dissection suspected EXAM: CT ANGIOGRAPHY CHEST, ABDOMEN AND PELVIS TECHNIQUE: Non-contrast CT of the chest was initially obtained. Multidetector CT imaging through the chest, abdomen and pelvis was performed using the standard protocol during bolus administration of intravenous contrast. Multiplanar reconstructed images and MIPs were obtained and reviewed to evaluate the vascular anatomy. CONTRAST:  176m OMNIPAQUE IOHEXOL  350 MG/ML SOLN COMPARISON:  CT angiography chest 10/20/2014 FINDINGS: CTA CHEST FINDINGS Cardiovascular: Satisfactory opacification of the pulmonary arteries to the segmental level. No evidence of pulmonary embolism. Normal heart size. No pericardial effusion. Mediastinum/Nodes: Limited evaluation for mediastinal lymphadenopathy due to timing of contrast. Question subcarinal lymph node. No enlarged hilar or axillary lymph nodes. Thyroid gland and trachea demonstrate no significant findings. Marked diffuse esophageal wall thickening and haziness. Associated posterior mediastinal haziness and soft tissue density. Soft tissue density in haziness abuts the posterior trachea and carina. Lungs/Pleura: No pulmonary nodule. No pulmonary mass. Thin consolidation along the pleura within the posterior right middle lobe. No pleural effusion. No pneumothorax. Musculoskeletal: No chest wall abnormality. No suspicious lytic or blastic osseous lesions. No acute displaced fracture. Multilevel degenerative changes of the spine. Review of the MIP images confirms the above findings. CTA ABDOMEN AND PELVIS FINDINGS VASCULAR Aorta: At least moderate atherosclerotic plaque. Normal caliber aorta without aneurysm, dissection, vasculitis or significant stenosis. Celiac: Mild atherosclerotic plaque. Patent without evidence of aneurysm, dissection, vasculitis or significant stenosis. SMA: Mild atherosclerotic plaque. Patent without evidence of aneurysm, dissection, vasculitis or significant stenosis. Renals: Both renal arteries are patent without evidence of aneurysm, dissection, vasculitis, fibromuscular dysplasia or significant stenosis. IMA: Patent without evidence of aneurysm, dissection, vasculitis or significant stenosis. Inflow: At least moderate atherosclerotic plaque. Patent without evidence of aneurysm, dissection, vasculitis or significant stenosis. Veins: No obvious venous abnormality within the limitations of this arterial phase  study. Review of the MIP images confirms the above findings. NON-VASCULAR Hepatobiliary: There is a 1.5 cm hypodensity within the left hepatic lobe (7:128) as well as a poorly defined 3.1 cm hypodensity within the inferior left lateral hepatic lobe (7:168). No gallstones, gallbladder wall thickening, or pericholecystic fluid. No biliary dilatation. Pancreas: Question 1.6 cm proximal pancreatic mass with markedly limited evaluation due to timing of contrast. Spleen: Normal in size without focal abnormality. Adrenals/Urinary Tract: No adrenal nodule bilaterally. Bilateral kidneys enhance symmetrically. There is a 1.3 cm fluid density lesion within the left kidney that likely represents a simple renal cyst. Subcentimeter hypodensities are too  small to characterize. No hydronephrosis. No hydroureter. The urinary bladder is unremarkable. Stomach/Bowel: Stomach is within normal limits. No evidence of bowel wall thickening or dilatation. Lymphatic: No definite lymphadenopathy. Reproductive: Prostate is unremarkable. Other: No intraperitoneal free fluid. No intraperitoneal free gas. No organized fluid collection. Musculoskeletal: No abdominal wall hernia or abnormality. No suspicious lytic or blastic osseous lesions. No acute displaced fracture. Multilevel degenerative changes of the spine. Review of the MIP images confirms the above findings. IMPRESSION: 1. No acute vascular abnormality. 2. Marked diffuse esophageal wall thickening and haziness as well as surrounding posterior mediastinal haziness/fat stranding. Findings concerning for malignancy versus inflammation. Recommend direct visualization. 3. Mediastinal lymphadenopathy. 4. Question 1.6 cm proximal pancreatic mass with markedly limited evaluation due to timing of contrast. Concern for malignancy. Recommend MRI pancreatic protocol for further evaluation. 5. Indeterminate 1.5 cm as well as another 3.1 cm poorly defined left hepatic lobe hypodensity. These finding  can be further evaluated on MRI pancreatic protocol. Electronically Signed   By: Iven Finn M.D.   On: 07/16/2020 23:42    ASSESSMENT & PLAN Christian Newton 60 y.o. male with medical history significant for COPD who presents for evaluation of metastatic squamous cell cancer of the esophagus.  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 The imaging results were reportedly difficult to interpret with prior administration of feraheme, therefore I do believe it would be reasonable to order a PET CT in order to confirm the presence of metastatic lesions. Treatment of choice for this patient would be palliative radiation therapy to the esophagus followed by systemic chemotherapy  Treatment plan pending the results of MSI/MMR testing. Considerations for treatment would include Cisplatin/5-FU/Nivo vs Cis/Paclitaxel/Camerlizumab. Port placement will be required for either of these regimens.   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  Recommend IR guided port placement --Patient has been referred to radiation oncology for consideration of palliative radiation of the mass in order to help with his dysphagia. --Once the patient is completed his radiation therapy we can begin systemic treatment.  We have ordered PD-L1 status in order to help determine the best course moving forward --Return to clinic in approximately 2 to 3 weeks time for start of chemotherapy.  #Supportive Care -- chemotherapy education to be scheduled  -- port placement to be scheduled.  -- zofran 4m q8H PRN and compazine 185mPO q6H for nausea -- EMLA cream for port   Orders Placed This Encounter   Procedures   IR IMAGING GUIDED PORT INSERTION    Standing Status:   Future    Standing Expiration Date:   07/28/2021    Order Specific Question:   Reason for Exam (SYMPTOM  OR DIAGNOSIS REQUIRED)    Answer:   esophageal cancer, chemotherapy poor venous access    Order Specific Question:   Preferred Imaging Location?    [facility]    Standing Status:   Future    Number of Occurrences:   1    Standing Expiration Date:   07/28/2021   CMP ([facility])    Standing Status:   Future  Number of Occurrences:   1    Standing Expiration Date:   07/28/2021   Ambulatory Referral to [facility] Nutrition    Referral Priority:   Urgent    Referral Type:   Consultation    Referral Reason:   Specialty Services Required    Number of Visits Requested:   1   Ambulatory referral to Social Work    Referral Priority:   Urgent    Referral Type:   Consultation    Referral Reason:   Specialty Services Required    Number of Visits Requested:   1    All questions were answered. The patient knows to call the clinic with any problems, questions or concerns.  A total of more than 60 minutes were spent on this encounter with face-to-face time and non-face-to-face time, including preparing to see the patient, ordering tests and/or medications, counseling the patient and coordination of care as outlined above.   Ledell Peoples, MD Department of Hematology/Oncology [place] [facility] Phone: [phone] Pager: [phone] Email: [email or web address]  08/03/2020 4:39 PM

## 2020-07-28 NOTE — Progress Notes (Signed)
I met with Mr Kindle after his consultation with Dr Lorenso Courier.  I reviewed my role as GI Nurse navigator and proved my business card.  I spoke with him regarding Dr Lorenso Courier recommendation for PEG tube placement. I explained why the need, secondary to effects of radiation therapy.  I explained basics of insertion and use.  He stated he would think about PEG tube placement.  I reviewed need for Port a cath placement and showed him our sample port.  I let him know that referrals have been placed for social work and nutrition.  I reviewed our transportation services.  I told him that our financial advocate will reach out to him for additional financial resources available to him. I also let him know I will be scheduling a PET scan and will call him with that appointment date and time.  I gave him instructions for the PET scan.  All questions were answered.  He told me he was overwhelmed with everything going on.  I provided support and encouraged him to call me with any questions or concerns.  He verbalized understanding.

## 2020-07-28 NOTE — Telephone Encounter (Signed)
Scheduled appt per 7/7 sch msg. Pt aware. Pt requested appt to be a phone visit.

## 2020-07-29 ENCOUNTER — Telehealth: Payer: Self-pay

## 2020-07-29 NOTE — Telephone Encounter (Signed)
Spoke with patient and scheduled an in-person Palliative Consult for 08/25/20 @ 9AM.   COVID screening was negative. No pets in home. Patient lives at [facility].  Consent obtained; updated Outlook/Netsmart/Team List and Epic.   Patient is aware he may be receiving a call from NP the day before or day of to confirm appointment.

## 2020-07-29 NOTE — Progress Notes (Signed)
I spoke with Christian Newton and provied PET scan appt date, time and instructions.  I provided him with [facility] location.  He verbalized understanding

## 2020-07-31 ENCOUNTER — Encounter: Payer: Self-pay | Admitting: Radiation Oncology

## 2020-07-31 DIAGNOSIS — C154 Malignant neoplasm of middle third of esophagus: Secondary | ICD-10-CM | POA: Insufficient documentation

## 2020-07-31 HISTORY — DX: Malignant neoplasm of middle third of esophagus: C15.4

## 2020-07-31 NOTE — Progress Notes (Signed)
Radiation Oncology         [phone] ________________________________  Name: Christian Newton MRN: [id number]  Date: 07/28/2020  DOB: 26-Apr-1960  CC:Pcp, No  Orson Slick, MD     REFERRING PHYSICIAN: Orson Slick, MD   DIAGNOSIS: The primary encounter diagnosis was Malignant neoplasm of esophagus, unspecified location [facility]). 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   HISTORY OF PRESENT ILLNESS::Christian Newton is a 60 y.o. male who is seen for an initial consultation visit regarding the patient's diagnosis of new diagnosis of esophageal cancer.  The patient was found to have some chest discomfort as well as some weight loss.  This ultimately led to an endoscopy which revealed a mid esophageal tumor.  Biopsy confirmed a squamous cell cancer.  CT imaging of the chest abdomen and pelvis revealed thickening of the mid esophagus.  Mediastinal lymphadenopathy was also seen as well as an additional abdominal mass and likely liver metastasis.  An MRI scan of the abdomen did confirm the presence of liver metastases.  The patient is having some significant difficulty swallowing and I have been asked to see the patient for consideration of radiation treatment at this time.    PREVIOUS RADIATION THERAPY: No   PAST MEDICAL HISTORY:  has a past medical history of Cat bite of left hand (05/11/2015), COPD (chronic obstructive pulmonary disease) ([place]), Malignant neoplasm of middle third of esophagus ([place]) (07/31/2020), and Smoker.     PAST SURGICAL HISTORY: Past Surgical History:  Procedure Laterality Date   APPENDECTOMY     BIOPSY  07/18/2020   Procedure: BIOPSY;  Surgeon: Mauri Pole, MD;  Location: [place];  Service: Endoscopy;;   ESOPHAGOGASTRODUODENOSCOPY (EGD) WITH PROPOFOL N/A 07/18/2020   Procedure: ESOPHAGOGASTRODUODENOSCOPY (EGD) WITH PROPOFOL;  Surgeon: Mauri Pole, MD;  Location: [place];   Service: Endoscopy;  Laterality: N/A;   I & D EXTREMITY Left 05/13/2015   Procedure: INCISION AND DRAINAGE OF LEFT EXTREMITY;  Surgeon: Charlotte Crumb, MD;  Location: [place];  Service: Orthopedics;  Laterality: Left;   INCISION AND DRAINAGE Left 05/13/2015     FAMILY HISTORY: family history is not on file.   SOCIAL HISTORY:  reports that he has been smoking cigarettes. He has a 5.00 pack-year smoking history. He has never used smokeless tobacco. He reports that he does not drink alcohol and does not use drugs.   ALLERGIES: Patient has no known allergies.   MEDICATIONS:  Current Outpatient Medications  Medication Sig Dispense Refill   acetaminophen (TYLENOL) 500 MG tablet Take 500 mg by mouth every 6 (six) hours as needed for mild pain or headache.     food thickener (SIMPLYTHICK, NECTAR/LEVEL 2/MILDLY THICK,) GEL Take 1 packet by mouth as needed. 30 packet 0   metoprolol tartrate (LOPRESSOR) 25 MG tablet Take 1 tablet (25 mg total) by mouth 2 (two) times daily. 60 tablet 0   oxyCODONE (OXY IR/ROXICODONE) 5 MG immediate release tablet Take 1 tablet (5 mg total) by mouth every 6 (six) hours as needed for moderate pain or breakthrough pain. 60 tablet 0   No current facility-administered medications for this encounter.     REVIEW OF SYSTEMS:  A 15 point review of systems is documented in the electronic medical record. This was obtained by the nursing staff. However, I reviewed this with the patient to discuss relevant findings and make appropriate changes.  Pertinent items are noted in HPI.  PHYSICAL EXAM:  height is 5\' 8"  (1.727 m) and weight is 117 lb 9.6 oz (53.3 kg). His temperature is 97.8 F (36.6 C). His blood pressure is 108/76 and his pulse is 88. His respiration is 20 and oxygen saturation is 100%.   ECOG = 1  0 - Asymptomatic (Fully active, able to carry on all predisease activities without restriction)  1 - Symptomatic but completely ambulatory (Restricted in physically  strenuous activity but ambulatory and able to carry out work of a light or sedentary nature. For example, light housework, office work)  2 - Symptomatic, <50% in bed during the day (Ambulatory and capable of all self care but unable to carry out any work activities. Up and about more than 50% of waking hours)  3 - Symptomatic, >50% in bed, but not bedbound (Capable of only limited self-care, confined to bed or chair 50% or more of waking hours)  4 - Bedbound (Completely disabled. Cannot carry on any self-care. Totally confined to bed or chair)  5 - Death   Eustace Pen MM, Creech RH, Tormey DC, et al. [phone]). "Toxicity and response criteria of the [facility]". [place] Oncol. 5 (6): 649-55  Alert, no distress   LABORATORY DATA:  Lab Results  Component Value Date   WBC 8.1 07/28/2020   HGB 12.6 (L) 07/28/2020   HCT 37.6 (L) 07/28/2020   MCV 77.8 (L) 07/28/2020   PLT 362 07/28/2020   Lab Results  Component Value Date   NA 137 07/28/2020   K 5.0 07/28/2020   CL 101 07/28/2020   CO2 26 07/28/2020   Lab Results  Component Value Date   ALT 9 07/28/2020   AST 10 (L) 07/28/2020   ALKPHOS 60 07/28/2020   BILITOT 0.7 07/28/2020      RADIOGRAPHY: DG Chest 2 View  Result Date: 07/16/2020 CLINICAL DATA:  Left-sided chest pain and back pain EXAM: CHEST - 2 VIEW COMPARISON:  10/20/2014 FINDINGS: The heart size and mediastinal contours are within normal limits. Pulmonary hyperinflation. The visualized skeletal structures are unremarkable. IMPRESSION: Pulmonary hyperinflation without acute abnormality of the lungs. Electronically Signed   By: Eddie Candle M.D.   On: 07/16/2020 20:14   MR ABDOMEN W WO CONTRAST  Result Date: 07/18/2020 CLINICAL DATA:  Suspected liver metastasis in the setting of pancreatic neoplasm. EXAM: MRI ABDOMEN WITHOUT AND WITH CONTRAST TECHNIQUE: Multiplanar multisequence MR imaging of the abdomen was performed both before and after the  administration of intravenous contrast. CONTRAST:  31mL GADAVIST GADOBUTROL 1 MMOL/ML IV SOLN COMPARISON:  CT evaluation of July 16, 2020. FINDINGS: Lower chest: Diffuse esophageal thickening seen on recent CT of the chest, abdomen and pelvis is not evaluated. There is no dense consolidation. Small bilateral pleural effusions are present. Hepatobiliary: Hepatic iron deposition makes liver assessment limited as does the presence of respiratory motion. There are multiple hepatic lesions. Lesions in terms of enhancement are not well evaluated due to the presence of background signal within the vasculature related to recent Feraheme infusion based on appearance. For this reason the pancreas is also not well evaluated on precontrast imaging. The portal vein is narrowed about the pancreas, main portal vein. The SMV appears patent. Narrowing of the portal vein is in the mid portal vein adjacent to the large mass which is centered in the hepatogastric recess more than in the pancreatic head. Hepatic lesions, dominant lesion in the LEFT hepatic lobe measuring 3.4 x 2.8 cm without signal to suggest cyst, subtraction  images with enhancement pattern compatible with metastatic lesion on subtraction. Motion degraded study. No pericholecystic stranding.  No biliary duct dilation. The lesion in the anterior LEFT hemi liver, hepatic subsegment II (image 14 of series 19) also likely a metastatic focus. The presence of profound iron deposition in the liver again limiting assessment of these areas. Other areas of diffusion related signal in the hepatic parenchyma at least 4 additional small areas, some in the RIGHT hepatic lobe largest near the IVC confluence on image 78 of series 10 measures 14 mm. Inferior LEFT hepatic lobe lesion seen on previous CT approximately 2 cm (image 19/5) also suspicious for metastasis. Pancreas: Pancreas also showing very low intrinsic "pre contrast" T1 signal compatible with iron deposition likely related  to recent Feraheme administration. Large mass adjacent to or arising from the pancreas in the gastrohepatic region tracking into the hepato gastric recess measuring as much as 5 x 5.2 cm (image 47/10). No pancreatic ductal dilation despite large mass in the region. Spleen:  Spleen normal size and contour, no focal lesion. Adrenals/Urinary Tract:  Adrenal glands are normal. Renal cysts bilaterally. Stomach/Bowel: No acute gastrointestinal process to the extent evaluated. Esophageal abnormalities not visualized on CT. Vascular/Lymphatic: Portal venous narrowing in the mid portal vein, high-grade narrowing. Signal remains within the portal vein and within the SMV. No adenopathy below the renal veins. Celiac adenopathy and RIGHT juxta crural nodal enlargement behind the inferior vena cava nearly contiguous with large mass region of the gastrohepatic ligament. Other:  Trace ascites. Musculoskeletal: No suspicious bone lesions identified. But signs of diffuse iron deposition. IMPRESSION: Signs of malignancy with mass in the region of the hepatogastric recess near the pancreatic head that narrows vascular structures and is associated with hepatic metastatic disease. Exam limited by recent presumed administration of Feraheme which confounds assessment. 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 Pancreatic neoplasm is felt less likely given the lack of peripheral ductal dilation though assessment is quite limited. Consider upper endoscopy and endoscopic ultrasound evaluation for further assessment. Further staging should be performed with CT for at least 60-90 days given the recent iron infusion. Would defer decision for the type per protocol to be utilized until after the endoscopy. Electronically Signed   By: Zetta Bills M.D.   On: 07/18/2020 07:48   DG Swallowing Func-Speech Pathology  Result Date: 07/20/2020 Formatting of this result is different  from the original. Objective Swallowing Evaluation: Type of Study: MBS-Modified Barium Swallow Study  Patient Details Name: Christian Newton MRN: [id number] Date of Birth: May 01, 1960 Today's Date: 07/20/2020 Time: SLP Start Time (ACUTE ONLY): 0940 -SLP Stop Time (ACUTE ONLY): 1000 SLP Time Calculation (min) (ACUTE ONLY): 20 min Past Medical History: Past Medical History: Diagnosis Date  Cat bite of left hand 05/11/2015  COPD (chronic obstructive pulmonary disease) ([place])   Smoker  Past Surgical History: Past Surgical History: Procedure Laterality Date  APPENDECTOMY    I & D EXTREMITY Left 05/13/2015  Procedure: INCISION AND DRAINAGE OF LEFT EXTREMITY;  Surgeon: Charlotte Crumb, MD;  Location: [place];  Service: Orthopedics;  Laterality: Left;  INCISION AND DRAINAGE Left 05/13/2015 HPI: 60 year old male with a history of tobacco use, admitted to the hospital with complaints of chest discomfort and shortness of breath.  Described persistent chest pain radiating to his back for the past 2 weeks.  He is also had unintentional weight loss, odynophagia, coughing with all POs  CT scan indicated esophageal thickening.  Seen  by GI and underwent EGD that indicated esophageal mass.  Further imaging also indicates possible pancreatic mass and hepatic metastases.  Currently biopsies are pending.  Subjective: alert, pleasant upright in chair at bedside Assessment / Plan / Recommendation CHL IP CLINICAL IMPRESSIONS 07/20/2020 Clinical Impression Pt demonstrates moderate oropharyngeal dysphagia. Primary problem is decreased laryngeal closure with aspiration during the swallow. Attempted moderating bolus size, which is helpful with small single swallows. Otherwise chin tuck, breath hold, head turn left and right all ineffective in preventing aspiration. Pt is hoarse, there is concern for abnormality of larynx. Cough is ineffective to clear aspirate and cough appears quite irritating and distressing to pt. Pt tolerated soft solids  (edentulous) and nectar thick liquids with only trace penetration. Will initiate dys 3/nectar. Pt may benefit from ENT to ensure no additional laryngeal abnormality.Pt will need f/u with OP SLP depending on treatment plan with oncology. SLP Visit Diagnosis Dysphagia, oropharyngeal phase (R13.12) Attention and concentration deficit following -- Frontal lobe and executive function deficit following -- Impact on safety and function Moderate aspiration risk   CHL IP TREATMENT RECOMMENDATION 07/20/2020 Treatment Recommendations Therapy as outlined in treatment plan below   Prognosis 07/20/2020 Prognosis for Safe Diet Advancement Fair Barriers to Reach Goals Severity of deficits Barriers/Prognosis Comment -- CHL IP DIET RECOMMENDATION 07/20/2020 SLP Diet Recommendations Dysphagia 3 (Mech soft) solids;Nectar thick liquid Liquid Administration via Cup;Straw Medication Administration Whole meds with liquid Compensations Slow rate;Small sips/bites;Clear throat intermittently Postural Changes Seated upright at 90 degrees   CHL IP OTHER RECOMMENDATIONS 07/20/2020 Recommended Consults -- Oral Care Recommendations Oral care BID Other Recommendations --   CHL IP FOLLOW UP RECOMMENDATIONS 07/20/2020 Follow up Recommendations Outpatient SLP   CHL IP FREQUENCY AND DURATION 07/20/2020 Speech Therapy Frequency (ACUTE ONLY) min 2x/week Treatment Duration 2 weeks      CHL IP ORAL PHASE 07/20/2020 Oral Phase WFL Oral - Pudding Teaspoon -- Oral - Pudding Cup -- Oral - Honey Teaspoon -- Oral - Honey Cup -- Oral - Nectar Teaspoon -- Oral - Nectar Cup -- Oral - Nectar Straw -- Oral - Thin Teaspoon -- Oral - Thin Cup -- Oral - Thin Straw -- Oral - Puree -- Oral - Mech Soft -- Oral - Regular -- Oral - Multi-Consistency -- Oral - Pill -- Oral Phase - Comment --  CHL IP PHARYNGEAL PHASE 07/20/2020 Pharyngeal Phase Impaired Pharyngeal- Pudding Teaspoon -- Pharyngeal -- Pharyngeal- Pudding Cup -- Pharyngeal -- Pharyngeal- Honey Teaspoon -- Pharyngeal --  Pharyngeal- Honey Cup -- Pharyngeal -- Pharyngeal- Nectar Teaspoon -- Pharyngeal -- Pharyngeal- Nectar Cup Reduced airway/laryngeal closure;Penetration/Aspiration during swallow Pharyngeal Material enters airway, CONTACTS cords and not ejected out;Material does not enter airway Pharyngeal- Nectar Straw -- Pharyngeal -- Pharyngeal- Thin Teaspoon -- Pharyngeal -- Pharyngeal- Thin Cup Penetration/Aspiration during swallow;Reduced airway/laryngeal closure Pharyngeal Material enters airway, passes BELOW cords and not ejected out despite cough attempt by patient;Material enters airway, CONTACTS cords and not ejected out Pharyngeal- Thin Straw Reduced airway/laryngeal closure;Penetration/Aspiration during swallow Pharyngeal Material enters airway, CONTACTS cords and not ejected out;Material enters airway, passes BELOW cords and not ejected out despite cough attempt by patient Pharyngeal- Puree WFL Pharyngeal -- Pharyngeal- Mechanical Soft WFL Pharyngeal -- Pharyngeal- Regular -- Pharyngeal -- Pharyngeal- Multi-consistency -- Pharyngeal -- Pharyngeal- Pill WFL Pharyngeal -- Pharyngeal Comment --  No flowsheet data found. De[place], Katherene Ponto 07/20/2020, 10:39 AM              CT Angio Chest/Abd/Pel for Dissection W and/or Wo Contrast  Result Date:  07/16/2020 CLINICAL DATA:  Abdominal pain, aortic dissection suspected EXAM: CT ANGIOGRAPHY CHEST, ABDOMEN AND PELVIS TECHNIQUE: Non-contrast CT of the chest was initially obtained. Multidetector CT imaging through the chest, abdomen and pelvis was performed using the standard protocol during bolus administration of intravenous contrast. Multiplanar reconstructed images and MIPs were obtained and reviewed to evaluate the vascular anatomy. CONTRAST:  124mL OMNIPAQUE IOHEXOL 350 MG/ML SOLN COMPARISON:  CT angiography chest 10/20/2014 FINDINGS: CTA CHEST FINDINGS Cardiovascular: Satisfactory opacification of the pulmonary arteries to the segmental level. No evidence of  pulmonary embolism. Normal heart size. No pericardial effusion. Mediastinum/Nodes: Limited evaluation for mediastinal lymphadenopathy due to timing of contrast. Question subcarinal lymph node. No enlarged hilar or axillary lymph nodes. Thyroid gland and trachea demonstrate no significant findings. Marked diffuse esophageal wall thickening and haziness. Associated posterior mediastinal haziness and soft tissue density. Soft tissue density in haziness abuts the posterior trachea and carina. Lungs/Pleura: No pulmonary nodule. No pulmonary mass. Thin consolidation along the pleura within the posterior right middle lobe. No pleural effusion. No pneumothorax. Musculoskeletal: No chest wall abnormality. No suspicious lytic or blastic osseous lesions. No acute displaced fracture. Multilevel degenerative changes of the spine. Review of the MIP images confirms the above findings. CTA ABDOMEN AND PELVIS FINDINGS VASCULAR Aorta: At least moderate atherosclerotic plaque. Normal caliber aorta without aneurysm, dissection, vasculitis or significant stenosis. Celiac: Mild atherosclerotic plaque. Patent without evidence of aneurysm, dissection, vasculitis or significant stenosis. SMA: Mild atherosclerotic plaque. Patent without evidence of aneurysm, dissection, vasculitis or significant stenosis. Renals: Both renal arteries are patent without evidence of aneurysm, dissection, vasculitis, fibromuscular dysplasia or significant stenosis. IMA: Patent without evidence of aneurysm, dissection, vasculitis or significant stenosis. Inflow: At least moderate atherosclerotic plaque. Patent without evidence of aneurysm, dissection, vasculitis or significant stenosis. Veins: No obvious venous abnormality within the limitations of this arterial phase study. Review of the MIP images confirms the above findings. NON-VASCULAR Hepatobiliary: There is a 1.5 cm hypodensity within the left hepatic lobe (7:128) as well as a poorly defined 3.1 cm  hypodensity within the inferior left lateral hepatic lobe (7:168). No gallstones, gallbladder wall thickening, or pericholecystic fluid. No biliary dilatation. Pancreas: Question 1.6 cm proximal pancreatic mass with markedly limited evaluation due to timing of contrast. Spleen: Normal in size without focal abnormality. Adrenals/Urinary Tract: No adrenal nodule bilaterally. Bilateral kidneys enhance symmetrically. There is a 1.3 cm fluid density lesion within the left kidney that likely represents a simple renal cyst. Subcentimeter hypodensities are too small to characterize. No hydronephrosis. No hydroureter. The urinary bladder is unremarkable. Stomach/Bowel: Stomach is within normal limits. No evidence of bowel wall thickening or dilatation. Lymphatic: No definite lymphadenopathy. Reproductive: Prostate is unremarkable. Other: No intraperitoneal free fluid. No intraperitoneal free gas. No organized fluid collection. Musculoskeletal: No abdominal wall hernia or abnormality. No suspicious lytic or blastic osseous lesions. No acute displaced fracture. Multilevel degenerative changes of the spine. Review of the MIP images confirms the above findings. IMPRESSION: 1. No acute vascular abnormality. 2. Marked diffuse esophageal wall thickening and haziness as well as surrounding posterior mediastinal haziness/fat stranding. Findings concerning for malignancy versus inflammation. Recommend direct visualization. 3. Mediastinal lymphadenopathy. 4. Question 1.6 cm proximal pancreatic mass with markedly limited evaluation due to timing of contrast. Concern for malignancy. Recommend MRI pancreatic protocol for further evaluation. 5. Indeterminate 1.5 cm as well as another 3.1 cm poorly defined left hepatic lobe hypodensity. These finding can be further evaluated on MRI pancreatic protocol. Electronically Signed   By: Iven Finn  M.D.   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  He is having some substantial difficulty swallowing and likely would benefit from a short course of palliative radiation treatment.  We discussed an approximate 2-1/2 to 3-week course.  He will proceed with simulation next week for treatment planning.  We discussed the rationale of treatment to try to improve local control and to improve his swallowing.  We also discussed possible side effects and risks.  All of his questions were answered and he indicated that he did wish to proceed with such a treatment.  We therefore will proceed with palliative radiation treatment hopefully beginning next week.  The patient was seen in person today in clinic.  The total time spent on the patient's visit today was 45 minutes, including chart review, direct discussion/evaluation with the patient, and coordination of care.        ________________________________   Jodelle Gross, MD, PhD   **Disclaimer: This note was dictated with voice recognition software. Similar sounding words can inadvertently be transcribed and this note may contain transcription errors which may not have been corrected upon publication of note.**

## 2020-08-02 ENCOUNTER — Ambulatory Visit
Admission: RE | Admit: 2020-08-02 | Discharge: 2020-08-02 | Disposition: A | Discharge status: Home / Self Care | Payer: Medicaid Other | Source: Ambulatory Visit | Attending: Radiation Oncology | Admitting: Radiation Oncology

## 2020-08-02 ENCOUNTER — Other Ambulatory Visit: Payer: Self-pay

## 2020-08-02 ENCOUNTER — Telehealth: Payer: Self-pay | Admitting: Radiation Oncology

## 2020-08-02 DIAGNOSIS — Z510 Encounter for antineoplastic radiation therapy: Secondary | ICD-10-CM | POA: Diagnosis not present

## 2020-08-02 DIAGNOSIS — F17210 Nicotine dependence, cigarettes, uncomplicated: Secondary | ICD-10-CM | POA: Diagnosis not present

## 2020-08-02 DIAGNOSIS — Z79899 Other long term (current) drug therapy: Secondary | ICD-10-CM | POA: Diagnosis not present

## 2020-08-02 DIAGNOSIS — C154 Malignant neoplasm of middle third of esophagus: Secondary | ICD-10-CM | POA: Diagnosis present

## 2020-08-02 MED ORDER — PANTOPRAZOLE SODIUM 40 MG PO TBEC: 40.0000 mg | DELAYED_RELEASE_TABLET | Freq: Every day | ORAL | 1 refills | Status: DC

## 2020-08-02 MED ORDER — DEXAMETHASONE 4 MG PO TABS: 4.0000 mg | ORAL_TABLET | Freq: Two times a day (BID) | ORAL | 0 refills | Status: DC

## 2020-08-02 NOTE — Telephone Encounter (Signed)
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 Dr. Lisbeth Renshaw favors treating this with palliative radiotherapy along with the plans for treating the esophagus. The concern is that the tumor is closely encroaching on the epidural space and he is at risk of spinal cord compression. I attempted to call the patient as well as his brother to notify them of the findings and that dexamethasone 4 mg BID and Pantroprazole 40 mg daily were called into his pharmacy. We will try to reach out to him again tomorrow. He will start radiation to both sites on Thursday of this week.

## 2020-08-03 ENCOUNTER — Inpatient Hospital Stay: Payer: Medicaid Other | Admitting: Dietician

## 2020-08-03 ENCOUNTER — Encounter: Payer: Self-pay | Admitting: Hematology and Oncology

## 2020-08-03 ENCOUNTER — Telehealth: Payer: Self-pay

## 2020-08-03 DIAGNOSIS — Z510 Encounter for antineoplastic radiation therapy: Secondary | ICD-10-CM | POA: Diagnosis not present

## 2020-08-03 NOTE — Telephone Encounter (Signed)
   Kreig Parson DOB: [birth year] MRN: [id number]   RIDER WAIVER AND RELEASE OF LIABILITY  For purposes of improving physical access to our facilities, [facility] is pleased to partner with third parties to provide [facility] patients or other authorized individuals the option of convenient, on-demand ground transportation services (the [place]") through use of the technology service that enables users to request on-demand ground transportation from independent third-party providers.  By opting to use and accept these [employer], I, the undersigned, hereby agree on behalf of myself, and on behalf of any minor child using the Government social research officer for whom I am the parent or legal guardian, as follows:  Government social research officer provided to me are provided by independent third-party transportation providers who are not [employer] or employees and who are unaffiliated with [employer]. [facility] is neither a transportation carrier nor a common or public carrier. [facility] has no control over the quality or safety of the transportation that occurs as a result of the [employer]. [facility] cannot guarantee that any third-party transportation provider will complete any arranged transportation service. [facility] makes no representation, warranty, or guarantee regarding the reliability, timeliness, quality, safety, suitability, or availability of any of the Transport Services or that they will be error free. I fully understand that traveling by vehicle involves risks and dangers of serious bodily injury, including permanent disability, paralysis, and death. I agree, on behalf of myself and on behalf of any minor child using the Transport Services for whom I am the parent or legal guardian, that the entire risk arising out of my use of the [employer] remains solely with me, to the maximum extent permitted under applicable law. The [employer] are provided "as  is" and "as available." [facility] disclaims all representations and warranties, express, implied or statutory, not expressly set out in these terms, including the implied warranties of merchantability and fitness for a particular purpose. I hereby waive and release [facility], its agents, employees, officers, directors, representatives, insurers, attorneys, assigns, successors, subsidiaries, and affiliates from any and all past, present, or future claims, demands, liabilities, actions, causes of action, or suits of any kind directly or indirectly arising from acceptance and use of the [employer]. I further waive and release [facility] and its affiliates from all present and future liability and responsibility for any injury or death to persons or damages to property caused by or related to the use of the [employer]. I have read this Waiver and Release of Liability, and I understand the terms used in it and their legal significance. This Waiver is freely and voluntarily given with the understanding that my right (as well as the right of any minor child for whom I am the parent or legal guardian using the [employer]) to legal recourse against [facility] in connection with the [employer] is knowingly surrendered in return for use of these services.   I attest that I read the consent document to [name], gave Mr. Lehenbauer the opportunity to ask questions and answered the questions asked (if any). I affirm that [name] then provided consent for he's participation in this program.    Cameron Proud

## 2020-08-03 NOTE — Progress Notes (Signed)
START OFF PATHWAY REGIMEN - Gastroesophageal   [id number] (Leucovorin IV D1 + Fluorouracil IV D1/CIV D1,2 + Oxaliplatin IV D1) q14 Days:   A cycle is every 14 days:     Oxaliplatin      Leucovorin      Fluorouracil      Fluorouracil   **Always confirm dose/schedule in your pharmacy ordering system**  Patient Characteristics: Distant Metastases (cM1/pM1) / Locally Recurrent Disease, Squamous Cell, Esophageal & GE Junction, First Line, PD?L1 Expression  CPS < 10/Negative/Unknown, No Prior Taxane Histology: Squamous Cell Disease Classification: Esophageal Therapeutic Status: Distant Metastases (No Additional Staging) Line of Therapy: First Line PD-L1 Expression Status: Awaiting Test Results Taxane Status: No Prior Taxane Intent of Therapy: Non-Curative / Palliative Intent, Discussed with Patient

## 2020-08-03 NOTE — Progress Notes (Signed)
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 Plans for palliative radiation therapy followed by chemotherapy.  Past medical history includes COPD, current smoker.   Spoke with patient via telephone. He reports throat is sore today. Patient reports he has never had a big appetite, but is eating less than usual due to dysphagia. SLP recommended soft diet with nectar thick liquids s/p MBS completed during recent hospital admission. Patient reports he is using Simply Thick in liquids to nectar thick consistency, says he is drinking ~1 quart of water and some juice daily. Patient recalls eating eggs, grits, chicken and sausage today. Patient reports he is breaking up meats into very small pieces. He endorses unintentional weight loss, recalls usual weight ~165 lb. He does not remember the last time he weighed this, states it was a long time ago.    Medications: Decadron, Protonix, Oxycodone, Simply Thick   Labs: 7/7 Glucose 107, Cr 1.49   Anthropometrics: Weight 117 lb 9.6 oz on 7/7 decreased 9 lbs (7.1%) in 10 days from 126 lb on 6/27. This is significant  Height: 5'8" Weight: 53.3 kg (7/07) UBW: 165 lb per pt  BMI: 17.88 (underweight)   Estimated Energy Needs  Kcals: 6301-6010 Protein: 85-96 Fluid: 1.7 L   NUTRITION DIAGNOSIS: Inadequate oral intake related to newly diagnosed esophageal cancer as evidenced by dysphagia and significant 48 lb (29%) decrease from usual body weight.  MALNUTRITION DIAGNOSIS: Given significant weight loss and inadequate oral intake secondary to dysphagia, highly suspect malnutrition, however unable to identify without completion of NFPE.    INTERVENTION:   Discussed concerns about patient ability to maintain nutrition/hydration orally and recommended feeding tube placement (pt is considering PEG)  Educated on the importance of  adequate calorie and protein energy intake to maintain weight, strength, nutrition Educated on soft, moist high protein foods Discussed ways to alter texture of foods Recommended intake of oral nutrition supplements, will provide samples of Reason supplement for pt to try (450 kcal, 18 g protein, nectar thick) Reason samples, handouts, contact information left at registration desk for patient pick-up on 7/14 (pt aware)  MONITORING, EVALUATION, GOAL:  Patient will tolerate increased calorie and protein energy intake to prevent further weight loss  If patient agreeable to placement of feeding tube, recommend: Osmolite 1.5 - 6 cartons/day split over 4 feedings. Flush with 60 ml water before and after each feeding. Drink by mouth or give via tube additional 2 cups water daily. -This provides 1775 kcal, 74.5 g protein, 1865 ml total water   Next Visit: Friday July 15 in clinic

## 2020-08-04 ENCOUNTER — Encounter: Payer: Self-pay | Admitting: Hematology and Oncology

## 2020-08-04 ENCOUNTER — Encounter: Payer: Self-pay | Admitting: General Practice

## 2020-08-04 ENCOUNTER — Other Ambulatory Visit: Payer: Self-pay

## 2020-08-04 ENCOUNTER — Ambulatory Visit
Admission: RE | Admit: 2020-08-04 | Discharge: 2020-08-04 | Disposition: A | Discharge status: Home / Self Care | Payer: Medicaid Other | Source: Ambulatory Visit | Attending: Radiation Oncology | Admitting: Radiation Oncology

## 2020-08-04 DIAGNOSIS — Z510 Encounter for antineoplastic radiation therapy: Secondary | ICD-10-CM | POA: Diagnosis not present

## 2020-08-04 NOTE — Progress Notes (Signed)
The patient was seen today to confirm that he had received my message, we discussed the importance of continuing steroids and PPI therapy both of which she has begun.  I explained the findings from his simulation images and the concern of new disease that was not picked up on prior scans in the thoracic spine, he does admit to having some pain in the upper trunk.  He denies any loss of function in terms of movement or sensation.  He is aware that he needs to contact us immediately if he started having loss of control of lower extremities upper extremities or progressive weakness.  We discussed the importance of treating this area as well with radiotherapy and another separate consent was signed and a copy provided to the patient outlining our plans for treating the thoracic spine.  He will receive his first treatment to include the esophageal tumor and the spine at the same time today.

## 2020-08-05 ENCOUNTER — Other Ambulatory Visit: Payer: Self-pay

## 2020-08-05 ENCOUNTER — Inpatient Hospital Stay: Payer: Medicaid Other | Admitting: Dietician

## 2020-08-05 ENCOUNTER — Inpatient Hospital Stay: Payer: Medicaid Other | Admitting: General Practice

## 2020-08-05 ENCOUNTER — Ambulatory Visit
Admission: RE | Admit: 2020-08-05 | Discharge: 2020-08-05 | Disposition: A | Discharge status: Home / Self Care | Payer: Medicaid Other | Source: Ambulatory Visit | Attending: Radiation Oncology | Admitting: Radiation Oncology

## 2020-08-05 DIAGNOSIS — C154 Malignant neoplasm of middle third of esophagus: Secondary | ICD-10-CM

## 2020-08-05 DIAGNOSIS — Z510 Encounter for antineoplastic radiation therapy: Secondary | ICD-10-CM | POA: Diagnosis not present

## 2020-08-05 DIAGNOSIS — R1319 Other dysphagia: Secondary | ICD-10-CM

## 2020-08-05 NOTE — Progress Notes (Addendum)
Nutrition Follow-up:  Patient with newly diagnosed SCC of esophagus metastatic to liver, pancreas, spine. Patient currently receiving radiation therapy, followed by chemotherapy.   Met with patient in clinic. He reports receiving sample nectar thick supplements and handouts left for him 7/13. Patient reports trying the Reason supplements, says he liked them and drank the last one this morning. Patient reports he is not eating much, but never ate much anyway. Patient reports having 4 roommates, states they help him if he needs something. He has thought about having feeding tube placed and would like to proceed.   Medications: Decadron, Protonix, Oxycodone, Simply Thick  Labs: reviewed  Anthropometrics: Weight 117 lb 9.6 oz on 7/7 decreased 9 lbs (7.1%) in 10 days from 126 lb on 6/27. This is significant    Estimated Energy Needs  Kcals: 5750-5183 Protein: 85-96 g Fluid: 1.7 L  NUTRITION DIAGNOSIS: Inadequate oral intake ongoing  INTERVENTION:  Continue eating soft, moist high protein foods Encouraged drinking 2-3 Reason supplements/day (455 kcal, 18 g protein, nectar thick) Complimentary 1 week supply of Reason provided to patient today Patient agreeable to feeding tube, nurse navigator contacted to place orders Once feeding tube is placed, recommend Osmolite 1.5 - 6 cartons/day split over 4 feedings. Flush with 60 ml water before and after each feeding. Drink by mouth or give via tube additional 2 cups water daily. -This provides 1775 kcal, 74.5 g protein, 1865 ml total water      MONITORING, EVALUATION, GOAL:  Weight trends, intake, feeding tube placement   NEXT VISIT: Tuesday July 26 in clinic

## 2020-08-05 NOTE — Progress Notes (Addendum)
[facility] Work  Initial Assessment   Christian Newton is a 60 y.o. year old male presenting alone. Clinical Social Work was referred by medical oncologist for assessment of psychosocial needs.   SDOH (Social Determinants of Health) assessments performed: Yes SDOH Interventions    Flowsheet Row Most Recent Value  SDOH Interventions   Food Insecurity Interventions Assist with SNAP Application  [[facility] to assist w SNAP application, [place] food pantry as needed]  Technical sales engineer  Housing Interventions Intervention Not Indicated  Transportation Interventions [facility]       Distress Screen completed: No No flowsheet data found.    Family/Social Information:  Housing Arrangement: patient lives with roomates in a transitional housing program under the auspices of [employer].  He has his own room but shares common areas w roommates.  Everyone lives independently and is reponsible for paying rent/food/share of utilities. Family members/support persons in your life? Family, has brother and niece.  They are able to provide emotional support, not available for practical needs.  [facility] has thus far allowed him to remain housed in their program despite inability to pay rent due to lack of income Transportation concerns: yes, no car, uses [employer]  Employment: Unemployed. Income source: No income, worked at [employer] more recently.  Unable to work at this time due to SOB, cancer, fatigue Financial concerns: Yes, due to illness and/or loss of work during treatment Type of concern: Utilities, Phone, Transportation, Medical bills, and Food Food access concerns: yes, does not have [place] will be asked to take [employer] application.  Able to eat a limited number of items like grits, eggs, creamed potatoes.  Will have feeding tube per patient. Religious or spiritual practice: did  not assess Medication Concerns: no, says he has paid for medications from his savings thus far  Services Currently in place:  [employer]  Coping/ Adjustment to diagnosis: Patient understands treatment plan and what happens next? yes, newly diagnosed with Stage IV esophageal cancer.  Will have palliative radiation for 2 - 3 weeks to help with swallowing.  He will also have chemotherapy.  He also has COPD with SOB, difficulty talking, fatigue, difficulty w exertion.  He is independent but struggles to make himself understood over the phone.  He states that he is "feeling better" now that he has started treatment, tries to "not think too far ahead" and states "the whole thing has not sunk in for me yet, I am taking it one day at a time." Concerns about diagnosis and/or treatment: Overwhelmed by information and Afraid of cancer Patient reported stressors: Insurance, Publishing rights manager, Haematologist, Adjusting to my illness, and Facing my mortality Hopes and priorities: stabilize his situation by gaining income to support himself, maintain current housing Patient enjoys watching TV - states this is about all he can do while at home, exertion of any kind is exhausting and he finds his attention span is limited Current coping skills/ strengths: Capable of independent living and Motivation for treatment/growth    SUMMARY: Current SDOH Barriers:  Financial constraints related to no income or insurance, Limited social support, Transportation, and Social Isolation  Clinical Social Work Clinical Goal(s):  patient will work with SW to address concerns related to gaining financial stability, overcome any treatment barriers, provide support as needed  Interventions: Discussed common feeling and emotions when being diagnosed with cancer, and the importance of support during treatment Informed patient of the support team roles  and support services at [facility] Provided [facility] contact information and encouraged patient to call with  any questions or concerns Referred patient to [facility] (interview scheduled by phone on Tuesday July 19 at 11 AM, patient aware of the call), [place], [place] to take [employer] given today, patient declined food bag from pantry but will pick up Monday   Follow Up Plan: Patient will come to support center 7/18 to get a bag of food, CSW will schedule follow up visit when he is in infusion Patient verbalizes understanding of plan: Yes    [place] Work, [place], [place] Worker Phone:  [phone]

## 2020-08-08 ENCOUNTER — Other Ambulatory Visit: Payer: Self-pay

## 2020-08-08 ENCOUNTER — Ambulatory Visit
Admission: RE | Admit: 2020-08-08 | Discharge: 2020-08-08 | Disposition: A | Discharge status: Home / Self Care | Payer: Medicaid Other | Source: Ambulatory Visit | Attending: Radiation Oncology | Admitting: Radiation Oncology

## 2020-08-08 DIAGNOSIS — Z510 Encounter for antineoplastic radiation therapy: Secondary | ICD-10-CM | POA: Diagnosis not present

## 2020-08-08 NOTE — Progress Notes (Signed)
I spoke with Mr Christian Newton this afternoon.  I reviewed his upcoming PET scan appt date, time, and instructions.  I let him know we have requested a feeding tube be placed in the IR department at [facility].  I reviewed his appt for port placement in IR as well.  I will call him on Friday to review the instructions.  He verbalized understanding.

## 2020-08-09 ENCOUNTER — Ambulatory Visit
Admission: RE | Admit: 2020-08-09 | Discharge: 2020-08-09 | Disposition: A | Discharge status: Home / Self Care | Payer: Medicaid Other | Source: Ambulatory Visit | Attending: Radiation Oncology | Admitting: Radiation Oncology

## 2020-08-09 DIAGNOSIS — Z510 Encounter for antineoplastic radiation therapy: Secondary | ICD-10-CM | POA: Diagnosis not present

## 2020-08-10 ENCOUNTER — Other Ambulatory Visit: Payer: Self-pay

## 2020-08-10 ENCOUNTER — Ambulatory Visit
Admission: RE | Admit: 2020-08-10 | Discharge: 2020-08-10 | Disposition: A | Discharge status: Home / Self Care | Payer: Medicaid Other | Source: Ambulatory Visit | Attending: Radiation Oncology | Admitting: Radiation Oncology

## 2020-08-10 DIAGNOSIS — Z510 Encounter for antineoplastic radiation therapy: Secondary | ICD-10-CM | POA: Diagnosis not present

## 2020-08-11 ENCOUNTER — Ambulatory Visit (HOSPITAL_COMMUNITY)
Admission: RE | Admit: 2020-08-11 | Discharge: 2020-08-11 | Disposition: A | Discharge status: Home / Self Care | Payer: Medicaid Other | Source: Ambulatory Visit | Attending: Oncology | Admitting: Oncology

## 2020-08-11 ENCOUNTER — Ambulatory Visit
Admission: RE | Admit: 2020-08-11 | Discharge: 2020-08-11 | Disposition: A | Discharge status: Home / Self Care | Payer: Medicaid Other | Source: Ambulatory Visit | Attending: Radiation Oncology | Admitting: Radiation Oncology

## 2020-08-11 DIAGNOSIS — C159 Malignant neoplasm of esophagus, unspecified: Secondary | ICD-10-CM

## 2020-08-11 DIAGNOSIS — Z510 Encounter for antineoplastic radiation therapy: Secondary | ICD-10-CM | POA: Diagnosis not present

## 2020-08-11 LAB — GLUCOSE, CAPILLARY: Glucose-Capillary: 101 mg/dL — ABNORMAL HIGH (ref 70–99)

## 2020-08-11 MED ORDER — FLUDEOXYGLUCOSE F - 18 (FDG) INJECTION
5.8500 | Freq: Once | INTRAVENOUS | Status: AC
  Administered 2020-08-11: 5.81 via INTRAVENOUS

## 2020-08-12 ENCOUNTER — Other Ambulatory Visit: Payer: Self-pay | Admitting: Student

## 2020-08-12 ENCOUNTER — Telehealth: Payer: Self-pay | Admitting: Hematology

## 2020-08-12 ENCOUNTER — Ambulatory Visit
Admission: RE | Admit: 2020-08-12 | Discharge: 2020-08-12 | Disposition: A | Discharge status: Home / Self Care | Payer: Medicaid Other | Source: Ambulatory Visit | Attending: Radiation Oncology | Admitting: Radiation Oncology

## 2020-08-12 ENCOUNTER — Other Ambulatory Visit: Payer: Self-pay

## 2020-08-12 DIAGNOSIS — Z510 Encounter for antineoplastic radiation therapy: Secondary | ICD-10-CM | POA: Diagnosis not present

## 2020-08-12 NOTE — Telephone Encounter (Signed)
Scheduled appointment per 07/22 sch msg. Patient is aware. 

## 2020-08-12 NOTE — Progress Notes (Signed)
I received a call from tiffany in IR.  She spoke with Christian Newton.  He stated he did not have anyone to accompany him home after his port placement on Monday 08/15/2020.  I spoke with Christian Newton and explained the importance of the port a cath in his treatment.  I explained he needs an adult to accompany him home to make sure he is already after port placement.  He stated he would be able  to find some one to take him home on Monday.  I left a message for Tiffany with this information.

## 2020-08-13 ENCOUNTER — Other Ambulatory Visit: Payer: Self-pay | Admitting: Hematology

## 2020-08-15 ENCOUNTER — Other Ambulatory Visit: Payer: Self-pay

## 2020-08-15 ENCOUNTER — Ambulatory Visit
Admission: RE | Admit: 2020-08-15 | Discharge: 2020-08-15 | Disposition: A | Discharge status: Home / Self Care | Payer: Medicaid Other | Source: Ambulatory Visit | Attending: Radiation Oncology | Admitting: Radiation Oncology

## 2020-08-15 ENCOUNTER — Ambulatory Visit (HOSPITAL_COMMUNITY)
Admission: RE | Admit: 2020-08-15 | Discharge: 2020-08-15 | Disposition: A | Discharge status: Home / Self Care | Payer: Medicaid Other | Source: Ambulatory Visit | Attending: Hematology and Oncology | Admitting: Hematology and Oncology

## 2020-08-15 ENCOUNTER — Encounter (HOSPITAL_COMMUNITY): Payer: Self-pay

## 2020-08-15 DIAGNOSIS — Z510 Encounter for antineoplastic radiation therapy: Secondary | ICD-10-CM | POA: Diagnosis not present

## 2020-08-15 DIAGNOSIS — F17210 Nicotine dependence, cigarettes, uncomplicated: Secondary | ICD-10-CM | POA: Insufficient documentation

## 2020-08-15 DIAGNOSIS — Z79899 Other long term (current) drug therapy: Secondary | ICD-10-CM | POA: Insufficient documentation

## 2020-08-15 DIAGNOSIS — C159 Malignant neoplasm of esophagus, unspecified: Secondary | ICD-10-CM

## 2020-08-15 HISTORY — PX: IR IMAGING GUIDED PORT INSERTION: IMG5740

## 2020-08-15 MED ORDER — LIDOCAINE-EPINEPHRINE 1 %-1:100000 IJ SOLN
INTRAMUSCULAR | Status: AC | PRN
  Administered 2020-08-15 (×2): 10 mL via INTRADERMAL

## 2020-08-15 MED ORDER — SODIUM CHLORIDE 0.9 % IV SOLN: INTRAVENOUS | Status: DC

## 2020-08-15 MED ORDER — MIDAZOLAM HCL 2 MG/2ML IJ SOLN
INTRAMUSCULAR | Status: AC
  Filled 2020-08-15: qty 2

## 2020-08-15 MED ORDER — HEPARIN SOD (PORK) LOCK FLUSH 100 UNIT/ML IV SOLN
INTRAVENOUS | Status: AC | PRN
  Administered 2020-08-15: 500 [IU] via INTRAVENOUS

## 2020-08-15 MED ORDER — FENTANYL CITRATE (PF) 100 MCG/2ML IJ SOLN
INTRAMUSCULAR | Status: AC
  Filled 2020-08-15: qty 2

## 2020-08-15 MED ORDER — HEPARIN SOD (PORK) LOCK FLUSH 100 UNIT/ML IV SOLN
INTRAVENOUS | Status: AC
  Filled 2020-08-15: qty 5

## 2020-08-15 MED ORDER — MIDAZOLAM HCL 2 MG/2ML IJ SOLN
INTRAMUSCULAR | Status: AC | PRN
  Administered 2020-08-15: 1 mg via INTRAVENOUS

## 2020-08-15 MED ORDER — LIDOCAINE-EPINEPHRINE 1 %-1:100000 IJ SOLN
INTRAMUSCULAR | Status: AC
  Filled 2020-08-15: qty 1

## 2020-08-15 MED ORDER — FENTANYL CITRATE (PF) 100 MCG/2ML IJ SOLN
INTRAMUSCULAR | Status: AC | PRN
  Administered 2020-08-15: 50 ug via INTRAVENOUS

## 2020-08-15 NOTE — Discharge Instructions (Signed)
Urgent needs - Interventional Radiology on call MD [phone]  Wound - May remove dressing and shower in 24 to 48 hours.  Keep site clean and dry.  Replace with bandaid as needed.  Do not submerge in tub or water until site healing well. If closed with glue, glue will flake off on its own.  If ordered by your provider, may start Emla cream in 2 weeks or after incision is healed.  After completion of treatment, your provider should have you set up for monthly port flushes.   

## 2020-08-15 NOTE — Consult Note (Signed)
Chief Complaint: Patient was seen in consultation today for Port-A-Cath placement  Referring Physician(s): Dorsey,John T IV  Supervising Physician: Mugweru,J  Patient Status: [facility] - Out-pt  History of Present Illness: Christian Newton is a 60 y.o. 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  Past Medical History:  Diagnosis Date   Cat bite of left hand 05/11/2015   COPD (chronic obstructive pulmonary disease) (HCC)    Malignant neoplasm of middle third of esophagus ([place]) 07/31/2020   Smoker     Past Surgical History:  Procedure Laterality Date   APPENDECTOMY     BIOPSY  07/18/2020   Procedure: BIOPSY;  Surgeon: Mauri Pole, MD;  Location: [facility];  Service: Endoscopy;;   ESOPHAGOGASTRODUODENOSCOPY (EGD) WITH PROPOFOL N/A 07/18/2020   Procedure: ESOPHAGOGASTRODUODENOSCOPY (EGD) WITH PROPOFOL;  Surgeon: Mauri Pole, MD;  Location: [place] [facility];  Service: Endoscopy;  Laterality: N/A;   I & D EXTREMITY Left 05/13/2015   Procedure: INCISION AND DRAINAGE OF LEFT EXTREMITY;  Surgeon: Charlotte Crumb, MD;  Location: [place];  Service: Orthopedics;  Laterality: Left;   INCISION AND DRAINAGE Left 05/13/2015    Allergies: Patient has no known allergies.  Medications: Prior to Admission medications   Medication Sig Start Date End Date Taking? Authorizing Provider  acetaminophen (TYLENOL) 500 MG tablet Take 500 mg by mouth every 6 (six) hours as needed for mild pain or headache.    [provider]  dexamethasone (DECADRON) 4 MG tablet Take 1 tablet (4 mg total) by mouth 2 (two) times daily. 08/02/20   Hayden Pedro, PA-C  food thickener (SIMPLYTHICK, NECTAR/LEVEL 2/MILDLY THICK,) GEL Take 1 packet by mouth as needed. 07/20/20   Donne Hazel, MD  metoprolol tartrate (LOPRESSOR) 25 MG tablet Take 1 tablet (25 mg total) by mouth 2 (two)  times daily. 07/28/20 08/27/20  Orson Slick, MD  oxyCODONE (OXY IR/ROXICODONE) 5 MG immediate release tablet Take 1 tablet (5 mg total) by mouth every 6 (six) hours as needed for moderate pain or breakthrough pain. 07/28/20   Orson Slick, MD  pantoprazole (PROTONIX) 40 MG tablet Take 1 tablet (40 mg total) by mouth daily. 08/02/20   Hayden Pedro, PA-C  albuterol (PROVENTIL HFA;VENTOLIN HFA) 108 (90 BASE) MCG/ACT inhaler Inhale 2 puffs into the lungs every 6 (six) hours as needed for wheezing or shortness of breath. 10/21/14 05/12/15  Velvet Bathe, MD     No family history on file.  Social History   Socioeconomic History   Marital status: Single    Spouse name: Not on file   Number of children: Not on file   Years of education: Not on file   Highest education level: Not on file  Occupational History   Not on file  Tobacco Use   Smoking status: Every Day    Packs/day: 0.25    Years: 20.00    Pack years: 5.00    Types: Cigarettes   Smokeless tobacco: Never  Vaping Use   Vaping Use: Never used  Substance and Sexual Activity   Alcohol use: No   Drug use: No   Sexual activity: Not on file  Other Topics Concern   Not on file  Social History Narrative   Not on file   Social Determinants of Health   Financial Resource Strain: High Risk   Difficulty of Paying Living Expenses: Very hard  Food Insecurity: Food  Insecurity Present   Worried About Charity fundraiser in the Last Year: Sometimes true   Ran Out of Food in the Last Year: Sometimes true  Transportation Needs: Unmet Transportation Needs   Lack of Transportation (Medical): Yes   Lack of Transportation (Non-Medical): Yes  Physical Activity: Not on file  Stress: Stress Concern Present   Feeling of Stress : To some extent  Social Connections: Unknown   Frequency of Communication with Friends and Family: Twice a week   Frequency of Social Gatherings with Friends and Family: Once a week   Attends Religious  Services: Never   Printmaker: Not on file   Attends Archivist Meetings: Not on file   Marital Status: Not on file      Review of Systems currently denies fever, headache, worsening dyspnea, cough, nausea, vomiting or bleeding.  He has had weight loss, dysphagia, lower anterior chest/epigastric discomfort, back pain  Vital Signs: BP 124/90   Pulse 86   Temp 97.7 F (36.5 C) (Oral)   Resp 18   SpO2 98%   Physical Exam awake, alert.  Chest with slightly diminished breath sounds left base, right clear.  Heart with regular rate and rhythm.  Abdomen soft, positive bowel sounds, nontender.  No lower extremity edema.  Imaging: DG Chest 2 View  Result Date: 07/16/2020 CLINICAL DATA:  Left-sided chest pain and back pain EXAM: CHEST - 2 VIEW COMPARISON:  10/20/2014 FINDINGS: The heart size and mediastinal contours are within normal limits. Pulmonary hyperinflation. The visualized skeletal structures are unremarkable. IMPRESSION: Pulmonary hyperinflation without acute abnormality of the lungs. Electronically Signed   By: Eddie Candle M.D.   On: 07/16/2020 20:14   MR ABDOMEN W WO CONTRAST  Result Date: 07/18/2020 CLINICAL DATA:  Suspected liver metastasis in the setting of pancreatic neoplasm. EXAM: MRI ABDOMEN WITHOUT AND WITH CONTRAST TECHNIQUE: Multiplanar multisequence MR imaging of the abdomen was performed both before and after the administration of intravenous contrast. CONTRAST:  30m GADAVIST GADOBUTROL 1 MMOL/ML IV SOLN COMPARISON:  CT evaluation of July 16, 2020. FINDINGS: Lower chest: Diffuse esophageal thickening seen on recent CT of the chest, abdomen and pelvis is not evaluated. There is no dense consolidation. Small bilateral pleural effusions are present. Hepatobiliary: Hepatic iron deposition makes liver assessment limited as does the presence of respiratory motion. There are multiple hepatic lesions. Lesions in terms of enhancement are not well  evaluated due to the presence of background signal within the vasculature related to recent Feraheme infusion based on appearance. For this reason the pancreas is also not well evaluated on precontrast imaging. The portal vein is narrowed about the pancreas, main portal vein. The SMV appears patent. Narrowing of the portal vein is in the mid portal vein adjacent to the large mass which is centered in the hepatogastric recess more than in the pancreatic head. Hepatic lesions, dominant lesion in the LEFT hepatic lobe measuring 3.4 x 2.8 cm without signal to suggest cyst, subtraction images with enhancement pattern compatible with metastatic lesion on subtraction. Motion degraded study. No pericholecystic stranding.  No biliary duct dilation. The lesion in the anterior LEFT hemi liver, hepatic subsegment II (image 14 of series 19) also likely a metastatic focus. The presence of profound iron deposition in the liver again limiting assessment of these areas. Other areas of diffusion related signal in the hepatic parenchyma at least 4 additional small areas, some in the RIGHT hepatic lobe largest near the IVC confluence on  image 78 of series 10 measures 14 mm. Inferior LEFT hepatic lobe lesion seen on previous CT approximately 2 cm (image 19/5) also suspicious for metastasis. Pancreas: Pancreas also showing very low intrinsic "pre contrast" T1 signal compatible with iron deposition likely related to recent Feraheme administration. Large mass adjacent to or arising from the pancreas in the gastrohepatic region tracking into the hepato gastric recess measuring as much as 5 x 5.2 cm (image 47/10). No pancreatic ductal dilation despite large mass in the region. Spleen:  Spleen normal size and contour, no focal lesion. Adrenals/Urinary Tract:  Adrenal glands are normal. Renal cysts bilaterally. Stomach/Bowel: No acute gastrointestinal process to the extent evaluated. Esophageal abnormalities not visualized on CT.  Vascular/Lymphatic: Portal venous narrowing in the mid portal vein, high-grade narrowing. Signal remains within the portal vein and within the SMV. No adenopathy below the renal veins. Celiac adenopathy and RIGHT juxta crural nodal enlargement behind the inferior vena cava nearly contiguous with large mass region of the gastrohepatic ligament. Other:  Trace ascites. Musculoskeletal: No suspicious bone lesions identified. But signs of diffuse iron deposition. IMPRESSION: Signs of malignancy with mass in the region of the hepatogastric recess near the pancreatic head that narrows vascular structures and is associated with hepatic metastatic disease. Exam limited by recent presumed administration of Feraheme which confounds assessment. 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 Pancreatic neoplasm is felt less likely given the lack of peripheral ductal dilation though assessment is quite limited. Consider upper endoscopy and endoscopic ultrasound evaluation for further assessment. Further staging should be performed with CT for at least 60-90 days given the recent iron infusion. Would defer decision for the type per protocol to be utilized until after the endoscopy. Electronically Signed   By: Zetta Bills M.D.   On: 07/18/2020 07:48   NM PET Image Initial (PI) Skull Base To Thigh  Result Date: 08/13/2020 CLINICAL DATA:  Initial treatment strategy for esophageal cancer. EXAM: NUCLEAR MEDICINE PET SKULL BASE TO THIGH TECHNIQUE: 5.8 mCi F-18 FDG was injected intravenously. Full-ring PET imaging was performed from the skull base to thigh after the radiotracer. CT data was obtained and used for attenuation correction and anatomic localization. Fasting blood glucose: 101 mg/dl COMPARISON:  Multiple exams, including CT scan 07/16/2020 and MRI from 07/17/2020 FINDINGS: Mediastinal blood pool activity: SUV max 2.1 Liver activity: SUV max 3.0 NECK: No  significant abnormal hypermetabolic activity in this region. Incidental CT findings: Bilateral common carotid atherosclerotic calcification. CHEST: Bilateral supraclavicular, bilateral paratracheal, and distal paraesophageal hypermetabolic adenopathy. A left paratracheal/paraesophageal lymph node measuring 2.6 cm in short axis on image 63 of series 4 has maximum SUV of 14.0. Right upper paratracheal node 1.5 cm in short axis on image 53 series 4, maximum SUV 16.9. Right supraclavicular node 1.1 cm in short axis on image 45 series 4, maximum SUV 11.5. The circumferential mass of the mid esophagus at about the level of the carina has maximum SUV of 25.6. There is some ground-glass opacity in the right middle lobe which is likely inflammatory based on morphology, maximum SUV 2.8. Incidental CT findings: Coronary, aortic arch, and branch vessel atherosclerotic vascular disease. ABDOMEN/PELVIS: There are about 10 hypermetabolic masses in the left hepatic lobe. Index lesion posteriorly in segment 2 measures about 3.6 by 2.4 cm on image 108 series 4, maximum SUV 15.2. A 2.0 by 1.5 cm oval-shaped hypermetabolic mass inferiorly in the right hepatic lobe has maximum SUV of 12.9. Hypermetabolic mass  of the porta hepatis and upper margin of the pancreatic head previously shown to be encasing the common hepatic artery, associated hypermetabolic activity measures about 6.7 by 4.2 cm and has a maximum SUV of 13.9. Small hypermetabolic left celiac lymph node measuring about 0.4 cm in short axis on image 115 series 4, maximum SUV 6.1. Incidental CT findings: Aortoiliac atherosclerotic vascular disease. Sigmoid colon diverticulosis. SKELETON: Large lytic mass of the T4 vertebral body, left pedicle, and left posterior elements with associated hypermetabolic activity measuring about 4.6 by 2.8 cm, maximum SUV 25.2, compatible with malignancy. High suspicion for dural involvement, cord impingement cannot be excluded. Small focus of  accentuated activity at the L2-3 interspinous space, maximum SUV 4.5, more likely to be degenerative than neoplastic. Incidental CT findings: none IMPRESSION: 1. Large lytic metastatic lesion in the T4 vertebra involving the vertebral body and left posterior elements, dural invasion is likely and cord impingement is not excluded. Dedicated thoracic spine MRI with and without contrast recommended for further characterization, if the patient has neurologic symptoms then this should be performed on an urgent basis. 2. Hypermetabolic mass of the mid esophagus with substantial hypermetabolic mediastinal adenopathy. 3. Hypermetabolic masses in the liver, especially concentrated in the left hepatic lobe, with a mass in the porta hepatis which cannot be separated from the pancreatic head and which could be arising from the pancreas, likely envelop ping the hepatic artery. Synchronous pancreatic malignancy cannot be excluded although this could be a metastatic lesion. 4. Small focus of activity at the L3-4 interspinous space is probably degenerative. 5. Other imaging findings of potential clinical significance: Aortic Atherosclerosis (ICD10-I70.0). Coronary atherosclerosis. Sigmoid colon diverticulosis. Electronically Signed   By: Van Clines M.D.   On: 08/13/2020 10:56   DG Swallowing Func-Speech Pathology  Result Date: 07/20/2020 Formatting of this result is different from the original. Objective Swallowing Evaluation: Type of Study: MBS-Modified Barium Swallow Study  Patient Details Name: Christian Newton MRN: [id number] Date of Birth: 1960/04/24 Today's Date: 07/20/2020 Time: SLP Start Time (ACUTE ONLY): 0940 -SLP Stop Time (ACUTE ONLY): 1000 SLP Time Calculation (min) (ACUTE ONLY): 20 min Past Medical History: Past Medical History: Diagnosis Date  Cat bite of left hand 05/11/2015  COPD (chronic obstructive pulmonary disease) ([place])   Smoker  Past Surgical History: Past Surgical History: Procedure Laterality Date   APPENDECTOMY    I & D EXTREMITY Left 05/13/2015  Procedure: INCISION AND DRAINAGE OF LEFT EXTREMITY;  Surgeon: Charlotte Crumb, MD;  Location: [facility];  Service: Orthopedics;  Laterality: Left;  INCISION AND DRAINAGE Left 05/13/2015 HPI: 60 year old male with a history of tobacco use, admitted to the hospital with complaints of chest discomfort and shortness of breath.  Described persistent chest pain radiating to his back for the past 2 weeks.  He is also had unintentional weight loss, odynophagia, coughing with all POs  CT scan indicated esophageal thickening.  Seen by GI and underwent EGD that indicated esophageal mass.  Further imaging also indicates possible pancreatic mass and hepatic metastases.  Currently biopsies are pending.  Subjective: alert, pleasant upright in chair at bedside Assessment / Plan / Recommendation CHL IP CLINICAL IMPRESSIONS 07/20/2020 Clinical Impression Pt demonstrates moderate oropharyngeal dysphagia. Primary problem is decreased laryngeal closure with aspiration during the swallow. Attempted moderating bolus size, which is helpful with small single swallows. Otherwise chin tuck, breath hold, head turn left and right all ineffective in preventing aspiration. Pt is hoarse, there is concern for abnormality of larynx. Cough is ineffective to clear aspirate  and cough appears quite irritating and distressing to pt. Pt tolerated soft solids (edentulous) and nectar thick liquids with only trace penetration. Will initiate dys 3/nectar. Pt may benefit from ENT to ensure no additional laryngeal abnormality.Pt will need f/u with OP SLP depending on treatment plan with oncology. SLP Visit Diagnosis Dysphagia, oropharyngeal phase (R13.12) Attention and concentration deficit following -- Frontal lobe and executive function deficit following -- Impact on safety and function Moderate aspiration risk   CHL IP TREATMENT RECOMMENDATION 07/20/2020 Treatment Recommendations Therapy as outlined in treatment  plan below   Prognosis 07/20/2020 Prognosis for Safe Diet Advancement Fair Barriers to Reach Goals Severity of deficits Barriers/Prognosis Comment -- CHL IP DIET RECOMMENDATION 07/20/2020 SLP Diet Recommendations Dysphagia 3 (Mech soft) solids;Nectar thick liquid Liquid Administration via Cup;Straw Medication Administration Whole meds with liquid Compensations Slow rate;Small sips/bites;Clear throat intermittently Postural Changes Seated upright at 90 degrees   CHL IP OTHER RECOMMENDATIONS 07/20/2020 Recommended Consults -- Oral Care Recommendations Oral care BID Other Recommendations --   CHL IP FOLLOW UP RECOMMENDATIONS 07/20/2020 Follow up Recommendations Outpatient SLP   CHL IP FREQUENCY AND DURATION 07/20/2020 Speech Therapy Frequency (ACUTE ONLY) min 2x/week Treatment Duration 2 weeks      CHL IP ORAL PHASE 07/20/2020 Oral Phase WFL Oral - Pudding Teaspoon -- Oral - Pudding Cup -- Oral - Honey Teaspoon -- Oral - Honey Cup -- Oral - Nectar Teaspoon -- Oral - Nectar Cup -- Oral - Nectar Straw -- Oral - Thin Teaspoon -- Oral - Thin Cup -- Oral - Thin Straw -- Oral - Puree -- Oral - Mech Soft -- Oral - Regular -- Oral - Multi-Consistency -- Oral - Pill -- Oral Phase - Comment --  CHL IP PHARYNGEAL PHASE 07/20/2020 Pharyngeal Phase Impaired Pharyngeal- Pudding Teaspoon -- Pharyngeal -- Pharyngeal- Pudding Cup -- Pharyngeal -- Pharyngeal- Honey Teaspoon -- Pharyngeal -- Pharyngeal- Honey Cup -- Pharyngeal -- Pharyngeal- Nectar Teaspoon -- Pharyngeal -- Pharyngeal- Nectar Cup Reduced airway/laryngeal closure;Penetration/Aspiration during swallow Pharyngeal Material enters airway, CONTACTS cords and not ejected out;Material does not enter airway Pharyngeal- Nectar Straw -- Pharyngeal -- Pharyngeal- Thin Teaspoon -- Pharyngeal -- Pharyngeal- Thin Cup Penetration/Aspiration during swallow;Reduced airway/laryngeal closure Pharyngeal Material enters airway, passes BELOW cords and not ejected out despite cough attempt by  patient;Material enters airway, CONTACTS cords and not ejected out Pharyngeal- Thin Straw Reduced airway/laryngeal closure;Penetration/Aspiration during swallow Pharyngeal Material enters airway, CONTACTS cords and not ejected out;Material enters airway, passes BELOW cords and not ejected out despite cough attempt by patient Pharyngeal- Puree WFL Pharyngeal -- Pharyngeal- Mechanical Soft WFL Pharyngeal -- Pharyngeal- Regular -- Pharyngeal -- Pharyngeal- Multi-consistency -- Pharyngeal -- Pharyngeal- Pill WFL Pharyngeal -- Pharyngeal Comment --  No flowsheet data found. DeBlois, Katherene Ponto 07/20/2020, 10:39 AM              CT Angio Chest/Abd/Pel for Dissection W and/or Wo Contrast  Result Date: 07/16/2020 CLINICAL DATA:  Abdominal pain, aortic dissection suspected EXAM: CT ANGIOGRAPHY CHEST, ABDOMEN AND PELVIS TECHNIQUE: Non-contrast CT of the chest was initially obtained. Multidetector CT imaging through the chest, abdomen and pelvis was performed using the standard protocol during bolus administration of intravenous contrast. Multiplanar reconstructed images and MIPs were obtained and reviewed to evaluate the vascular anatomy. CONTRAST:  139m OMNIPAQUE IOHEXOL 350 MG/ML SOLN COMPARISON:  CT angiography chest 10/20/2014 FINDINGS: CTA CHEST FINDINGS Cardiovascular: Satisfactory opacification of the pulmonary arteries to the segmental level. No evidence of pulmonary embolism. Normal heart size. No pericardial effusion. Mediastinum/Nodes: Limited evaluation for  mediastinal lymphadenopathy due to timing of contrast. Question subcarinal lymph node. No enlarged hilar or axillary lymph nodes. Thyroid gland and trachea demonstrate no significant findings. Marked diffuse esophageal wall thickening and haziness. Associated posterior mediastinal haziness and soft tissue density. Soft tissue density in haziness abuts the posterior trachea and carina. Lungs/Pleura: No pulmonary nodule. No pulmonary mass. Thin  consolidation along the pleura within the posterior right middle lobe. No pleural effusion. No pneumothorax. Musculoskeletal: No chest wall abnormality. No suspicious lytic or blastic osseous lesions. No acute displaced fracture. Multilevel degenerative changes of the spine. Review of the MIP images confirms the above findings. CTA ABDOMEN AND PELVIS FINDINGS VASCULAR Aorta: At least moderate atherosclerotic plaque. Normal caliber aorta without aneurysm, dissection, vasculitis or significant stenosis. Celiac: Mild atherosclerotic plaque. Patent without evidence of aneurysm, dissection, vasculitis or significant stenosis. SMA: Mild atherosclerotic plaque. Patent without evidence of aneurysm, dissection, vasculitis or significant stenosis. Renals: Both renal arteries are patent without evidence of aneurysm, dissection, vasculitis, fibromuscular dysplasia or significant stenosis. IMA: Patent without evidence of aneurysm, dissection, vasculitis or significant stenosis. Inflow: At least moderate atherosclerotic plaque. Patent without evidence of aneurysm, dissection, vasculitis or significant stenosis. Veins: No obvious venous abnormality within the limitations of this arterial phase study. Review of the MIP images confirms the above findings. NON-VASCULAR Hepatobiliary: There is a 1.5 cm hypodensity within the left hepatic lobe (7:128) as well as a poorly defined 3.1 cm hypodensity within the inferior left lateral hepatic lobe (7:168). No gallstones, gallbladder wall thickening, or pericholecystic fluid. No biliary dilatation. Pancreas: Question 1.6 cm proximal pancreatic mass with markedly limited evaluation due to timing of contrast. Spleen: Normal in size without focal abnormality. Adrenals/Urinary Tract: No adrenal nodule bilaterally. Bilateral kidneys enhance symmetrically. There is a 1.3 cm fluid density lesion within the left kidney that likely represents a simple renal cyst. Subcentimeter hypodensities are too  small to characterize. No hydronephrosis. No hydroureter. The urinary bladder is unremarkable. Stomach/Bowel: Stomach is within normal limits. No evidence of bowel wall thickening or dilatation. Lymphatic: No definite lymphadenopathy. Reproductive: Prostate is unremarkable. Other: No intraperitoneal free fluid. No intraperitoneal free gas. No organized fluid collection. Musculoskeletal: No abdominal wall hernia or abnormality. No suspicious lytic or blastic osseous lesions. No acute displaced fracture. Multilevel degenerative changes of the spine. Review of the MIP images confirms the above findings. IMPRESSION: 1. No acute vascular abnormality. 2. Marked diffuse esophageal wall thickening and haziness as well as surrounding posterior mediastinal haziness/fat stranding. Findings concerning for malignancy versus inflammation. Recommend direct visualization. 3. Mediastinal lymphadenopathy. 4. Question 1.6 cm proximal pancreatic mass with markedly limited evaluation due to timing of contrast. Concern for malignancy. Recommend MRI pancreatic protocol for further evaluation. 5. Indeterminate 1.5 cm as well as another 3.1 cm poorly defined left hepatic lobe hypodensity. These finding can be further evaluated on MRI pancreatic protocol. Electronically Signed   By: Iven Finn M.D.   On: 07/16/2020 23:42    Labs:  CBC: Recent Labs    07/16/20 1855 07/17/20 0437 07/18/20 0305 07/28/20 1039  WBC 8.6 9.0 8.5 8.1  HGB 11.7* 11.3* 12.6* 12.6*  HCT 35.0* 34.0* 38.2* 37.6*  PLT 163 163 165 362    COAGS: No results for input(s): INR, APTT in the last 8760 hours.  BMP: Recent Labs    07/16/20 1855 07/17/20 0437 07/18/20 0305 07/28/20 1039  NA 136 137 137 137  K 3.8 4.0 4.6 5.0  CL 101 101 99 101  CO2 '27 24 27 '$ 26  GLUCOSE 109* 99 111* 107*  BUN '13 11 14 17  '$ CALCIUM 9.1 9.0 9.4 9.9  CREATININE 1.22 1.03 1.14 1.49*  GFRNONAA >60 >60 >60 53*    LIVER FUNCTION TESTS: Recent Labs     07/16/20 1855 07/17/20 0437 07/28/20 1039  BILITOT 1.1 1.0 0.7  AST <5* 10* 10*  ALT '9 8 9  '$ ALKPHOS 53 50 60  PROT 7.0 6.5 7.7  ALBUMIN 3.6 3.3* 3.5    TUMOR MARKERS: No results for input(s): AFPTM, CEA, CA199, CHROMGRNA in the last 8760 hours.  Assessment and Plan: 60 y.o. 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  All of the patient's questions were answered, patient is agreeable to proceed. Consent signed and in chart.    Thank you for this interesting consult.  I greatly enjoyed meeting Ethanjacob Polis and look forward to participating in their care.  A copy of this report was sent to the requesting provider on this date.  Electronically Signed: D. Rowe Robert, PA-C 08/15/2020, 1:32 PM   I spent a total of  25 minutes   in face to face in clinical consultation, greater than 50% of which was counseling/coordinating care for Port-A-Cath placement

## 2020-08-15 NOTE — Procedures (Addendum)
Vascular and Interventional Radiology Procedure Note  Patient: Christian Newton DOB: 01-24-1960 Medical Record Number: [id number] Note Date/Time: 08/15/20 3:28 PM   Performing Physician: Michaelle Birks, MD Assistant(s): None  Diagnosis:  Esophageal CA  Procedure: PORT PLACEMENT  Anesthesia: Conscious Sedation Complications: None Estimated Blood Loss: Minimal  Findings:  Successful right-sided port placement, with the tip of the catheter in the proximal right atrium.  Plan: Catheter ready for use.  See detailed procedure note with images in PACS. The patient tolerated the procedure well without incident or complication and was returned to Recovery in stable condition.    Michaelle Birks, MD [facility]   Clinic: [phone]

## 2020-08-15 NOTE — Progress Notes (Addendum)
Pt unable to eat or drink while in short stay. No urine output noted after IVF NS.   Pt states he has run out of Lopressor and requested that I call his pharmacy to see if he can pick up the next perscription .  Pharmacist at [facility] states that 30 day perscription was picked up on 07-28-20 and should last thru 08-27-20, also there are no refills currently available.  Instructed Pt to follow up with his provider. Pt refuses liquid with thickner, verbalizes understanding of discharge instructions reviewed pre procedure and upcoming appointment schedule.

## 2020-08-16 ENCOUNTER — Ambulatory Visit
Admission: RE | Admit: 2020-08-16 | Discharge: 2020-08-16 | Disposition: A | Discharge status: Home / Self Care | Payer: Medicaid Other | Source: Ambulatory Visit | Attending: Radiation Oncology | Admitting: Radiation Oncology

## 2020-08-16 ENCOUNTER — Inpatient Hospital Stay: Payer: Medicaid Other | Admitting: Dietician

## 2020-08-16 ENCOUNTER — Inpatient Hospital Stay: Payer: Medicaid Other

## 2020-08-16 DIAGNOSIS — Z510 Encounter for antineoplastic radiation therapy: Secondary | ICD-10-CM | POA: Diagnosis not present

## 2020-08-16 NOTE — Progress Notes (Signed)
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 Patient currently receiving palliative radiation therapy. Plans to start systemic chemotherapy with FOLFOX, first treatment scheduled 8/9.   Met with patient in clinic. He reports he is tired. Patient states he is not resting well at night due to pain and unable to rest during the day because "I'm always here" Patient reports tolerating soft foods, finely cut meats. He is using thickener in liquids to nectar thick consistency. Patient reports he continues to drink Reason supplement, usually one daily. Patient says he is unable to drink more than this. He reports he has enough supplements at home and is not in need of food bag today.   Nutrition Focused Physical Exam: 7/26 findings:  Severe orbital, buccal, triceps fat depletion  Severe temporal, clavicle, acromion process, interosseous, quadriceps, gastrocnemius muscle depletions   Medications: Decadron, Oxycodone, Simplythick  Labs: 7/21 Glucose 101  Anthropometrics: Weight 113.2 lbs on 7/22 decreased from 117.4 lb on 7/15. Patient has lost 3.4% (4lbs) in 1 week. This is significant.   7/7 - 117 lb 9.6 oz 6/26 - 126 lb  Estimated Energy Needs  Kcals: 9941-2904 Protein: 85-96 Fluid: 1.7 L  NUTRITION DIAGNOSIS: Inadequate oral intake ongoing. Plans for feeding tube placement   MALNUTRITION DIAGNOSIS: Severe malnutrition in the context of chronic illness (metastatic esophageal cancer), dysphagia as evidenced by severe fat and muscle depletions, significant 10.3% (13 lb) weight loss in 1 month, intake less than 75% of estimated needs for greater than 1 month.    INTERVENTION:  Encouraged patient to increase intake of Reason supplement, recommended 3/day (455 kcal, 18 g protein each) Encouraged soft, moist high protein foods as able, pt politely declined food bag today Provided support and encouragement Feeding tube placement scheduled Friday  8/5 Will provide tube feeding education Oncology navigator aware of PEG care education needs  Once tube is placed recommend   Osmolite 1.5 - 6 cartons/day split over 4 feedings. Flush with 60 ml water before and after each feeding. Drink by mouth (nectar thick) or give via tube additional 2 cups water daily.  This provides 1775 kcal, 74.5 grams protein, 1865 ml total water     MONITORING, EVALUATION, GOAL: weight trends, intake   NEXT VISIT: Monday August 1 in clinic

## 2020-08-17 ENCOUNTER — Other Ambulatory Visit: Payer: Self-pay

## 2020-08-17 ENCOUNTER — Ambulatory Visit: Payer: Medicaid Other

## 2020-08-17 ENCOUNTER — Ambulatory Visit: Admission: RE | Admit: 2020-08-17 | Payer: Medicaid Other | Source: Ambulatory Visit

## 2020-08-17 DIAGNOSIS — Z510 Encounter for antineoplastic radiation therapy: Secondary | ICD-10-CM | POA: Diagnosis not present

## 2020-08-18 ENCOUNTER — Ambulatory Visit
Admission: RE | Admit: 2020-08-18 | Discharge: 2020-08-18 | Disposition: A | Discharge status: Home / Self Care | Payer: Medicaid Other | Source: Ambulatory Visit | Attending: Radiation Oncology | Admitting: Radiation Oncology

## 2020-08-18 DIAGNOSIS — Z510 Encounter for antineoplastic radiation therapy: Secondary | ICD-10-CM | POA: Diagnosis not present

## 2020-08-19 ENCOUNTER — Ambulatory Visit
Admission: RE | Admit: 2020-08-19 | Discharge: 2020-08-19 | Disposition: A | Discharge status: Home / Self Care | Payer: Medicaid Other | Source: Ambulatory Visit | Attending: Radiation Oncology | Admitting: Radiation Oncology

## 2020-08-19 ENCOUNTER — Other Ambulatory Visit: Payer: Self-pay

## 2020-08-19 ENCOUNTER — Other Ambulatory Visit: Payer: Self-pay | Admitting: Hematology and Oncology

## 2020-08-19 DIAGNOSIS — Z510 Encounter for antineoplastic radiation therapy: Secondary | ICD-10-CM | POA: Diagnosis not present

## 2020-08-19 MED ORDER — OXYCODONE HCL 5 MG PO TABS: 5.0000 mg | ORAL_TABLET | Freq: Four times a day (QID) | ORAL | 0 refills | Status: DC | PRN

## 2020-08-22 ENCOUNTER — Ambulatory Visit
Admission: RE | Admit: 2020-08-22 | Discharge: 2020-08-22 | Disposition: A | Discharge status: Home / Self Care | Payer: Medicaid Other | Source: Ambulatory Visit | Attending: Radiation Oncology | Admitting: Radiation Oncology

## 2020-08-22 ENCOUNTER — Encounter: Payer: Self-pay | Admitting: Radiation Oncology

## 2020-08-22 ENCOUNTER — Inpatient Hospital Stay: Payer: Medicaid Other | Attending: Hematology and Oncology | Admitting: Nutrition

## 2020-08-22 ENCOUNTER — Other Ambulatory Visit: Payer: Self-pay

## 2020-08-22 DIAGNOSIS — Z5111 Encounter for antineoplastic chemotherapy: Secondary | ICD-10-CM | POA: Insufficient documentation

## 2020-08-22 DIAGNOSIS — C154 Malignant neoplasm of middle third of esophagus: Secondary | ICD-10-CM | POA: Insufficient documentation

## 2020-08-22 DIAGNOSIS — C7951 Secondary malignant neoplasm of bone: Secondary | ICD-10-CM | POA: Insufficient documentation

## 2020-08-22 DIAGNOSIS — Z923 Personal history of irradiation: Secondary | ICD-10-CM | POA: Insufficient documentation

## 2020-08-22 DIAGNOSIS — Z931 Gastrostomy status: Secondary | ICD-10-CM | POA: Insufficient documentation

## 2020-08-22 DIAGNOSIS — G893 Neoplasm related pain (acute) (chronic): Secondary | ICD-10-CM | POA: Insufficient documentation

## 2020-08-22 DIAGNOSIS — F17210 Nicotine dependence, cigarettes, uncomplicated: Secondary | ICD-10-CM | POA: Insufficient documentation

## 2020-08-22 DIAGNOSIS — J449 Chronic obstructive pulmonary disease, unspecified: Secondary | ICD-10-CM | POA: Insufficient documentation

## 2020-08-22 DIAGNOSIS — Z510 Encounter for antineoplastic radiation therapy: Secondary | ICD-10-CM | POA: Insufficient documentation

## 2020-08-22 DIAGNOSIS — R1310 Dysphagia, unspecified: Secondary | ICD-10-CM | POA: Insufficient documentation

## 2020-08-22 DIAGNOSIS — C787 Secondary malignant neoplasm of liver and intrahepatic bile duct: Secondary | ICD-10-CM | POA: Insufficient documentation

## 2020-08-22 NOTE — Progress Notes (Signed)
I met with Christian Newton and reviewed PEG tube care.  I provided written information.  All questions were answered.  I told him I will follow up with him after PEG tube is placed.  He verbalized understanding

## 2020-08-22 NOTE — Progress Notes (Signed)
Nutrition follow-up completed with patient diagnosed with SCC of the esophagus with mets to liver, pancreas, and spine.  Patient declined being weighed as he is too weak to stand up.  Last weight documented was 113.2 pounds July 22.  Patient states he will drink 1 Reason nutrition supplement daily.  He declines drinking more.  He is using simply thick to thicken liquids and foods if needed.  Reports he is chopping food up very fine.  States he ate shrimp and fruit cocktail over the weekend.  I expect patient has had more weight loss.  Estimated nutrition needs: 1600-1865 cal, 85-96 g protein, 1.7 L fluid.  Nutrition diagnosis: Inadequate oral intake continues. Severe malnutrition continues.  Intervention: Encourage patient to try to increase oral intake this week and over the weekend until he can begin using feeding tube.  His feeding tube is scheduled for placement on Friday, August 5.  Typically patients are not advised to use this for 24 hours after placement.  Patient was educated today on strategies for using his feeding tube for nutrition support.  He was able to demonstrate free water flushes.  He declines using his feeding tube for nutrition support next weekend.  He prefers to take things 1 day at a time.  Provided sample case of Osmolite 1.5 and supplies.  Recommended patient try to increase oral intake and Reason to minimize further weight loss.  Patient may be at risk for refeeding syndrome secondary to weight loss, decreased oral intake and cancer diagnosis.  5 cartons Osmolite 1.5 with 60 mL free water flushes before and after feedings 4 times daily along with an additional 240 mL free water 3 times daily provides approximately 1775 cal, 74.5 g protein, 2105  mL free water.  I will wait to order tube feeding until after visit on Monday, August 8.  Monitoring, evaluation, goals: Patient will tolerate increased calories and protein to minimize further weight loss. Monitor labs for  refeeding syndrome.  Next visit: Monday, August 8 after MD.  **Disclaimer: This note was dictated with voice recognition software. Similar sounding words can inadvertently be transcribed and this note may contain transcription errors which may not have been corrected upon publication of note.**

## 2020-08-23 ENCOUNTER — Encounter: Payer: Self-pay | Admitting: Gastroenterology

## 2020-08-23 ENCOUNTER — Ambulatory Visit (INDEPENDENT_AMBULATORY_CARE_PROVIDER_SITE_OTHER): Payer: Self-pay | Admitting: Gastroenterology

## 2020-08-23 ENCOUNTER — Ambulatory Visit: Payer: Medicaid Other

## 2020-08-23 VITALS — BP 90/60 | HR 140 | Ht 67.0 in | Wt 106.0 lb

## 2020-08-23 DIAGNOSIS — C154 Malignant neoplasm of middle third of esophagus: Secondary | ICD-10-CM

## 2020-08-23 NOTE — Progress Notes (Signed)
08/23/2020 Christian Newton [id number] 24-May-1960   HISTORY OF PRESENT ILLNESS: 60 year old male recently diagnosed with squamous cell carcinoma of the esophagus.  He was seen last and underwent EGD during his hospital stay in June at which time this was diagnosed.  Plan from our standpoint was for follow-up as needed.  He is already receiving radiation, has his port in place, and is scheduled for a gastrostomy/feeding tube with IR on Friday.  His condition is unchanged.  He just expresses feeling tired.  Is drinking small amounts and eating very small amounts of soft food.   Past Medical History:  Diagnosis Date   Cat bite of left hand 05/11/2015   COPD (chronic obstructive pulmonary disease) (HCC)    Malignant neoplasm of middle third of esophagus ([place]) 07/31/2020   Smoker    Past Surgical History:  Procedure Laterality Date   APPENDECTOMY     BIOPSY  07/18/2020   Procedure: BIOPSY;  Surgeon: Mauri Pole, MD;  Location: [facility];  Service: Endoscopy;;   ESOPHAGOGASTRODUODENOSCOPY (EGD) WITH PROPOFOL N/A 07/18/2020   Procedure: ESOPHAGOGASTRODUODENOSCOPY (EGD) WITH PROPOFOL;  Surgeon: Mauri Pole, MD;  Location: [facility];  Service: Endoscopy;  Laterality: N/A;   I & D EXTREMITY Left 05/13/2015   Procedure: INCISION AND DRAINAGE OF LEFT EXTREMITY;  Surgeon: Charlotte Crumb, MD;  Location: [facility];  Service: Orthopedics;  Laterality: Left;   INCISION AND DRAINAGE Left 05/13/2015   IR IMAGING GUIDED PORT INSERTION  08/15/2020    reports that he has been smoking cigarettes. He has a 5.00 pack-year smoking history. He has never used smokeless tobacco. He reports that he does not drink alcohol and does not use drugs. family history is not on file. No Known Allergies    Outpatient Encounter Medications as of 08/23/2020  Medication Sig   acetaminophen (TYLENOL) 500 MG tablet Take 500 mg by mouth every 6 (six) hours as needed for mild pain or headache.   dexamethasone  (DECADRON) 4 MG tablet Take 1 tablet (4 mg total) by mouth 2 (two) times daily.   food thickener (SIMPLYTHICK, NECTAR/LEVEL 2/MILDLY THICK,) GEL Take 1 packet by mouth as needed.   metoprolol tartrate (LOPRESSOR) 25 MG tablet Take 1 tablet (25 mg total) by mouth 2 (two) times daily.   oxyCODONE (OXY IR/ROXICODONE) 5 MG immediate release tablet Take 1 tablet (5 mg total) by mouth every 6 (six) hours as needed for moderate pain or breakthrough pain.   pantoprazole (PROTONIX) 40 MG tablet Take 1 tablet (40 mg total) by mouth daily.   [DISCONTINUED] albuterol (PROVENTIL HFA;VENTOLIN HFA) 108 (90 BASE) MCG/ACT inhaler Inhale 2 puffs into the lungs every 6 (six) hours as needed for wheezing or shortness of breath.   No facility-administered encounter medications on file as of 08/23/2020.     REVIEW OF SYSTEMS  : All other systems reviewed and negative except where noted in the History of Present Illness.   PHYSICAL EXAM: BP 90/60   Pulse (!) 140   Ht '5\' 7"'$  (1.702 m)   Wt 106 lb (48.1 kg)   BMI 16.60 kg/m  General:  Frail AA male in no acute distress; in WC Head: Normocephalic and atraumatic Eyes:  Sclerae anicteric, conjunctiva pink. Ears: Normal auditory acuity Lungs: Clear throughout to auscultation; no W/R/R. Heart:  Tachy, no M/R/G. Abdomen: Soft, non-distended.  BS present.  Non-tender. Musculoskeletal: Symmetrical with no gross deformities  Skin: No lesions on visible extremities. Decreased skin tugor. Extremities: No edema  Neurological:  Alert oriented x 4, grossly non-focal Psychological:  Alert and cooperative. Normal mood and affect  ASSESSMENT AND PLAN: *Squamous cell cancer of the esophagus: Patient's condition is unchanged.  He is already receiving radiation, has his port in place, and is scheduled for a gastrostomy tube/feeding tube on Friday with interventional radiology.  Upon our sign off when we saw him in the hospital and performed the EGD we had said that he could  follow-up here as needed.  Looks like this appointment got scheduled for a follow-up.  Nothing new to add from a GI standpoint.  Advised him to try to remain hydrated, sipping on liquids throughout the day including Ensure and protein drinks, etc.   CC:  No ref. provider found

## 2020-08-23 NOTE — Progress Notes (Signed)
Patient did not show for appointment.   

## 2020-08-23 NOTE — Progress Notes (Signed)
Pharmacist Chemotherapy Monitoring - Initial Assessment    Anticipated start date: 08/30/20   The following has been reviewed per standard work regarding the patient's treatment regimen: The patient's diagnosis, treatment plan and drug doses, and organ/hematologic function Lab orders and baseline tests specific to treatment regimen  The treatment plan start date, drug sequencing, and pre-medications Prior authorization status  Patient's documented medication list, including drug-drug interaction screen and prescriptions for anti-emetics and supportive care specific to the treatment regimen The drug concentrations, fluid compatibility, administration routes, and timing of the medications to be used The patient's access for treatment and lifetime cumulative dose history, if applicable  The patient's medication allergies and previous infusion related reactions, if applicable   Changes made to treatment plan:  N/A  Follow up needed:  N/A   Larene Beach, RPH, 08/23/2020  7:56 AM

## 2020-08-23 NOTE — Patient Instructions (Addendum)
It was my pleasure to provide care to you today. Based on our discussion, I am providing you with my recommendations below:  RECOMMENDATION(S):   I did not make any changes to your current regimen We will plan to repeat your Upper Endoscopy in 2023  FOLLOW UP:  Please follow up with our office as needed  BMI:  If you are age 62 or younger, your body mass index should be between 19-25. Your There is no height or weight on file to calculate BMI. If this is out of the aformentioned range listed, please consider follow up with your Primary Care Provider.   MY CHART:  The [place] [facility] providers would like to encourage you to use [facility] to communicate with providers for non-urgent requests or questions.  Due to long hold times on the telephone, sending your provider a message by [facility] may be a faster and more efficient way to get a response.  Please allow 48 business hours for a response.  Please remember that this is for non-urgent requests.   Thank you for trusting me with your gastrointestinal care!    Alonza Bogus, PA-C

## 2020-08-24 ENCOUNTER — Ambulatory Visit: Payer: Medicaid Other

## 2020-08-24 ENCOUNTER — Other Ambulatory Visit: Payer: Self-pay | Admitting: Hematology and Oncology

## 2020-08-24 ENCOUNTER — Encounter: Payer: Self-pay | Admitting: Family

## 2020-08-24 DIAGNOSIS — J9601 Acute respiratory failure with hypoxia: Secondary | ICD-10-CM

## 2020-08-24 DIAGNOSIS — R079 Chest pain, unspecified: Secondary | ICD-10-CM

## 2020-08-24 DIAGNOSIS — R1310 Dysphagia, unspecified: Secondary | ICD-10-CM

## 2020-08-24 DIAGNOSIS — Z09 Encounter for follow-up examination after completed treatment for conditions other than malignant neoplasm: Secondary | ICD-10-CM

## 2020-08-24 DIAGNOSIS — Z7689 Persons encountering health services in other specified circumstances: Secondary | ICD-10-CM

## 2020-08-24 DIAGNOSIS — C159 Malignant neoplasm of esophagus, unspecified: Secondary | ICD-10-CM

## 2020-08-24 DIAGNOSIS — I10 Essential (primary) hypertension: Secondary | ICD-10-CM

## 2020-08-24 DIAGNOSIS — D649 Anemia, unspecified: Secondary | ICD-10-CM

## 2020-08-24 DIAGNOSIS — M549 Dorsalgia, unspecified: Secondary | ICD-10-CM

## 2020-08-25 ENCOUNTER — Other Ambulatory Visit: Payer: Self-pay | Admitting: Hospice

## 2020-08-25 ENCOUNTER — Other Ambulatory Visit: Payer: Self-pay

## 2020-08-25 ENCOUNTER — Encounter: Payer: Self-pay | Admitting: Nurse Practitioner

## 2020-08-25 ENCOUNTER — Ambulatory Visit: Payer: Medicaid Other

## 2020-08-25 ENCOUNTER — Other Ambulatory Visit: Payer: Self-pay | Admitting: Radiology

## 2020-08-25 ENCOUNTER — Ambulatory Visit (INDEPENDENT_AMBULATORY_CARE_PROVIDER_SITE_OTHER): Payer: Self-pay | Admitting: Nurse Practitioner

## 2020-08-25 ENCOUNTER — Encounter: Payer: Self-pay | Admitting: Hematology and Oncology

## 2020-08-25 VITALS — BP 97/68 | HR 131 | Temp 98.0°F | Wt 104.0 lb

## 2020-08-25 DIAGNOSIS — C154 Malignant neoplasm of middle third of esophagus: Secondary | ICD-10-CM

## 2020-08-25 DIAGNOSIS — R000 Tachycardia, unspecified: Secondary | ICD-10-CM

## 2020-08-25 DIAGNOSIS — Z131 Encounter for screening for diabetes mellitus: Secondary | ICD-10-CM

## 2020-08-25 LAB — POCT GLYCOSYLATED HEMOGLOBIN (HGB A1C): Hemoglobin A1C: 6.3 % — AB (ref 4.0–5.6)

## 2020-08-25 MED ORDER — CEFAZOLIN SODIUM-DEXTROSE 2-4 GM/100ML-% IV SOLN: 2.0000 g | Freq: Once | INTRAVENOUS | Status: DC

## 2020-08-25 NOTE — Progress Notes (Signed)
[address], [place]  [address] Phone:  [phone]   Fax:  [phone]   New Patient Office Visit  Subjective:  Patient ID: Christian Newton, male    DOB: 1960/11/16  Age: 60 y.o. MRN: [id number]  CC:  Chief Complaint  Patient presents with   Establish Care    No questions or concerns.    HPI Christian Newton presents to established care. He  has a past medical history of COPD (chronic obstructive pulmonary disease) ([place]), Malignant neoplasm of middle third of esophagus ([place]) (07/31/2020), and Smoker.   He was recently diagnosed with esophageal cancer. He has completed radiation treatments. He is going to have a gastrostomy tube placed on tomorrow. He reports that he is trying to eat. He admits that everything happened so fast.  He is heart rate has been elevated. He denies feeling palpitations. He does feel some chest pain. He reports that he is compliant with his metroprolol.  Past Medical History:  Diagnosis Date   Cat bite of left hand 05/11/2015   COPD (chronic obstructive pulmonary disease) (HCC)    Malignant neoplasm of middle third of esophagus ([place]) 07/31/2020   Smoker     Past Surgical History:  Procedure Laterality Date   APPENDECTOMY     BIOPSY  07/18/2020   Procedure: BIOPSY;  Surgeon: Mauri Pole, MD;  Location: [facility];  Service: Endoscopy;;   ESOPHAGOGASTRODUODENOSCOPY (EGD) WITH PROPOFOL N/A 07/18/2020   Procedure: ESOPHAGOGASTRODUODENOSCOPY (EGD) WITH PROPOFOL;  Surgeon: Mauri Pole, MD;  Location: [facility];  Service: Endoscopy;  Laterality: N/A;   I & D EXTREMITY Left 05/13/2015   Procedure: INCISION AND DRAINAGE OF LEFT EXTREMITY;  Surgeon: Charlotte Crumb, MD;  Location: [facility];  Service: Orthopedics;  Laterality: Left;   INCISION AND DRAINAGE Left 05/13/2015   IR GASTROSTOMY TUBE MOD SED  08/26/2020   IR IMAGING GUIDED PORT INSERTION  08/15/2020    History reviewed. No pertinent family  history.  Social History   Socioeconomic History   Marital status: Single    Spouse name: Not on file   Number of children: Not on file   Years of education: Not on file   Highest education level: Not on file  Occupational History   Not on file  Tobacco Use   Smoking status: Every Day    Packs/day: 0.25    Years: 20.00    Pack years: 5.00    Types: Cigarettes   Smokeless tobacco: Never  Vaping Use   Vaping Use: Never used  Substance and Sexual Activity   Alcohol use: No   Drug use: No   Sexual activity: Not on file  Other Topics Concern   Not on file  Social History Narrative   Not on file   Social Determinants of Health   Financial Resource Strain: High Risk   Difficulty of Paying Living Expenses: Very hard  Food Insecurity: Food Insecurity Present   Worried About Running Out of Food in the Last Year: Sometimes true   Ran Out of Food in the Last Year: Sometimes true  Transportation Needs: Unmet Transportation Needs   Lack of Transportation (Medical): Yes   Lack of Transportation (Non-Medical): Yes  Physical Activity: Not on file  Stress: Stress Concern Present   Feeling of Stress : To some extent  Social Connections: Unknown   Frequency of Communication with Friends and Family: Twice a week   Frequency of Social Gatherings with Friends and Family:  Once a week   Attends Religious Services: Never   Active Member of Clubs or Organizations: Not on file   Attends Archivist Meetings: Not on file   Marital Status: Not on file  Intimate Partner Violence: Not on file    ROS Review of Systems  Constitutional:  Positive for appetite change (decreased due to radiation) and unexpected weight change (Related to cancer and treatment).  HENT: Negative.    Eyes: Negative.   Respiratory: Negative.    Cardiovascular:  Positive for chest pain.  Gastrointestinal: Negative.   Endocrine: Negative.   Musculoskeletal:        Middle back pain    Skin: Negative.    Neurological:  Positive for weakness.   Objective:   Today's Vitals: BP 97/68 (BP Location: Left Arm, Patient Position: Sitting)   Pulse (!) 131   Temp 98 F (36.7 C)   Wt 104 lb 0.4 oz (47.2 kg)   SpO2 98%   BMI 16.29 kg/m   Physical Exam Constitutional:      General: He is not in acute distress.    Appearance: He is not ill-appearing, toxic-appearing or diaphoretic.  HENT:     Head: Normocephalic and atraumatic.     Nose: Nose normal.     Mouth/Throat:     Mouth: Mucous membranes are moist.  Cardiovascular:     Rate and Rhythm: Tachycardia present.     Pulses: Normal pulses.  Musculoskeletal:     Cervical back: Normal range of motion.  Skin:    General: Skin is warm.     Capillary Refill: Capillary refill takes less than 2 seconds.  Neurological:     General: No focal deficit present.     Mental Status: He is alert and oriented to person, place, and time.  Psychiatric:        Mood and Affect: Mood normal.        Behavior: Behavior normal.        Thought Content: Thought content normal.        Judgment: Judgment normal.    Assessment & Plan:   Problem List Items Addressed This Visit       Digestive   Malignant neoplasm of middle third of esophagus ([place]) Continue with ongoing treatment pain with oncology    Other Visit Diagnoses     Tachycardia    -  Primary Persistent  Continue with metoprolol no change in dose due to potential hypotension  Thyroid evaluation     Relevant Orders   TSH (Completed)   T3 (Completed)   T4, free (Completed)   Screening for diabetes mellitus       Relevant Orders   HgB A1c (Completed)   Comp. Metabolic Panel (12) (Completed)       Outpatient Encounter Medications as of 08/25/2020  Medication Sig   acetaminophen (TYLENOL) 500 MG tablet Take 500 mg by mouth every 6 (six) hours as needed for mild pain or headache.   oxyCODONE (OXY IR/ROXICODONE) 5 MG immediate release tablet Take 1 tablet (5 mg total) by mouth every 6  (six) hours as needed for moderate pain or breakthrough pain.   dexamethasone (DECADRON) 4 MG tablet Take 1 tablet (4 mg total) by mouth 2 (two) times daily. (Patient not taking: Reported on 08/25/2020)   food thickener (SIMPLYTHICK, NECTAR/LEVEL 2/MILDLY THICK,) GEL Take 1 packet by mouth as needed. (Patient not taking: Reported on 08/25/2020)   metoprolol tartrate (LOPRESSOR) 25 MG tablet TAKE 1 TABLET(25 MG) BY  MOUTH TWICE DAILY (Patient not taking: Reported on 08/25/2020)   pantoprazole (PROTONIX) 40 MG tablet Take 1 tablet (40 mg total) by mouth daily. (Patient not taking: Reported on 08/25/2020)   [DISCONTINUED] albuterol (PROVENTIL HFA;VENTOLIN HFA) 108 (90 BASE) MCG/ACT inhaler Inhale 2 puffs into the lungs every 6 (six) hours as needed for wheezing or shortness of breath.   No facility-administered encounter medications on file as of 08/25/2020.    Follow-up: Return in about 4 weeks (around 09/22/2020).   Vevelyn Francois, NP

## 2020-08-25 NOTE — Patient Instructions (Addendum)
Please monitor BP and HR and call the office if heart rate remains >130  Ventricular Tachycardia  Ventricular tachycardia is a type of arrhythmia that begins in the lower chambers of the heart (ventricles). An arrhythmia is a type of abnormal heart rhythm. A normal heartbeat usually starts when an area in the heart called the sinoatrial (SA) node releases an electrical signal. With ventricular tachycardia, electrical signals in the ventricles fire abnormally and interfere with the electrical signals releasedby the SA node. A normal resting heart rate is 60-100 beats per minute. During an episode of ventricular tachycardia, the heart reaches 100 beats per minute or higher. Thiscondition can be life-threatening and should be treated right away. What are the causes? This condition is caused by abnormal electrical activity in your ventricles. This may result from: Your heart not getting enough oxygen. Blood flow problems in your arteries may cause this. Cardiomyopathy, or diseases of your heart muscle. Medicines. Sarcoidosis, or a disease that causes inflammation and affects multiple areas of your body. Drug use, such as use of cocaine, methamphetamines, or anabolic steroids. What increases the risk? The following factors may make you more likely to develop this condition: A previous heart attack. Diseases or disorders of your heart, such as: Structural abnormalities of the heart. These may be present at birth (congenital) or may develop over time. Narrowed or blocked arteries that lead to the heart (coronary artery disease). Abnormal heart tissue. Leaking or narrow valves in your heart. A family history of stopped heartbeat (cardiac arrest). A family history of coronary artery disease. Certain health conditions that can cause heart problems, such as: Diabetes. An infection that affects your heart. High blood pressure (hypertension). An overactive or underactive thyroid. Sleep apnea. This is  paused breathing or shallow breathing during sleep. High cholesterol. Using products that contain tobacco. Heavy alcohol use. What are the signs or symptoms? Symptoms of this condition include: Fast or irregular heartbeats (palpitations). Shortness of breath. Anxiety. Dizziness or light-headedness. Fainting. Chest pain. Cardiac arrest caused by an arrhythmia. How is this diagnosed? This condition may be diagnosed based on: Electrocardiogram (ECG), which checks for problems with electrical activity in your heart. A physical exam. Your symptoms and medical history. Holter monitor or event monitor test, which involves wearing a portable device that monitors your heart rate over time. You may also have other tests, including: Blood tests. Chest X-ray. Echocardiogram. This test uses sound waves to create images of your heart. Angiogram. During this test, dye is injected into your bloodstream, and then X-rays are taken. The dye helps to show blood flow in your arteries. Exercise stress test. During this test, you will have an ECG while you exercise on a treadmill. Cardiac CT scan or cardiac MRI. How is this treated? This condition is a medical emergency that must be treated right away. If thecondition is not treated right away, it becomes life-threatening. Treatment for this condition depends on the cause. Treatment may include: An electric shock, or cardioversion, to return your heartbeat to a normal rhythm. Medicines that slow your heart rate and return it to a normal rhythm (anti-arrhythmics). An electrophysiology study. This can help locate areas of heart tissue that are causing fast heartbeats. In this procedure, a long, thin tube (catheter) is inserted into a vein and moved to your heart to evaluate your heart's electrical activity. In some cases, the heart tissue that is causing problems may be burned out with a low-level energy source delivered through the catheter. This may help  your heart keep a normal rhythm. An implantable cardioverter-defibrillator (ICD). This device is inserted under the skin in your chest to monitor your heartbeat. When the ICD senses an irregular heartbeat, it sends a shock to return the heartbeat to normal. A wearable cardioverter-defibrillator (WCD) to use while treatment options are being evaluated. Genetic counseling to check whether your family members are at risk for ventricular tachycardia. Surgery to improve blood flow to the heart. Follow these instructions at home: Eating and drinking  Eat a healthy diet. This includes plenty of fruits and vegetables, whole grains, lean meats, and low-fat or fat-free dairy products. Avoid eating foods that are high in saturated fat, trans fat, sugar, or salt.  Lifestyle  Do not drink alcohol if: Your health care provider tells you not to drink. You are pregnant, may be pregnant, or are planning to become pregnant. If you drink alcohol: Limit how much you use to: 0-1 drink a day for women. 0-2 drinks a day for men. Be aware of how much alcohol is in your drink. In the [place] one drink equals one 12 oz bottle of beer (355 mL), one 5 oz glass of wine (148 mL), or one 1 oz glass of hard liquor (44 mL). Do not use any products that contain nicotine or tobacco, such as cigarettes, e-cigarettes, and chewing tobacco. If you need help quitting, ask your health care provider. Do not use drugs that excite, or stimulate, your system, such as cocaine or methamphetamines. Maintain a healthy weight. Manage stress through relaxation exercises, yoga, quiet time, or meditation. Try to get at least 7 hours of sleep each night.  General instructions Take over-the-counter and prescription medicines only as told by your health care provider. Exercise regularly. Ask what activities are safe for you. Aim for one or a combination of the following each week: 150 minutes of moderate-intensity exercise. 75 minutes of  exercise that takes a lot of effort. Keep all follow-up visits as told by your health care provider. This is important. Where to find more information American Heart Association: www.heart.org Contact a health care provider if: Your symptoms get worse. You develop new symptoms, such as new palpitations or shortness of breath. You feel depressed. Get help right away if you have: An episode of ventricular tachycardia that lasts more than a few seconds. Chest pain. Trouble breathing. Light-headedness or fainting. These symptoms may represent a serious problem that is an emergency. Do not wait to see if the symptoms will go away. Get medical help right away. Call your local emergency services (911 in the [place] Do not drive yourself to the hospital. Summary Ventricular tachycardia is a fast heartbeat that begins in the lower chambers of the heart (ventricles). This condition can be life-threatening and should be treated right away. Treatment may include electric shock, medicines, low-level energy therapy, a cardioverter-defibrillator, or surgery. Get help right away if you have ventricular tachycardia symptoms lasting longer than a few seconds, chest pain, or trouble breathing. This information is not intended to replace advice given to you by your health care provider. Make sure you discuss any questions you have with your healthcare provider. Document Revised: 11/12/2018 Document Reviewed: 11/12/2018 Elsevier Patient Education  2022 [employer].

## 2020-08-26 ENCOUNTER — Other Ambulatory Visit (HOSPITAL_COMMUNITY): Payer: Self-pay | Admitting: Diagnostic Radiology

## 2020-08-26 ENCOUNTER — Encounter: Payer: Self-pay | Admitting: Hematology and Oncology

## 2020-08-26 ENCOUNTER — Ambulatory Visit (HOSPITAL_COMMUNITY)
Admission: RE | Admit: 2020-08-26 | Discharge: 2020-08-26 | Disposition: A | Discharge status: Home / Self Care | Payer: Medicaid Other | Source: Ambulatory Visit | Attending: Hematology and Oncology | Admitting: Hematology and Oncology

## 2020-08-26 ENCOUNTER — Encounter (HOSPITAL_COMMUNITY): Payer: Self-pay

## 2020-08-26 ENCOUNTER — Ambulatory Visit: Payer: Medicaid Other

## 2020-08-26 DIAGNOSIS — C159 Malignant neoplasm of esophagus, unspecified: Secondary | ICD-10-CM | POA: Diagnosis not present

## 2020-08-26 DIAGNOSIS — R1319 Other dysphagia: Secondary | ICD-10-CM

## 2020-08-26 DIAGNOSIS — C154 Malignant neoplasm of middle third of esophagus: Secondary | ICD-10-CM

## 2020-08-26 DIAGNOSIS — D099 Carcinoma in situ, unspecified: Secondary | ICD-10-CM

## 2020-08-26 HISTORY — PX: IR GASTROSTOMY TUBE MOD SED: IMG625

## 2020-08-26 LAB — COMP. METABOLIC PANEL (12)
AST: 10 IU/L (ref 0–40)
Albumin/Globulin Ratio: 1.4 (ref 1.2–2.2)
Albumin: 4 g/dL (ref 3.8–4.8)
Alkaline Phosphatase: 63 IU/L (ref 44–121)
BUN/Creatinine Ratio: 20 (ref 10–24)
BUN: 28 mg/dL — ABNORMAL HIGH (ref 8–27)
Bilirubin Total: 0.9 mg/dL (ref 0.0–1.2)
Calcium: 9.9 mg/dL (ref 8.6–10.2)
Chloride: 100 mmol/L (ref 96–106)
Creatinine, Ser: 1.41 mg/dL — ABNORMAL HIGH (ref 0.76–1.27)
Globulin, Total: 2.9 g/dL (ref 1.5–4.5)
Glucose: 115 mg/dL — ABNORMAL HIGH (ref 65–99)
Potassium: 4.7 mmol/L (ref 3.5–5.2)
Sodium: 138 mmol/L (ref 134–144)
Total Protein: 6.9 g/dL (ref 6.0–8.5)
eGFR: 56 mL/min/{1.73_m2} — ABNORMAL LOW (ref >59)

## 2020-08-26 LAB — CBC
HCT: 36.4 % — ABNORMAL LOW (ref 39.0–52.0)
Hemoglobin: 12.1 g/dL — ABNORMAL LOW (ref 13.0–17.0)
MCH: 26.2 pg (ref 26.0–34.0)
MCHC: 33.2 g/dL (ref 30.0–36.0)
MCV: 79 fL — ABNORMAL LOW (ref 80.0–100.0)
Platelets: 150 10*3/uL (ref 150–400)
RBC: 4.61 MIL/uL (ref 4.22–5.81)
RDW: 14.3 % (ref 11.5–15.5)
WBC: 3.9 10*3/uL — ABNORMAL LOW (ref 4.0–10.5)
nRBC: 0 % (ref 0.0–0.2)

## 2020-08-26 LAB — T4, FREE: Free T4: 1.66 ng/dL (ref 0.82–1.77)

## 2020-08-26 LAB — T3: T3, Total: 90 ng/dL (ref 71–180)

## 2020-08-26 LAB — TSH: TSH: 2.37 u[IU]/mL (ref 0.450–4.500)

## 2020-08-26 LAB — PROTIME-INR
INR: 1.1 (ref 0.8–1.2)
Prothrombin Time: 14.3 seconds (ref 11.4–15.2)

## 2020-08-26 MED ORDER — CEFAZOLIN SODIUM-DEXTROSE 2-4 GM/100ML-% IV SOLN
INTRAVENOUS | Status: AC
  Administered 2020-08-26: 2 g via INTRAVENOUS
  Filled 2020-08-26: qty 100

## 2020-08-26 MED ORDER — FENTANYL CITRATE (PF) 100 MCG/2ML IJ SOLN
INTRAMUSCULAR | Status: AC
  Filled 2020-08-26: qty 2

## 2020-08-26 MED ORDER — SODIUM CHLORIDE 0.9 % IV SOLN: INTRAVENOUS | Status: DC

## 2020-08-26 MED ORDER — IOHEXOL 300 MG/ML  SOLN
50.0000 mL | Freq: Once | INTRAMUSCULAR | Status: AC | PRN
  Administered 2020-08-26: 20 mL

## 2020-08-26 MED ORDER — CEFAZOLIN SODIUM-DEXTROSE 2-4 GM/100ML-% IV SOLN: 2.0000 g | Freq: Once | INTRAVENOUS | Status: DC

## 2020-08-26 MED ORDER — FENTANYL CITRATE (PF) 100 MCG/2ML IJ SOLN
INTRAMUSCULAR | Status: AC | PRN
  Administered 2020-08-26 (×2): 50 ug via INTRAVENOUS

## 2020-08-26 MED ORDER — MIDAZOLAM HCL 2 MG/2ML IJ SOLN
INTRAMUSCULAR | Status: AC
  Filled 2020-08-26: qty 4

## 2020-08-26 MED ORDER — LIDOCAINE-EPINEPHRINE 1 %-1:100000 IJ SOLN
INTRAMUSCULAR | Status: AC | PRN
  Administered 2020-08-26: 10 mL

## 2020-08-26 MED ORDER — GLUCAGON HCL RDNA (DIAGNOSTIC) 1 MG IJ SOLR
INTRAMUSCULAR | Status: AC
  Filled 2020-08-26: qty 1

## 2020-08-26 MED ORDER — LIDOCAINE HCL 1 % IJ SOLN
INTRAMUSCULAR | Status: AC
  Administered 2020-08-26: 10 mL via SUBCUTANEOUS
  Filled 2020-08-26: qty 20

## 2020-08-26 MED ORDER — HYDROCODONE-ACETAMINOPHEN 5-325 MG PO TABS: 1.0000 | ORAL_TABLET | ORAL | Status: DC | PRN

## 2020-08-26 MED ORDER — CEFAZOLIN SODIUM-DEXTROSE 2-4 GM/100ML-% IV SOLN: 2.0000 g | INTRAVENOUS | Status: AC

## 2020-08-26 MED ORDER — LIDOCAINE VISCOUS HCL 2 % MT SOLN
OROMUCOSAL | Status: AC
  Administered 2020-08-26: 15 mL via SUBCUTANEOUS
  Filled 2020-08-26: qty 15

## 2020-08-26 MED ORDER — MIDAZOLAM HCL 2 MG/2ML IJ SOLN
INTRAMUSCULAR | Status: AC | PRN
  Administered 2020-08-26 (×2): 1 mg via INTRAVENOUS

## 2020-08-26 MED ORDER — HEPARIN SOD (PORK) LOCK FLUSH 100 UNIT/ML IV SOLN
INTRAVENOUS | Status: AC
  Filled 2020-08-26: qty 5

## 2020-08-26 MED ORDER — HEPARIN SOD (PORK) LOCK FLUSH 100 UNIT/ML IV SOLN
500.0000 [IU] | INTRAVENOUS | Status: AC | PRN
  Administered 2020-08-26: 500 [IU]

## 2020-08-26 MED ORDER — GLUCAGON HCL (RDNA) 1 MG IJ SOLR
INTRAMUSCULAR | Status: AC | PRN
  Administered 2020-08-26: .5 mL via INTRAVENOUS

## 2020-08-26 NOTE — Discharge Instructions (Addendum)
For problems or questions regarding the gastrostomy catheter, contact PA or Interventional Radiologist on call at [phone].  Please only take clear liquids by mouth until tomorrow morning then follow feeding instructions previously provided by Ernestene Kiel, RD.  You may use feeding tube tomorrow.  Clear Liquid Diet, Adult A clear liquid diet is a diet that includes only liquids that you can see through. No solid food is eaten on this diet. Most people need to follow this diet for only a short time.  What are tips for following this plan? A clear liquid is a liquid that you can see through when you hold it up to a light.  This diet does not give you all the nutrients that you need. Choose a variety of the liquids that your doctor says you can have on this diet. That way, you will get as many nutrients as possible. If you are not sure whether you can have certain items, ask your doctor. If you cannot swallow a thin liquid, you will need to thicken it before taking it. This will stop you from breathing it in (aspirating). What foods should I eat?  Water and flavored water. Fruit juices that do not have pulp. This includes cranberry juice, apple juice, or grape juice. Tea and coffee without milk or cream. Clear bouillon or broth. Broth-based soups that have been strained. Honey. Sugar water. Ice or frozen ice pops that do not have any milk, yogurt, fruit pieces, or fruit pulp in them. Clear sodas. Clear sports drinks. The items listed above may not be a complete list of what you can eat and drink. Contact a dietitian for more options. What foods should I avoid? Juices that have pulp. Milk. Cream or cream-based soups. Yogurt. Solid foods that are not clear liquids or semi-liquids. The items listed above may not be a complete list of what you should not eat and drink. Contact a dietitian for more information. Questions to ask your doctor: How long do I need to follow this diet? Are  there any medicines that I should change while on this diet? Summary A clear liquid diet is a diet that includes only liquids and semi-liquids that you can see through. Some goals of this diet are to rest your stomach and make sure you get enough fluid. Avoid liquids with milk, cream, or pulp while you are on this diet. This information is not intended to replace advice given to you by your health care provider. Make sure you discuss any questions you have with your healthcare provider. Document Revised: 10/27/2019 Document Reviewed: 10/27/2019 Elsevier Patient Education  2022 [place].    PEG Tube Home Guide A PEG tube is used to put food, medicine, and fluids into the stomach. Before you leave the hospital, make sure that you know: How to care for your PEG tube. How to care for the opening (stoma) in your belly. How to give yourself a feeding. How to give yourself medicines. When to call your doctor for help. Supplies needed: Soapy water. Clean, plain water. Clean washcloth. Bandage (dressing). This is optional. Syringe. How to care for a PEG tube Check your PEG tube every day. Make sure: It is not too tight. It is in the correct place. There is a mark on the tube that shows when the tube is in the correct place. Adjust the tube if you need to. Cleaning your stoma  Clean your stoma every day. Follow these steps: Wash your hands with soap and water  for at least 20 seconds. If you cannot use soap and water, use hand sanitizer. Check your stoma. Let your doctor know if there is: Redness. Swelling. Leaking. Skin irritation. Wash the stoma gently with warm, soapy water. Rinse the stoma with plain water. Pat the stoma area dry. Place a bandage over the stoma if your doctor told you to do that.   Giving yourself a feeding  Your doctor will tell you:  Please continue to follow feeding instructions provided by Ernestene Kiel, RD and Ihor Gully, RN. How much nutrition and  fluid you will need for each feeding. How often to have a feeding. Whether you should take medicine in the tube by itself or with a feeding. To give yourself a feeding, follow these steps: Hoyle Barr out all of the things that you will need. Make sure that the nutritional formula is at room temperature. Wash your hands with soap and water for at least 20 seconds. If you cannot use soap and water, use hand sanitizer. Sit up or stand up straight. You will need to stay sitting up or standing up while you give yourself a feeding. Make sure the syringe plunger is pushed in. Place the tip of the syringe in clean water, and slowly pull the plunger to bring (draw up) the water into the syringe. Remove the clamp and the cap from the PEG tube. Push the water out of the syringe to clean (flush) the tube. If the tube is clear, draw up the formula into the syringe. Make sure to use the right amount for each feeding. Add water if you need to. Slowly push the formula from the syringe through the tube. After the feeding, flush the tube with water. Put the clamp and the cap on the tube. Stay sitting up or standing up straight for at least 30 minutes. Use the feeding tube equipment, such as syringes and connectors, as told byyour doctor. Giving yourself medicine  To give yourself medicine, follow these steps: Lay out all of the things that you will need. If your medicine is in tablet form, crush it and dissolve it in water. Wash your hands with soap and water for at least 20 seconds. If you cannot use soap and water, use hand sanitizer. Sit up or stand up straight. You will need to stay sitting up or standing up while you give yourself medicine. Make sure the syringe plunger is pushed in. Place the tip of the syringe in clean water, and slowly pull the plunger to bring (draw up) the water into the syringe. Remove the clamp and the cap from the PEG tube. Push the water out of the syringe to clean (flush) the  tube. If the tube is clear, draw up the medicine into the syringe. Slowly push the medicine from the syringe through the tube. Flush the tube with water. Put the clamp and the cap on the tube. Stay sitting up or standing up straight for at least 30 minutes. Do not take sustained release (SR) medicines through your tube. If you are not sureif your medicine is an SR medicine, ask your doctor. Contact a doctor if: The area around your stoma is sore, irritated, or red. You have belly pain or bloating while you are feeding or after you feed. You feel like you may vomit (nauseous) and the feeling will not go away. You have trouble pooping (constipation) or you have watery poop (diarrhea) for a long time. You have a fever. You have problems with your PEG  tube. Get help right away if: Your tube is blocked. Your tube falls out. You have pain around your stoma. You are bleeding from your stoma. Your tube is leaking. You choke or you have trouble breathing while you are feeding or after you feed. Summary A PEG tube is used to put food and fluids into the stomach. Before you leave the hospital, you will be taught how to use and care for your PEG tube. Your doctor will give you instructions on how to give yourself feedings and medicines. Contact your doctor if you have a fever or soreness, redness, or irritation around your stoma. Get help right away if your tube leaks, is blocked, or falls out. Also, get help right away if you have pain or bleeding around your stoma. This information is not intended to replace advice given to you by your health care provider. Make sure you discuss any questions you have with your healthcare provider. Document Revised: 12/30/2019 Document Reviewed: 05/22/2019 Elsevier Patient Education  [place].  Moderate Conscious Sedation, Adult, Care After This sheet gives you information about how to care for yourself after your procedure. Your health care provider  may also give you more specific instructions. If you have problems or questions, contact your health careprovider. What can I expect after the procedure? After the procedure, it is common to have: Sleepiness for several hours. Impaired judgment for several hours. Difficulty with balance. Vomiting if you eat too soon. Follow these instructions at home: For the time period you were told by your health care provider:     Rest. Do not participate in activities where you could fall or become injured. Do not drive or use machinery. Do not drink alcohol. Do not take sleeping pills or medicines that cause drowsiness. Do not make important decisions or sign legal documents. Do not take care of children on your own. Eating and drinking  Follow the diet recommended by your health care provider. Drink enough fluid to keep your urine pale yellow. If you vomit: Drink water, juice, or soup when you can drink without vomiting. Make sure you have little or no nausea before eating solid foods.   General instructions Take over-the-counter and prescription medicines only as told by your health care provider. Have a responsible adult stay with you for the time you are told. It is important to have someone help care for you until you are awake and alert. Do not smoke. Keep all follow-up visits as told by your health care provider. This is important. Contact a health care provider if: You are still sleepy or having trouble with balance after 24 hours. You feel light-headed. You keep feeling nauseous or you keep vomiting. You develop a rash. You have a fever. You have redness or swelling around the IV site. Get help right away if: You have trouble breathing. You have new-onset confusion at home. Summary After the procedure, it is common to feel sleepy, have impaired judgment, or feel nauseous if you eat too soon. Rest after you get home. Know the things you should not do after the  procedure. Follow the diet recommended by your health care provider and drink enough fluid to keep your urine pale yellow. Get help right away if you have trouble breathing or new-onset confusion at home. This information is not intended to replace advice given to you by your health care provider. Make sure you discuss any questions you have with your healthcare provider. Document Revised: 05/08/2019 Document Reviewed: 12/04/2018 Elsevier  Patient Education  2022 [employer].

## 2020-08-26 NOTE — Procedures (Signed)
Interventional Radiology Procedure:   Indications: Metastatic esophageal cancer  Procedure: Gastrostomy tube placement (push thru with T-fasteners)  Findings: 3 T-fasteners placed.  20 Fr balloon retention tube placed.  Complications: No immediate complications noted.     EBL: Minimal  Plan: Discharge to home today.  Clear liquids today.  May use tube for feeding tomorrow and resume previous diet tomorrow.  Will schedule for T-fastener removal in 10-14 days.   Kimble Delaurentis R. Anselm Pancoast, MD  Pager: [phone]

## 2020-08-26 NOTE — H&P (Signed)
Referring Physician(s): Dorsey,John T IV  Supervising Physician: Markus Daft  Patient Status:  [facility]  Chief Complaint:  "I'm here for a stomach tube"  Subjective: Patient familiar to IR service from Port-A-Cath placement on 08/15/2020.  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  He currently denies fever, headache, worsening dyspnea, cough, nausea, vomiting or bleeding.  He has had weight loss, dysphagia, lower anterior chest/epigastric discomfort and back pain.  Past Medical History:  Diagnosis Date   Cat bite of left hand 05/11/2015   COPD (chronic obstructive pulmonary disease) (HCC)    Malignant neoplasm of middle third of esophagus ([place]) 07/31/2020   Smoker    Past Surgical History:  Procedure Laterality Date   APPENDECTOMY     BIOPSY  07/18/2020   Procedure: BIOPSY;  Surgeon: Mauri Pole, MD;  Location: [facility];  Service: Endoscopy;;   ESOPHAGOGASTRODUODENOSCOPY (EGD) WITH PROPOFOL N/A 07/18/2020   Procedure: ESOPHAGOGASTRODUODENOSCOPY (EGD) WITH PROPOFOL;  Surgeon: Mauri Pole, MD;  Location: [facility];  Service: Endoscopy;  Laterality: N/A;   I & D EXTREMITY Left 05/13/2015   Procedure: INCISION AND DRAINAGE OF LEFT EXTREMITY;  Surgeon: Charlotte Crumb, MD;  Location: [facility];  Service: Orthopedics;  Laterality: Left;   INCISION AND DRAINAGE Left 05/13/2015   IR IMAGING GUIDED PORT INSERTION  08/15/2020      Allergies: Patient has no known allergies.  Medications: Prior to Admission medications   Medication Sig Start Date End Date Taking? Authorizing Provider  acetaminophen (TYLENOL) 500 MG tablet Take 500 mg by mouth every 6 (six) hours as needed for mild pain or headache.    [provider]  dexamethasone (DECADRON) 4 MG tablet Take 1 tablet (4 mg total) by mouth 2 (two) times daily. Patient not taking:  Reported on 08/25/2020 08/02/20   Hayden Pedro, PA-C  food thickener (SIMPLYTHICK, NECTAR/LEVEL 2/MILDLY THICK,) GEL Take 1 packet by mouth as needed. Patient not taking: Reported on 08/25/2020 07/20/20   Donne Hazel, MD  metoprolol tartrate (LOPRESSOR) 25 MG tablet TAKE 1 TABLET(25 MG) BY MOUTH TWICE DAILY Patient not taking: Reported on 08/25/2020 08/24/20   Orson Slick, MD  oxyCODONE (OXY IR/ROXICODONE) 5 MG immediate release tablet Take 1 tablet (5 mg total) by mouth every 6 (six) hours as needed for moderate pain or breakthrough pain. 08/19/20   Orson Slick, MD  pantoprazole (PROTONIX) 40 MG tablet Take 1 tablet (40 mg total) by mouth daily. Patient not taking: Reported on 08/25/2020 08/02/20   Hayden Pedro, PA-C  albuterol (PROVENTIL HFA;VENTOLIN HFA) 108 (90 BASE) MCG/ACT inhaler Inhale 2 puffs into the lungs every 6 (six) hours as needed for wheezing or shortness of breath. 10/21/14 05/12/15  Velvet Bathe, MD     Vital Signs: BP (!) 138/97   Pulse (!) 115   Temp 98.2 F (36.8 C) (Oral)   Resp 18   SpO2 96%   Physical Exam awake, alert.  Chest with slightly diminished breath sounds left base, right clear.  Clean, intact right chest wall Port-A-Cath.  Heart with tachycardic but regular rhythm.  Abdomen soft, positive bowel sounds, nontender.  No lower extremity edema.  Imaging: No results found.  Labs:  CBC: Recent Labs    07/16/20 1855 07/17/20 0437 07/18/20 0305 07/28/20 1039  WBC 8.6 9.0 8.5 8.1  HGB 11.7* 11.3* 12.6* 12.6*  HCT 35.0* 34.0* 38.2* 37.6*  PLT 163 163 165 362    COAGS: No results for input(s): INR, APTT in the last 8760 hours.  BMP: Recent Labs    07/16/20 1855 07/17/20 0437 07/18/20 0305 07/28/20 1039 08/25/20 1507  NA 136 137 137 137 138  K 3.8 4.0 4.6 5.0 4.7  CL 101 101 99 101 100  CO2 '27 24 27 26  '$ --   GLUCOSE 109* 99 111* 107* 115*  BUN '13 11 14 17 '$ 28*  CALCIUM 9.1 9.0 9.4 9.9 9.9  CREATININE 1.22 1.03 1.14  1.49* 1.41*  GFRNONAA >60 >60 >60 53*  --     LIVER FUNCTION TESTS: Recent Labs    07/16/20 1855 07/17/20 0437 07/28/20 1039 08/25/20 1507  BILITOT 1.1 1.0 0.7 0.9  AST <5* 10* 10* 10  ALT '9 8 9  '$ --   ALKPHOS 53 50 60 63  PROT 7.0 6.5 7.7 6.9  ALBUMIN 3.6 3.3* 3.5 4.0    Assessment and Plan: Patient familiar to IR service from Port-A-Cath placement on 08/15/2020.  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 Risks and benefits image guided gastrostomy tube placement was discussed with the patient including, but not limited to the need for a barium enema during the procedure, bleeding, infection, peritonitis and/or damage to adjacent structures.  All of the patient's questions were answered, patient is agreeable to proceed.  Consent signed and in chart.    Electronically Signed: D. Rowe Robert, PA-C 08/26/2020, 10:43 AM   I spent a total of  20 minutes at the the patient's bedside AND on the patient's hospital floor or unit, greater than 50% of which was counseling/coordinating care for percutaneous gastrostomy tube

## 2020-08-29 ENCOUNTER — Inpatient Hospital Stay: Payer: Medicaid Other

## 2020-08-29 ENCOUNTER — Other Ambulatory Visit: Payer: Self-pay | Admitting: *Deleted

## 2020-08-29 ENCOUNTER — Telehealth: Payer: Self-pay

## 2020-08-29 ENCOUNTER — Other Ambulatory Visit: Payer: Self-pay

## 2020-08-29 ENCOUNTER — Inpatient Hospital Stay (HOSPITAL_BASED_OUTPATIENT_CLINIC_OR_DEPARTMENT_OTHER): Payer: Medicaid Other | Admitting: Hematology and Oncology

## 2020-08-29 ENCOUNTER — Ambulatory Visit: Payer: Medicaid Other

## 2020-08-29 ENCOUNTER — Inpatient Hospital Stay: Payer: Medicaid Other | Admitting: Nutrition

## 2020-08-29 ENCOUNTER — Other Ambulatory Visit: Payer: Self-pay | Admitting: Hematology and Oncology

## 2020-08-29 VITALS — BP 108/82 | HR 117 | Temp 97.5°F | Resp 15 | Ht 67.0 in | Wt 102.7 lb

## 2020-08-29 DIAGNOSIS — R1310 Dysphagia, unspecified: Secondary | ICD-10-CM | POA: Diagnosis not present

## 2020-08-29 DIAGNOSIS — C787 Secondary malignant neoplasm of liver and intrahepatic bile duct: Secondary | ICD-10-CM | POA: Insufficient documentation

## 2020-08-29 DIAGNOSIS — G893 Neoplasm related pain (acute) (chronic): Secondary | ICD-10-CM | POA: Diagnosis not present

## 2020-08-29 DIAGNOSIS — C154 Malignant neoplasm of middle third of esophagus: Secondary | ICD-10-CM

## 2020-08-29 DIAGNOSIS — C159 Malignant neoplasm of esophagus, unspecified: Secondary | ICD-10-CM

## 2020-08-29 DIAGNOSIS — Z931 Gastrostomy status: Secondary | ICD-10-CM | POA: Insufficient documentation

## 2020-08-29 DIAGNOSIS — Z5111 Encounter for antineoplastic chemotherapy: Secondary | ICD-10-CM | POA: Insufficient documentation

## 2020-08-29 DIAGNOSIS — F17210 Nicotine dependence, cigarettes, uncomplicated: Secondary | ICD-10-CM | POA: Insufficient documentation

## 2020-08-29 DIAGNOSIS — R1319 Other dysphagia: Secondary | ICD-10-CM

## 2020-08-29 DIAGNOSIS — J449 Chronic obstructive pulmonary disease, unspecified: Secondary | ICD-10-CM | POA: Insufficient documentation

## 2020-08-29 DIAGNOSIS — Z95828 Presence of other vascular implants and grafts: Secondary | ICD-10-CM

## 2020-08-29 DIAGNOSIS — Z923 Personal history of irradiation: Secondary | ICD-10-CM | POA: Diagnosis not present

## 2020-08-29 DIAGNOSIS — C7951 Secondary malignant neoplasm of bone: Secondary | ICD-10-CM | POA: Diagnosis not present

## 2020-08-29 LAB — CMP (CANCER CENTER ONLY)
ALT: 6 U/L (ref 0–44)
AST: 10 U/L — ABNORMAL LOW (ref 15–41)
Albumin: 3.4 g/dL — ABNORMAL LOW (ref 3.5–5.0)
Alkaline Phosphatase: 57 U/L (ref 38–126)
Anion gap: 12 (ref 5–15)
BUN: 26 mg/dL — ABNORMAL HIGH (ref 8–23)
CO2: 24 mmol/L (ref 22–32)
Calcium: 9.8 mg/dL (ref 8.9–10.3)
Chloride: 104 mmol/L (ref 98–111)
Creatinine: 1.09 mg/dL (ref 0.61–1.24)
GFR, Estimated: >60 mL/min (ref >60)
Glucose, Bld: 106 mg/dL — ABNORMAL HIGH (ref 70–99)
Potassium: 4.2 mmol/L (ref 3.5–5.1)
Sodium: 140 mmol/L (ref 135–145)
Total Bilirubin: 0.8 mg/dL (ref 0.3–1.2)
Total Protein: 7.1 g/dL (ref 6.5–8.1)

## 2020-08-29 LAB — CBC WITH DIFFERENTIAL (CANCER CENTER ONLY)
Abs Immature Granulocytes: 0.02 10*3/uL (ref 0.00–0.07)
Basophils Absolute: 0 10*3/uL (ref 0.0–0.1)
Basophils Relative: 1 %
Eosinophils Absolute: 0.2 10*3/uL (ref 0.0–0.5)
Eosinophils Relative: 3 %
HCT: 33.6 % — ABNORMAL LOW (ref 39.0–52.0)
Hemoglobin: 11.5 g/dL — ABNORMAL LOW (ref 13.0–17.0)
Immature Granulocytes: 1 %
Lymphocytes Relative: 9 %
Lymphs Abs: 0.4 10*3/uL — ABNORMAL LOW (ref 0.7–4.0)
MCH: 26.1 pg (ref 26.0–34.0)
MCHC: 34.2 g/dL (ref 30.0–36.0)
MCV: 76.2 fL — ABNORMAL LOW (ref 80.0–100.0)
Monocytes Absolute: 0.6 10*3/uL (ref 0.1–1.0)
Monocytes Relative: 13 %
Neutro Abs: 3.2 10*3/uL (ref 1.7–7.7)
Neutrophils Relative %: 73 %
Platelet Count: 172 10*3/uL (ref 150–400)
RBC: 4.41 MIL/uL (ref 4.22–5.81)
RDW: 14.5 % (ref 11.5–15.5)
WBC Count: 4.4 10*3/uL (ref 4.0–10.5)
nRBC: 0 % (ref 0.0–0.2)

## 2020-08-29 MED ORDER — LIDOCAINE-PRILOCAINE 2.5-2.5 % EX CREA: 1.0000 "application " | TOPICAL_CREAM | CUTANEOUS | 0 refills | Status: DC | PRN

## 2020-08-29 MED ORDER — PROCHLORPERAZINE MALEATE 10 MG PO TABS: 10.0000 mg | ORAL_TABLET | Freq: Four times a day (QID) | ORAL | 0 refills | Status: DC | PRN

## 2020-08-29 MED ORDER — SODIUM CHLORIDE 0.9% FLUSH
10.0000 mL | INTRAVENOUS | Status: DC | PRN
  Administered 2020-08-29: 10 mL via INTRAVENOUS
  Filled 2020-08-29: qty 10

## 2020-08-29 MED ORDER — HEPARIN SOD (PORK) LOCK FLUSH 100 UNIT/ML IV SOLN
500.0000 [IU] | Freq: Once | INTRAVENOUS | Status: AC
  Administered 2020-08-29: 500 [IU] via INTRAVENOUS
  Filled 2020-08-29: qty 5

## 2020-08-29 MED ORDER — ONDANSETRON HCL 8 MG PO TABS: 8.0000 mg | ORAL_TABLET | Freq: Three times a day (TID) | ORAL | 0 refills | Status: DC | PRN

## 2020-08-29 MED ORDER — OSMOLITE 1.5 CAL PO LIQD: ORAL | 6 refills | Status: DC

## 2020-08-29 NOTE — Progress Notes (Signed)
Nutrition follow-up completed with patient receiving treatment for metastatic esophageal cancer.  Patient weighed 102.7 pounds on August 8 decreased from 117.6 pounds July 7.  This is 13% weight loss over 1 month which is significant. Patient has severe loss of both muscle and fat on physical exam.  Patient had his feeding tube placed on Friday last week.  He did not flush or clean the tube site over the weekend.  States he is eating "a little ".  He is sipping on water throughout the day but does not know how much.  Estimated nutrition needs:  1600-1865 cal, 85-96 g protein, 1.7 L fluid.  Nutrition diagnosis:  Inadequate oral intake continues.   Severe malnutrition continues.  Intervention: Patient demonstrated using 4 ounces of Osmolite 1.5 via G-tube with 60 mL free water before and after bolus feeding. Patient educated on slow tube feeding advancement and he was given a schedule for increasing tube feeding slowly over this week.  Patient will increase Osmolite 1.5 x 1/2 can daily to goal rate of 5 cans daily. Nurse navigator contacted to help patient clean around his feeding tube and give further instructions and encouragement. Recommended patient continue to try to try to consume small amounts of food throughout the day.  Encouraged him to try to sip on minimum of 24 ounces of water daily. Recommended labs 2 times a week to monitor for refeeding syndrome.  RN and MD aware.  MD to replete as needed.  5 cartons Osmolite 1.5 with 60 mL free water flushes before and after feedings 4 times a day plus an additional 240 mL free water 3 times a day provides approximately 1775 cal, 74.5 g protein, 2105 mL free water.  Tube feeding orders written.  [employer] notified.  Monitoring, evaluation, goals: Patient will tolerate adequate calories and protein to minimize further weight loss.  He will tolerate tube feeding advancement without experiencing refeeding syndrome.  Next visit: To be  scheduled weekly with treatment.  **Disclaimer: This note was dictated with voice recognition software. Similar sounding words can inadvertently be transcribed and this note may contain transcription errors which may not have been corrected upon publication of note.**

## 2020-08-29 NOTE — Progress Notes (Signed)
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 CT angio chest showed diffuse esophageal wall thickening and haziness as well as surrounding posterior mediastinal haziness/fat stranding. Findings concerning for malignancy 07/17/2020: MRI abdomen showed signs of malignancy with mass in the region of the hepatogastric recess near the pancreatic head that narrows vascular structures and is associated with hepatic metastatic disease 07/18/2020: EGD showed a large ulcerating mass with friable mucosa and bleeding on contact in the middle 1/3 of the esophagus. Biopsy was obtained, results consistent with squamous cell carcinoma. 07/28/2020: establish care with Dr. Lorenso Courier  08/30/2020: intended start of Cycle 1 Day 1 of FOLFOX chemotherapy  Interval History:  Christian Newton 60 y.o. 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 The patient's last visit was on 07/28/2020. In the interim since the last visit he has completed palliative radiation to the esophagus and spine.   On exam today Christian Newton notes that he tolerated the radiation therapy well.  He is unfortunately not having much improvement in swallowing yet and his back pain is currently a 7 out of 10 in severity.  He is lost about 4 pounds since the beginning of the month.  He notes he is drinking okay but still has trouble with solid foods.  He notes that he is having difficulty with mobility and is using a walker to get around.  He has not been having any difficulties with fevers, chills, sweats, nausea, vomiting or  diarrhea.  His port is well situated and he is not having trouble with that.  The bulk of our discussion today focused on the treatment of FOLFOX chemotherapy and assuring he had everything he needed to get started with treatment.  MEDICAL HISTORY:  Past Medical History:  Diagnosis Date   Cat bite of left hand 05/11/2015   COPD (chronic obstructive pulmonary disease) (HCC)    Malignant neoplasm of middle third of esophagus ([place]) 07/31/2020   Smoker     SURGICAL HISTORY: Past Surgical History:  Procedure Laterality Date   APPENDECTOMY     BIOPSY  07/18/2020   Procedure: BIOPSY;  Surgeon: Mauri Pole, MD;  Location: [facility];  Service: Endoscopy;;   ESOPHAGOGASTRODUODENOSCOPY (EGD) WITH PROPOFOL N/A 07/18/2020   Procedure: ESOPHAGOGASTRODUODENOSCOPY (EGD) WITH PROPOFOL;  Surgeon: Mauri Pole, MD;  Location: [facility];  Service: Endoscopy;  Laterality: N/A;   I & D EXTREMITY Left 05/13/2015   Procedure: INCISION AND DRAINAGE OF LEFT EXTREMITY;  Surgeon: Charlotte Crumb, MD;  Location: [facility];  Service: Orthopedics;  Laterality: Left;   INCISION AND DRAINAGE Left 05/13/2015   IR GASTROSTOMY TUBE MOD SED  08/26/2020   IR IMAGING GUIDED PORT INSERTION  08/15/2020    SOCIAL HISTORY: Social History   Socioeconomic History   Marital status: Single    Spouse name: Not on file   Number of children: Not on file   Years of education: Not on file   Highest education level: Not on file  Occupational History   Not on file  Tobacco Use   Smoking status: Every Day    Packs/day: 0.25    Years:  20.00    Pack years: 5.00    Types: Cigarettes   Smokeless tobacco: Never  Vaping Use   Vaping Use: Never used  Substance and Sexual Activity   Alcohol use: No   Drug use: No   Sexual activity: Not on file  Other Topics Concern   Not on file  Social History Narrative   Not on file   Social Determinants of Health   Financial Resource Strain: High Risk   Difficulty of  Paying Living Expenses: Very hard  Food Insecurity: Food Insecurity Present   Worried About Charity fundraiser in the Last Year: Sometimes true   Ran Out of Food in the Last Year: Sometimes true  Transportation Needs: Unmet Transportation Needs   Lack of Transportation (Medical): Yes   Lack of Transportation (Non-Medical): Yes  Physical Activity: Not on file  Stress: Stress Concern Present   Feeling of Stress : To some extent  Social Connections: Unknown   Frequency of Communication with Friends and Family: Twice a week   Frequency of Social Gatherings with Friends and Family: Once a week   Attends Religious Services: Never   Printmaker: Not on file   Attends Archivist Meetings: Not on file   Marital Status: Not on file  Intimate Partner Violence: Not on file    FAMILY HISTORY: No family history on file.  ALLERGIES:  has No Known Allergies.  MEDICATIONS:  Current Outpatient Medications  Medication Sig Dispense Refill   dexamethasone (DECADRON) 4 MG tablet Take 1 tablet (4 mg total) by mouth 2 (two) times daily. 60 tablet 0   food thickener (SIMPLYTHICK, NECTAR/LEVEL 2/MILDLY THICK,) GEL Take 1 packet by mouth as needed. 30 packet 0   lidocaine-prilocaine (EMLA) cream Apply 1 application topically as needed. 30 g 0   ondansetron (ZOFRAN) 8 MG tablet Take 1 tablet (8 mg total) by mouth every 8 (eight) hours as needed. 30 tablet 0   prochlorperazine (COMPAZINE) 10 MG tablet Take 1 tablet (10 mg total) by mouth every 6 (six) hours as needed for nausea or vomiting. 30 tablet 0   acetaminophen (TYLENOL) 500 MG tablet Take 500 mg by mouth every 6 (six) hours as needed for mild pain or headache.     metoprolol tartrate (LOPRESSOR) 25 MG tablet TAKE 1 TABLET(25 MG) BY MOUTH TWICE DAILY (Patient not taking: Reported on 08/25/2020) 60 tablet 0   Nutritional Supplements (FEEDING SUPPLEMENT, OSMOLITE 1.5 CAL,) LIQD Begin one half carton Osmolite 1.5 via  G-tube 4 times daily with 60 mL free water before and after bolus feeding.  Increase by one half cartons daily to goal rate of 5 cartons a day split into 4 feedings.  Drink or flush tube with additional 240 mL free water 3 times daily.  Provides 1775 cal, 74.5 g protein and 2105 mL free water.(Greater than 100% minimum calorie needs and 88% minimum protein needs) 1185 mL 6   oxyCODONE (OXY IR/ROXICODONE) 5 MG immediate release tablet Take 1 tablet (5 mg total) by mouth every 6 (six) hours as needed for moderate pain or breakthrough pain. 60 tablet 0   pantoprazole (PROTONIX) 40 MG tablet Take 1 tablet (40 mg total) by mouth daily. (Patient not taking: Reported on 08/29/2020) 30 tablet 1   No current facility-administered medications for this visit.    REVIEW OF SYSTEMS:   Constitutional: ( - ) fevers, ( - )  chills , ( - ) night sweats Eyes: ( - )  blurriness of vision, ( - ) double vision, ( - ) watery eyes Ears, nose, mouth, throat, and face: ( - ) mucositis, ( - ) sore throat Respiratory: ( - ) cough, ( - ) dyspnea, ( - ) wheezes Cardiovascular: ( - ) palpitation, ( - ) chest discomfort, ( - ) lower extremity swelling Gastrointestinal:  ( - ) nausea, ( - ) heartburn, ( - ) change in bowel habits Skin: ( - ) abnormal skin rashes Lymphatics: ( - ) new lymphadenopathy, ( - ) easy bruising Neurological: ( - ) numbness, ( - ) tingling, ( - ) new weaknesses Behavioral/Psych: ( - ) mood change, ( - ) new changes  All other systems were reviewed with the patient and are negative.  PHYSICAL EXAMINATION: ECOG PERFORMANCE STATUS: 2 - Symptomatic, <50% confined to bed  Vitals:   08/29/20 1145  BP: 108/82  Pulse: (!) 117  Resp: 15  Temp: (!) 97.5 F (36.4 C)  SpO2: 99%   Filed Weights   08/29/20 1145  Weight: 102 lb 11.2 oz (46.6 kg)    GENERAL: Chronically ill-appearing middle-aged African-American male, alert, no distress and comfortable SKIN: skin color, texture, turgor are normal, no  rashes or significant lesions EYES: conjunctiva are pink and non-injected, sclera clear LUNGS: clear to auscultation and percussion with normal breathing effort HEART: regular rate & rhythm and no murmurs and no lower extremity edema ABDOMEN: PEG tube in place at left side of abdomen. PSYCH: alert & oriented x 3, fluent speech NEURO: no focal motor/sensory deficits  LABORATORY DATA:  I have reviewed the data as listed CBC Latest Ref Rng & Units 08/29/2020 08/26/2020 07/28/2020  WBC 4.0 - 10.5 K/uL 4.4 3.9(L) 8.1  Hemoglobin 13.0 - 17.0 g/dL 11.5(L) 12.1(L) 12.6(L)  Hematocrit 39.0 - 52.0 % 33.6(L) 36.4(L) 37.6(L)  Platelets 150 - 400 K/uL 172 150 362    CMP Latest Ref Rng & Units 08/29/2020 08/25/2020 07/28/2020  Glucose 70 - 99 mg/dL 106(H) 115(H) 107(H)  BUN 8 - 23 mg/dL 26(H) 28(H) 17  Creatinine 0.61 - 1.24 mg/dL 1.09 1.41(H) 1.49(H)  Sodium 135 - 145 mmol/L 140 138 137  Potassium 3.5 - 5.1 mmol/L 4.2 4.7 5.0  Chloride 98 - 111 mmol/L 104 100 101  CO2 22 - 32 mmol/L 24 - 26  Calcium 8.9 - 10.3 mg/dL 9.8 9.9 9.9  Total Protein 6.5 - 8.1 g/dL 7.1 6.9 7.7  Total Bilirubin 0.3 - 1.2 mg/dL 0.8 0.9 0.7  Alkaline Phos 38 - 126 U/L 57 63 60  AST 15 - 41 U/L 10(L) 10 10(L)  ALT 0 - 44 U/L 6 - 9   RADIOGRAPHIC STUDIES: IR Gastrostomy Tube  Result Date: 08/26/2020 INDICATION: 60 year old with malignant neoplasm of middle third of the esophagus. Patient has esophageal dysphagia and needs percutaneous gastrostomy tube for nutrition. EXAM: PERCUTANEOUS GASTROSTOMY TUBE WITH FLUOROSCOPIC GUIDANCE Physician: Stephan Minister. Anselm Pancoast, MD MEDICATIONS: Ancef 2 g; Antibiotics were administered within 1 hour of the procedure. Glucagon 0.5 mg IV ANESTHESIA/SEDATION: Versed 2.0 mg IV; Fentanyl 100 mcg IV Moderate Sedation Time:  33 minutes The patient was continuously monitored during the procedure by the interventional radiology nurse under my direct supervision. FLUOROSCOPY TIME:  Fluoroscopy Time: 4 minutes, 54 seconds,  18 mGy COMPLICATIONS: None immediate. PROCEDURE: The procedure was explained to the patient. The risks and benefits of the procedure were discussed and the patient's questions were addressed. Informed consent was obtained from the patient. The patient was placed on the interventional table.  Nasogastric tube was placed with fluoroscopy. The anterior abdomen was prepped and draped in sterile fashion. Maximal barrier sterile technique was utilized including caps, mask, sterile gowns, sterile gloves, sterile drape, hand hygiene and skin antiseptic. Stomach was inflated with air through the nasogastric tube. The skin and subcutaneous tissues were anesthetized with 1% lidocaine. Using fluoroscopic guidance, T fastener was deployed in the stomach. Total of 3 T-fasteners were placed in a triangular configuration. Small incision was made between the T-fasteners. Needle was directed into the stomach using fluoroscopic guidance and super stiff Amplatz wire was advanced in the stomach. The tract was dilated to accommodate a 22 French peel-away sheath. A 20 French Entuit gastrostomy tube was advanced over the wire. The balloon was inflated with approximately 10 mL of water. Contrast injection confirmed placement in the stomach. FINDINGS: Stomach was adequately insufflated for the procedure. Three T-fasteners were successfully placed in the stomach. 18 French gastrostomy tube was placed between the T-fasteners. Contrast injection confirmed placement in the stomach. IMPRESSION: 1. Successful placement of a fluoroscopic guided balloon retention gastrostomy tube. 2. Patient has 3 T-fasteners. Patient will be scheduled to return in 10-14 days to have the T fastener bumpers removed. Electronically Signed   By: Markus Daft M.D.   On: 08/26/2020 14:56   NM PET Image Initial (PI) Skull Base To Thigh  Result Date: 08/13/2020 CLINICAL DATA:  Initial treatment strategy for esophageal cancer. EXAM: NUCLEAR MEDICINE PET SKULL BASE TO THIGH  TECHNIQUE: 5.8 mCi F-18 FDG was injected intravenously. Full-ring PET imaging was performed from the skull base to thigh after the radiotracer. CT data was obtained and used for attenuation correction and anatomic localization. Fasting blood glucose: 101 mg/dl COMPARISON:  Multiple exams, including CT scan 07/16/2020 and MRI from 07/17/2020 FINDINGS: Mediastinal blood pool activity: SUV max 2.1 Liver activity: SUV max 3.0 NECK: No significant abnormal hypermetabolic activity in this region. Incidental CT findings: Bilateral common carotid atherosclerotic calcification. CHEST: Bilateral supraclavicular, bilateral paratracheal, and distal paraesophageal hypermetabolic adenopathy. A left paratracheal/paraesophageal lymph node measuring 2.6 cm in short axis on image 63 of series 4 has maximum SUV of 14.0. Right upper paratracheal node 1.5 cm in short axis on image 53 series 4, maximum SUV 16.9. Right supraclavicular node 1.1 cm in short axis on image 45 series 4, maximum SUV 11.5. The circumferential mass of the mid esophagus at about the level of the carina has maximum SUV of 25.6. There is some ground-glass opacity in the right middle lobe which is likely inflammatory based on morphology, maximum SUV 2.8. Incidental CT findings: Coronary, aortic arch, and branch vessel atherosclerotic vascular disease. ABDOMEN/PELVIS: There are about 10 hypermetabolic masses in the left hepatic lobe. Index lesion posteriorly in segment 2 measures about 3.6 by 2.4 cm on image 108 series 4, maximum SUV 15.2. A 2.0 by 1.5 cm oval-shaped hypermetabolic mass inferiorly in the right hepatic lobe has maximum SUV of 12.9. Hypermetabolic mass of the porta hepatis and upper margin of the pancreatic head previously shown to be encasing the common hepatic artery, associated hypermetabolic activity measures about 6.7 by 4.2 cm and has a maximum SUV of 13.9. Small hypermetabolic left celiac lymph node measuring about 0.4 cm in short axis on image  115 series 4, maximum SUV 6.1. Incidental CT findings: Aortoiliac atherosclerotic vascular disease. Sigmoid colon diverticulosis. SKELETON: Large lytic mass of the T4 vertebral body, left pedicle, and left posterior elements with associated hypermetabolic activity measuring about 4.6 by 2.8 cm, maximum SUV 25.2, compatible with  malignancy. High suspicion for dural involvement, cord impingement cannot be excluded. Small focus of accentuated activity at the L2-3 interspinous space, maximum SUV 4.5, more likely to be degenerative than neoplastic. Incidental CT findings: none IMPRESSION: 1. Large lytic metastatic lesion in the T4 vertebra involving the vertebral body and left posterior elements, dural invasion is likely and cord impingement is not excluded. Dedicated thoracic spine MRI with and without contrast recommended for further characterization, if the patient has neurologic symptoms then this should be performed on an urgent basis. 2. Hypermetabolic mass of the mid esophagus with substantial hypermetabolic mediastinal adenopathy. 3. Hypermetabolic masses in the liver, especially concentrated in the left hepatic lobe, with a mass in the porta hepatis which cannot be separated from the pancreatic head and which could be arising from the pancreas, likely envelop ping the hepatic artery. Synchronous pancreatic malignancy cannot be excluded although this could be a metastatic lesion. 4. Small focus of activity at the L3-4 interspinous space is probably degenerative. 5. Other imaging findings of potential clinical significance: Aortic Atherosclerosis (ICD10-I70.0). Coronary atherosclerosis. Sigmoid colon diverticulosis. Electronically Signed   By: Van Clines M.D.   On: 08/13/2020 10:56   IR IMAGING GUIDED PORT INSERTION  Result Date: 08/15/2020 INDICATION: 60 year old male with esophageal cancer, recommended for chemotherapy EXAM: IMPLANTED PORT A CATH PLACEMENT WITH ULTRASOUND AND FLUOROSCOPIC GUIDANCE  MEDICATIONS: None; ANESTHESIA/SEDATION: Moderate (conscious) sedation was employed during this procedure. A total of Versed 1 mg and Fentanyl 50 mcg was administered intravenously. Moderate Sedation Time: 27 minutes. The patient's level of consciousness and vital signs were monitored continuously by radiology nursing throughout the procedure under my direct supervision. FLUOROSCOPY TIME:  0 minutes, 6 seconds (0 mGy) COMPLICATIONS: None immediate. PROCEDURE: The procedure, risks, benefits, and alternatives were explained to the patient. Questions regarding the procedure were encouraged and answered. The patient understands and consents to the procedure. The right neck and chest were prepped with chlorhexidine in a sterile fashion, and a sterile drape was applied covering the operative field. Maximum barrier sterile technique with sterile gowns and gloves were used for the procedure. A timeout was performed prior to the initiation of the procedure. Local anesthesia was provided with 1% lidocaine with epinephrine. After creating a small venotomy incision, a micropuncture kit was utilized to access the internal jugular vein under direct, real-time ultrasound guidance. Ultrasound image documentation was performed. The microwire was kinked to measure appropriate catheter length. A subcutaneous port pocket was then created along the upper chest wall utilizing a combination of sharp and blunt dissection. The pocket was irrigated with sterile saline. A single lumen ISP power injectable port was chosen for placement. The 8 Fr catheter was tunneled from the port pocket site to the venotomy incision. The port was placed in the pocket. The external catheter was trimmed to appropriate length. At the venotomy, an 8 Fr peel-away sheath was placed over a guidewire under fluoroscopic guidance. The catheter was then placed through the sheath and the sheath was removed. Final catheter positioning was confirmed and documented with a  fluoroscopic spot radiograph. The port was accessed with a Huber needle, aspirated and flushed with heparinized saline. The port pocket incision was closed with interrupted 3-0 Vicryl suture then Dermabond was applied, including at the venotomy incision. Dressings were placed. The patient tolerated the procedure well without immediate post procedural complication. IMPRESSION: Successful placement of a right internal jugular approach power injectable Port-A-Cath. The catheter is ready for immediate use. Electronically Signed   By: Michaelle Birks  MD   On: 08/15/2020 16:47    ASSESSMENT & PLAN Christian Newton 60 y.o. 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  The patient is completed palliative radiation therapy and has a PEG tube in place.  Testing of the tumor showed that it is negative for PD-L1 and therefore he will not require immunotherapy in conjunction with his FOLFOX chemotherapy.  Plan is currently to start cycle 1 day 1 of treatment tomorrow.  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 --tumor is negative for PD-L1 Plan: --tomorrow is Cycle 1 Day 1 of FOLFOX chemotherapy --repeat scan in Oct 2022 --RTC in 2 weeks for next cycle.    #Supportive Care -- chemotherapy education complete -- port placed --PEG tube in place -- zofran 41m q8H PRN and compazine 146mPO q6H for nausea -- EMLA cream for port    No orders of the defined types were placed in this encounter.   All questions were answered. The  patient knows to call the clinic with any problems, questions or concerns.  A total of more than 30 minutes were spent on this encounter with face-to-face time and non-face-to-face time, including preparing to see the patient, ordering tests and/or medications, counseling the patient and coordination of care as outlined above.   JoLedell PeoplesMD Department of Hematology/Oncology CoMilant WeGreenspring Surgery Centerhone: [phone]: [phone]: [email or web address]  08/29/2020 4:06 PM

## 2020-08-29 NOTE — Progress Notes (Signed)
I met with Mr Alcindor after his appointment with Dory Peru, dietician.  Mr Heckendorn had his PEG tube placed 08/26/2020.  I demonstrated how to clean and dress the PEG tube.  He was able to explain back to me the steps.  He will be here tomorrow and I will follow up with him.

## 2020-08-29 NOTE — Addendum Note (Signed)
Addended by: Marcell Anger D on: 08/29/2020 11:14 AM   Modules accepted: Orders, SmartSet

## 2020-08-29 NOTE — Telephone Encounter (Signed)
Spoke with patient regarding rescheduling Palliative consult that was scheduled for 08/25/20. Patient declined Palliative services. Will cancel referral and notify referring provider.

## 2020-08-30 ENCOUNTER — Other Ambulatory Visit (HOSPITAL_COMMUNITY): Payer: Self-pay

## 2020-08-30 ENCOUNTER — Inpatient Hospital Stay: Payer: Medicaid Other

## 2020-08-30 ENCOUNTER — Encounter: Payer: Self-pay | Admitting: Hematology and Oncology

## 2020-08-30 ENCOUNTER — Other Ambulatory Visit: Payer: Self-pay

## 2020-08-30 ENCOUNTER — Other Ambulatory Visit: Payer: Self-pay | Admitting: Hematology and Oncology

## 2020-08-30 ENCOUNTER — Ambulatory Visit: Payer: Medicaid Other

## 2020-08-30 VITALS — BP 96/77 | HR 77 | Temp 97.9°F | Resp 16

## 2020-08-30 DIAGNOSIS — C154 Malignant neoplasm of middle third of esophagus: Secondary | ICD-10-CM

## 2020-08-30 DIAGNOSIS — Z95828 Presence of other vascular implants and grafts: Secondary | ICD-10-CM

## 2020-08-30 DIAGNOSIS — Z5111 Encounter for antineoplastic chemotherapy: Secondary | ICD-10-CM | POA: Diagnosis not present

## 2020-08-30 LAB — CBC WITH DIFFERENTIAL (CANCER CENTER ONLY)
Abs Immature Granulocytes: 0.01 10*3/uL (ref 0.00–0.07)
Basophils Absolute: 0 10*3/uL (ref 0.0–0.1)
Basophils Relative: 1 %
Eosinophils Absolute: 0.1 10*3/uL (ref 0.0–0.5)
Eosinophils Relative: 2 %
HCT: 34.7 % — ABNORMAL LOW (ref 39.0–52.0)
Hemoglobin: 11.6 g/dL — ABNORMAL LOW (ref 13.0–17.0)
Immature Granulocytes: 0 %
Lymphocytes Relative: 7 %
Lymphs Abs: 0.3 10*3/uL — ABNORMAL LOW (ref 0.7–4.0)
MCH: 26 pg (ref 26.0–34.0)
MCHC: 33.4 g/dL (ref 30.0–36.0)
MCV: 77.6 fL — ABNORMAL LOW (ref 80.0–100.0)
Monocytes Absolute: 0.5 10*3/uL (ref 0.1–1.0)
Monocytes Relative: 12 %
Neutro Abs: 3.2 10*3/uL (ref 1.7–7.7)
Neutrophils Relative %: 78 %
Platelet Count: 173 10*3/uL (ref 150–400)
RBC: 4.47 MIL/uL (ref 4.22–5.81)
RDW: 14.3 % (ref 11.5–15.5)
WBC Count: 4.1 10*3/uL (ref 4.0–10.5)
nRBC: 0 % (ref 0.0–0.2)

## 2020-08-30 LAB — CMP (CANCER CENTER ONLY)
ALT: 8 U/L (ref 0–44)
AST: 12 U/L — ABNORMAL LOW (ref 15–41)
Albumin: 3.4 g/dL — ABNORMAL LOW (ref 3.5–5.0)
Alkaline Phosphatase: 60 U/L (ref 38–126)
Anion gap: 11 (ref 5–15)
BUN: 22 mg/dL (ref 8–23)
CO2: 24 mmol/L (ref 22–32)
Calcium: 9.6 mg/dL (ref 8.9–10.3)
Chloride: 104 mmol/L (ref 98–111)
Creatinine: 1.06 mg/dL (ref 0.61–1.24)
GFR, Estimated: >60 mL/min (ref >60)
Glucose, Bld: 115 mg/dL — ABNORMAL HIGH (ref 70–99)
Potassium: 4 mmol/L (ref 3.5–5.1)
Sodium: 139 mmol/L (ref 135–145)
Total Bilirubin: 0.8 mg/dL (ref 0.3–1.2)
Total Protein: 7 g/dL (ref 6.5–8.1)

## 2020-08-30 LAB — PHOSPHORUS: Phosphorus: 3.5 mg/dL (ref 2.5–4.6)

## 2020-08-30 LAB — MAGNESIUM: Magnesium: 2.1 mg/dL (ref 1.7–2.4)

## 2020-08-30 MED ORDER — PALONOSETRON HCL INJECTION 0.25 MG/5ML
0.2500 mg | Freq: Once | INTRAVENOUS | Status: AC
  Administered 2020-08-30: 0.25 mg via INTRAVENOUS

## 2020-08-30 MED ORDER — OXALIPLATIN CHEMO INJECTION 100 MG/20ML
85.0000 mg/m2 | Freq: Once | INTRAVENOUS | Status: AC
  Administered 2020-08-30: 135 mg via INTRAVENOUS
  Filled 2020-08-30: qty 20

## 2020-08-30 MED ORDER — FLUOROURACIL CHEMO INJECTION 2.5 GM/50ML
400.0000 mg/m2 | Freq: Once | INTRAVENOUS | Status: AC
  Administered 2020-08-30: 650 mg via INTRAVENOUS
  Filled 2020-08-30: qty 13

## 2020-08-30 MED ORDER — DEXTROSE 5 % IV SOLN
Freq: Once | INTRAVENOUS | Status: AC
  Filled 2020-08-30: qty 250

## 2020-08-30 MED ORDER — LEUCOVORIN CALCIUM INJECTION 350 MG
400.0000 mg/m2 | Freq: Once | INTRAVENOUS | Status: AC
  Administered 2020-08-30: 640 mg via INTRAVENOUS
  Filled 2020-08-30: qty 32

## 2020-08-30 MED ORDER — PALONOSETRON HCL INJECTION 0.25 MG/5ML
INTRAVENOUS | Status: AC
  Filled 2020-08-30: qty 5

## 2020-08-30 MED ORDER — SODIUM CHLORIDE 0.9% FLUSH
10.0000 mL | INTRAVENOUS | Status: AC | PRN
  Administered 2020-08-30: 10 mL
  Filled 2020-08-30: qty 10

## 2020-08-30 MED ORDER — SODIUM CHLORIDE 0.9 % IV SOLN
2400.0000 mg/m2 | INTRAVENOUS | Status: DC
  Administered 2020-08-30: 3850 mg via INTRAVENOUS
  Filled 2020-08-30: qty 77

## 2020-08-30 MED ORDER — PROCHLORPERAZINE MALEATE 10 MG PO TABS
10.0000 mg | ORAL_TABLET | Freq: Four times a day (QID) | ORAL | 0 refills | Status: DC | PRN
Start: 2020-08-30 — End: 2020-09-02
  Filled 2020-08-30: qty 10, 3d supply, fill #0

## 2020-08-30 MED ORDER — SODIUM CHLORIDE 0.9 % IV SOLN
10.0000 mg | Freq: Once | INTRAVENOUS | Status: AC
  Administered 2020-08-30: 10 mg via INTRAVENOUS
  Filled 2020-08-30: qty 10

## 2020-08-30 NOTE — Progress Notes (Signed)
                                                                                                                                                       Patient Name: Christian Newton MRN: [id number] DOB: 14-Sep-1960 Referring Physician: Narda Rutherford Date of Service: 08/22/2020 [facility], [place]                                                        End Of Treatment Note  Diagnoses: C15.4-Malignant neoplasm of middle third of esophagus  Cancer Staging: Metastatic squamous cell esophageal carcinoma   Intent: Curative  Radiation Treatment Dates: 08/04/2020 through 08/22/2020 Site Technique Total Dose (Gy) Dose per Fx (Gy) Completed Fx Beam Energies  Esophagus: Esoph and T5 spine IMRT 30/30 3 10/10 6X  Esophagus: Esoph_Bst IMRT 9/9 3 3/3 6X   Narrative: The patient tolerated radiation therapy relatively well. He was taking steroids during treatment to avoid cord compromise.   Plan: The patient will receive a call in about one month from the radiation oncology department. He will continue follow up with Dr. Lorenso Courier as well. Steroid taper instructions will be provided to the patient as well.   ________________________________________________    Carola Rhine, PAC

## 2020-08-30 NOTE — Patient Instructions (Signed)
[facility]  Discharge Instructions: Thank you for choosing [facility] to provide your oncology and hematology care.   If you have a lab appointment with the [facility], please go directly to the [facility] and check in at the registration area.   Wear comfortable clothing and clothing appropriate for easy access to any Portacath or PICC line.   We strive to give you quality time with your provider. You may need to reschedule your appointment if you arrive late (15 or more minutes).  Arriving late affects you and other patients whose appointments are after yours.  Also, if you miss three or more appointments without notifying the office, you may be dismissed from the clinic at the provider's discretion.      For prescription refill requests, have your pharmacy contact our office and allow 72 hours for refills to be completed.    Today you received the following chemotherapy and/or immunotherapy agents : Leucovorin, Oxaliplatin, 5FU     To help prevent nausea and vomiting after your treatment, we encourage you to take your nausea medication as directed.  BELOW ARE SYMPTOMS THAT SHOULD BE REPORTED IMMEDIATELY: *FEVER GREATER THAN 100.4 F (38 C) OR HIGHER *CHILLS OR SWEATING *NAUSEA AND VOMITING THAT IS NOT CONTROLLED WITH YOUR NAUSEA MEDICATION *UNUSUAL SHORTNESS OF BREATH *UNUSUAL BRUISING OR BLEEDING *URINARY PROBLEMS (pain or burning when urinating, or frequent urination) *BOWEL PROBLEMS (unusual diarrhea, constipation, pain near the anus) TENDERNESS IN MOUTH AND THROAT WITH OR WITHOUT PRESENCE OF ULCERS (sore throat, sores in mouth, or a toothache) UNUSUAL RASH, SWELLING OR PAIN  UNUSUAL VAGINAL DISCHARGE OR ITCHING   Items with * indicate a potential emergency and should be followed up as soon as possible or go to the Emergency Department if any problems should occur.  Please show the CHEMOTHERAPY ALERT CARD or IMMUNOTHERAPY ALERT  CARD at check-in to the Emergency Department and triage nurse.  Should you have questions after your visit or need to cancel or reschedule your appointment, please contact [facility]  Dept: [phone]  and follow the prompts.  Office hours are 8:00 a.m. to 4:30 p.m. Monday - Friday. Please note that voicemails left after 4:00 p.m. may not be returned until the following business day.  We are closed weekends and major holidays. You have access to a nurse at all times for urgent questions. Please call the main number to the clinic Dept: [phone] and follow the prompts.   For any non-urgent questions, you may also contact your provider using MyChart. We now offer e-Visits for anyone 25 and older to request care online for non-urgent symptoms. For details visit [email or web address].   Also download the MyChart app! Go to the app store, search "MyChart", open the app, select [facility], and log in with your MyChart username and password.  Due to Covid, a mask is required upon entering the hospital/clinic. If you do not have a mask, one will be given to you upon arrival. For doctor visits, patients may have 1 support person aged 42 or older with them. For treatment visits, patients cannot have anyone with them due to current Covid guidelines and our immunocompromised population.   Leucovorin injection What is this medication? LEUCOVORIN (loo koe VOR in) is used to prevent or treat the harmful effects of some medicines. This medicine is used to treat anemia caused by a low amount of folic acid in the body. It is also used with 5-fluorouracil (  5-FU) to treatcolon cancer. This medicine may be used for other purposes; ask your health care provider orpharmacist if you have questions. What should I tell my care team before I take this medication? They need to know if you have any of these conditions: anemia from low levels of vitamin B-12 in the blood an unusual or  allergic reaction to leucovorin, folic acid, other medicines, foods, dyes, or preservatives pregnant or trying to get pregnant breast-feeding How should I use this medication? This medicine is for injection into a muscle or into a vein. It is given by ahealth care professional in a hospital or clinic setting. Talk to your pediatrician regarding the use of this medicine in children.Special care may be needed. Overdosage: If you think you have taken too much of this medicine contact apoison control center or emergency room at once. NOTE: This medicine is only for you. Do not share this medicine with others. What if I miss a dose? This does not apply. What may interact with this medication? capecitabine fluorouracil phenobarbital phenytoin primidone trimethoprim-sulfamethoxazole This list may not describe all possible interactions. Give your health care provider a list of all the medicines, herbs, non-prescription drugs, or dietary supplements you use. Also tell them if you smoke, drink alcohol, or use illegaldrugs. Some items may interact with your medicine. What should I watch for while using this medication? Your condition will be monitored carefully while you are receiving thismedicine. This medicine may increase the side effects of 5-fluorouracil, 5-FU. Tell your doctor or health care professional if you have diarrhea or mouth sores that donot get better or that get worse. What side effects may I notice from receiving this medication? Side effects that you should report to your doctor or health care professionalas soon as possible: allergic reactions like skin rash, itching or hives, swelling of the face, lips, or tongue breathing problems fever, infection mouth sores unusual bleeding or bruising unusually weak or tired Side effects that usually do not require medical attention (report to yourdoctor or health care professional if they continue or are bothersome): constipation or  diarrhea loss of appetite nausea, vomiting This list may not describe all possible side effects. Call your doctor for medical advice about side effects. You may report side effects to FDA at1-800-FDA-1088. Where should I keep my medication? This drug is given in a hospital or clinic and will not be stored at home. NOTE: This sheet is a summary. It may not cover all possible information. If you have questions about this medicine, talk to your doctor, pharmacist, orhealth care provider.  2022 Elsevier/Gold Standard (2007-07-15 16:50:29)  Oxaliplatin Injection What is this medication? OXALIPLATIN (ox AL i PLA tin) is a chemotherapy drug. It targets fast dividing cells, like cancer cells, and causes these cells to die. This medicine is usedto treat cancers of the colon and rectum, and many other cancers. This medicine may be used for other purposes; ask your health care provider orpharmacist if you have questions. COMMON BRAND NAME(S): Eloxatin What should I tell my care team before I take this medication? They need to know if you have any of these conditions: heart disease history of irregular heartbeat liver disease low blood counts, like white cells, platelets, or red blood cells lung or breathing disease, like asthma take medicines that treat or prevent blood clots tingling of the fingers or toes, or other nerve disorder an unusual or allergic reaction to oxaliplatin, other chemotherapy, other medicines, foods, dyes, or preservatives pregnant or trying to  get pregnant breast-feeding How should I use this medication? This drug is given as an infusion into a vein. It is administered in a hospitalor clinic by a specially trained health care professional. Talk to your pediatrician regarding the use of this medicine in children.Special care may be needed. Overdosage: If you think you have taken too much of this medicine contact apoison control center or emergency room at once. NOTE: This  medicine is only for you. Do not share this medicine with others. What if I miss a dose? It is important not to miss a dose. Call your doctor or health careprofessional if you are unable to keep an appointment. What may interact with this medication? Do not take this medicine with any of the following medications: cisapride dronedarone pimozide thioridazine This medicine may also interact with the following medications: aspirin and aspirin-like medicines certain medicines that treat or prevent blood clots like warfarin, apixaban, dabigatran, and rivaroxaban cisplatin cyclosporine diuretics medicines for infection like acyclovir, adefovir, amphotericin B, bacitracin, cidofovir, foscarnet, ganciclovir, gentamicin, pentamidine, vancomycin NSAIDs, medicines for pain and inflammation, like ibuprofen or naproxen other medicines that prolong the QT interval (an abnormal heart rhythm) pamidronate zoledronic acid This list may not describe all possible interactions. Give your health care provider a list of all the medicines, herbs, non-prescription drugs, or dietary supplements you use. Also tell them if you smoke, drink alcohol, or use illegaldrugs. Some items may interact with your medicine. What should I watch for while using this medication? Your condition will be monitored carefully while you are receiving thismedicine. You may need blood work done while you are taking this medicine. This medicine may make you feel generally unwell. This is not uncommon as chemotherapy can affect healthy cells as well as cancer cells. Report any side effects. Continue your course of treatment even though you feel ill unless yourhealthcare professional tells you to stop. This medicine can make you more sensitive to cold. Do not drink cold drinks or use ice. Cover exposed skin before coming in contact with cold temperatures or cold objects. When out in cold weather wear warm clothing and cover your mouth and nose  to warm the air that goes into your lungs. Tell your doctor if you getsensitive to the cold. Do not become pregnant while taking this medicine or for 9 months after stopping it. Women should inform their health care professional if they wish to become pregnant or think they might be pregnant. Men should not father a child while taking this medicine and for 6 months after stopping it. There is potential for serious side effects to an unborn child. Talk to your health careprofessional for more information. Do not breast-feed a child while taking this medicine or for 3 months afterstopping it. This medicine has caused ovarian failure in some women. This medicine may make it more difficult to get pregnant. Talk to your health care professional if [place] concerned about your fertility. This medicine has caused decreased sperm counts in some men. This may make it more difficult to father a child. Talk to your health care professional if [place] concerned about your fertility. This medicine may increase your risk of getting an infection. Call your health care professional for advice if you get a fever, chills, or sore throat, or other symptoms of a cold or flu. Do not treat yourself. Try to avoid beingaround people who are sick. Avoid taking medicines that contain aspirin, acetaminophen, ibuprofen, naproxen, or ketoprofen unless instructed by your health care professional.These medicines  may hide a fever. Be careful brushing or flossing your teeth or using a toothpick because you may get an infection or bleed more easily. If you have any dental work done, Primary school teacher you are receiving this medicine. What side effects may I notice from receiving this medication? Side effects that you should report to your doctor or health care professionalas soon as possible: allergic reactions like skin rash, itching or hives, swelling of the face, lips, or tongue breathing problems cough low blood counts - this medicine  may decrease the number of white blood cells, red blood cells, and platelets. You may be at increased risk for infections and bleeding nausea, vomiting pain, redness, or irritation at site where injected pain, tingling, numbness in the hands or feet signs and symptoms of bleeding such as bloody or black, tarry stools; red or dark brown urine; spitting up blood or brown material that looks like coffee grounds; red spots on the skin; unusual bruising or bleeding from the eyes, gums, or nose signs and symptoms of a dangerous change in heartbeat or heart rhythm like chest pain; dizziness; fast, irregular heartbeat; palpitations; feeling faint or lightheaded; falls signs and symptoms of infection like fever; chills; cough; sore throat; pain or trouble passing urine signs and symptoms of liver injury like dark yellow or brown urine; general ill feeling or flu-like symptoms; light-colored stools; loss of appetite; nausea; right upper belly pain; unusually weak or tired; yellowing of the eyes or skin signs and symptoms of low red blood cells or anemia such as unusually weak or tired; feeling faint or lightheaded; falls signs and symptoms of muscle injury like dark urine; trouble passing urine or change in the amount of urine; unusually weak or tired; muscle pain; back pain Side effects that usually do not require medical attention (report to yourdoctor or health care professional if they continue or are bothersome): changes in taste diarrhea gas hair loss loss of appetite mouth sores This list may not describe all possible side effects. Call your doctor for medical advice about side effects. You may report side effects to FDA at1-800-FDA-1088. Where should I keep my medication? This drug is given in a hospital or clinic and will not be stored at home. NOTE: This sheet is a summary. It may not cover all possible information. If you have questions about this medicine, talk to your doctor, pharmacist,  orhealth care provider.  2022 Elsevier/Gold Standard (2018-05-28 12:20:35) Fluorouracil, 5-FU injection What is this medication? FLUOROURACIL, 5-FU (flure oh YOOR a sil) is a chemotherapy drug. It slows the growth of cancer cells. This medicine is used to treat many types of cancer like breast cancer, colon or rectal cancer, pancreatic cancer, and stomachcancer. This medicine may be used for other purposes; ask your health care provider orpharmacist if you have questions. COMMON BRAND NAME(S): Adrucil What should I tell my care team before I take this medication? They need to know if you have any of these conditions: blood disorders dihydropyrimidine dehydrogenase (DPD) deficiency infection (especially a virus infection such as chickenpox, cold sores, or herpes) kidney disease liver disease malnourished, poor nutrition recent or ongoing radiation therapy an unusual or allergic reaction to fluorouracil, other chemotherapy, other medicines, foods, dyes, or preservatives pregnant or trying to get pregnant breast-feeding How should I use this medication? This drug is given as an infusion or injection into a vein. It is administeredin a hospital or clinic by a specially trained health care professional. Talk to your pediatrician regarding the use  of this medicine in children.Special care may be needed. Overdosage: If you think you have taken too much of this medicine contact apoison control center or emergency room at once. NOTE: This medicine is only for you. Do not share this medicine with others. What if I miss a dose? It is important not to miss your dose. Call your doctor or health careprofessional if you are unable to keep an appointment. What may interact with this medication? Do not take this medicine with any of the following medications: live virus vaccines This medicine may also interact with the following medications: medicines that treat or prevent blood clots like warfarin,  enoxaparin, and dalteparin This list may not describe all possible interactions. Give your health care provider a list of all the medicines, herbs, non-prescription drugs, or dietary supplements you use. Also tell them if you smoke, drink alcohol, or use illegaldrugs. Some items may interact with your medicine. What should I watch for while using this medication? Visit your doctor for checks on your progress. This drug may make you feel generally unwell. This is not uncommon, as chemotherapy can affect healthy cells as well as cancer cells. Report any side effects. Continue your course oftreatment even though you feel ill unless your doctor tells you to stop. In some cases, you may be given additional medicines to help with side effects.Follow all directions for their use. Call your doctor or health care professional for advice if you get a fever, chills or sore throat, or other symptoms of a cold or flu. Do not treat yourself. This drug decreases your body's ability to fight infections. Try toavoid being around people who are sick. This medicine may increase your risk to bruise or bleed. Call your doctor orhealth care professional if you notice any unusual bleeding. Be careful brushing and flossing your teeth or using a toothpick because you may get an infection or bleed more easily. If you have any dental work done,tell your dentist you are receiving this medicine. Avoid taking products that contain aspirin, acetaminophen, ibuprofen, naproxen, or ketoprofen unless instructed by your doctor. These medicines may hide afever. Do not become pregnant while taking this medicine. Women should inform their doctor if they wish to become pregnant or think they might be pregnant. There is a potential for serious side effects to an unborn child. Talk to your health care professional or pharmacist for more information. Do not breast-feed aninfant while taking this medicine. Men should inform their doctor if they wish  to father a child. This medicinemay lower sperm counts. Do not treat diarrhea with over the counter products. Contact your doctor ifyou have diarrhea that lasts more than 2 days or if it is severe and watery. This medicine can make you more sensitive to the sun. Keep out of the sun. If you cannot avoid being in the sun, wear protective clothing and use sunscreen.Do not use sun lamps or tanning beds/booths. What side effects may I notice from receiving this medication? Side effects that you should report to your doctor or health care professionalas soon as possible: allergic reactions like skin rash, itching or hives, swelling of the face, lips, or tongue low blood counts - this medicine may decrease the number of white blood cells, red blood cells and platelets. You may be at increased risk for infections and bleeding. signs of infection - fever or chills, cough, sore throat, pain or difficulty passing urine signs of decreased platelets or bleeding - bruising, pinpoint red spots on the skin,   black, tarry stools, blood in the urine signs of decreased red blood cells - unusually weak or tired, fainting spells, lightheadedness breathing problems changes in vision chest pain mouth sores nausea and vomiting pain, swelling, redness at site where injected pain, tingling, numbness in the hands or feet redness, swelling, or sores on hands or feet stomach pain unusual bleeding Side effects that usually do not require medical attention (report to yourdoctor or health care professional if they continue or are bothersome): changes in finger or toe nails diarrhea dry or itchy skin hair loss headache loss of appetite sensitivity of eyes to the light stomach upset unusually teary eyes This list may not describe all possible side effects. Call your doctor for medical advice about side effects. You may report side effects to FDA at1-800-FDA-1088. Where should I keep my medication? This drug is given in  a hospital or clinic and will not be stored at home. NOTE: This sheet is a summary. It may not cover all possible information. If you have questions about this medicine, talk to your doctor, pharmacist, orhealth care provider.  2022 Elsevier/Gold Standard (2018-12-09 15:00:03)  The chemotherapy medication bag should finish at 46 hours, 96 hours, or 7 days. For example, if your pump is scheduled for 46 hours and it was put on at 4:00 p.m., it should finish at 2:00 p.m. the day it is scheduled to come off regardless of your appointment time.     Estimated time to finish at Thursday, 8/11 at 11:15   If the display on your pump reads "Low Volume" and it is beeping, take the batteries out of the pump and come to the cancer center for it to be taken off.   If the pump alarms go off prior to the pump reading "Low Volume" then call [phone] and someone can assist you.  If the plunger comes out and the chemotherapy medication is leaking out, please use your home chemo spill kit to clean up the spill. Do NOT use paper towels or other household products.  If you have problems or questions regarding your pump, please call either [phone] (24 hours a day) or the cancer center Monday-Friday 8:00 a.m.- 4:30 p.m. at the clinic number and we will assist you. If you are unable to get assistance, then go to the nearest Emergency Department and ask the staff to contact the IV team for assistance.

## 2020-08-30 NOTE — Progress Notes (Addendum)
  Radiation Oncology         [phone]) [phone] ________________________________  Name: Kaliq Congleton  [id number]  Date of Service: 08/30/20  DOB: [birth year]   Steroid Taper Instructions   You currently have a prescription for Dexamethasone 4 mg Tablets.    Beginning 09/01/20: Take 1/2 of a tablet (which is 2 mg) twice a day  Beginning 09/08/20: Take 1/2 of a tablet (which is 2 mg) once a day  Beginning 09/15/20: Take 1/2 of a tablet (which is 2 mg) every other day and stop on 09/21/20.   Please call our office if you have any headaches, visual changes, uncontrolled movements, nausea or vomiting.

## 2020-08-31 ENCOUNTER — Encounter: Payer: Self-pay | Admitting: Hematology and Oncology

## 2020-09-01 ENCOUNTER — Encounter: Payer: Self-pay | Admitting: Hematology and Oncology

## 2020-09-01 ENCOUNTER — Other Ambulatory Visit: Payer: Self-pay

## 2020-09-01 ENCOUNTER — Other Ambulatory Visit: Payer: Self-pay | Admitting: Radiation Oncology

## 2020-09-01 ENCOUNTER — Inpatient Hospital Stay: Payer: Medicaid Other

## 2020-09-01 VITALS — BP 90/67 | HR 119 | Temp 97.7°F | Resp 16

## 2020-09-01 DIAGNOSIS — Z5111 Encounter for antineoplastic chemotherapy: Secondary | ICD-10-CM | POA: Diagnosis not present

## 2020-09-01 DIAGNOSIS — Z95828 Presence of other vascular implants and grafts: Secondary | ICD-10-CM

## 2020-09-01 MED ORDER — HEPARIN SOD (PORK) LOCK FLUSH 100 UNIT/ML IV SOLN
500.0000 [IU] | Freq: Once | INTRAVENOUS | Status: DC
  Filled 2020-09-01: qty 5

## 2020-09-01 MED ORDER — SODIUM CHLORIDE 0.9% FLUSH
10.0000 mL | Freq: Once | INTRAVENOUS | Status: AC
  Administered 2020-09-01: 10 mL
  Filled 2020-09-01: qty 10

## 2020-09-01 MED ORDER — DEXAMETHASONE 4 MG PO TABS: ORAL_TABLET | ORAL | 0 refills | Status: DC

## 2020-09-01 NOTE — Progress Notes (Signed)
Met with patient at registration to introduce myself as Arboriculturist and to offer available resources.   Discussed one-time $1000 [employer] grant to assist with personal expenses while going through treatment. Discussed in detail expenses and how they are covered. Approved patient for the grant with the understanding he brings documentation requested. He has a copy of the approval letter and expense sheet along with the Outpatient pharmacy information.  Gave patient GFE(Good Faith Estimate) which shows an estimate of what treatment drugs may cost after automatic uninsured 57% discount. I sent staff message to Mickel Baas in pharmacy regarding patient assistance for mediations but there are no medications available in his treatment plan for assistance.   Gave patient CFA application to complete and advised in detail specifically what is needed for customer service to process application. This will determine if he is eligible for any more than the 57% automatic uninsured discount. Advised he may return completed application with supporting documents to me at his next visit to be sent to Customer Service for processing.  I placed all paperwork in a green folder and gave to patient with his name on it.  He has my card for any additional financial questions or concerns.

## 2020-09-02 ENCOUNTER — Inpatient Hospital Stay: Payer: Medicaid Other | Admitting: General Practice

## 2020-09-02 ENCOUNTER — Inpatient Hospital Stay: Payer: Medicaid Other

## 2020-09-02 ENCOUNTER — Encounter: Payer: Self-pay | Admitting: Hematology and Oncology

## 2020-09-02 ENCOUNTER — Other Ambulatory Visit: Payer: Self-pay | Admitting: Hematology and Oncology

## 2020-09-02 ENCOUNTER — Encounter: Payer: Self-pay | Admitting: General Practice

## 2020-09-02 DIAGNOSIS — Z5111 Encounter for antineoplastic chemotherapy: Secondary | ICD-10-CM | POA: Diagnosis not present

## 2020-09-02 DIAGNOSIS — C154 Malignant neoplasm of middle third of esophagus: Secondary | ICD-10-CM

## 2020-09-02 DIAGNOSIS — Z95828 Presence of other vascular implants and grafts: Secondary | ICD-10-CM

## 2020-09-02 MED ORDER — SODIUM CHLORIDE 0.9% FLUSH
10.0000 mL | Freq: Once | INTRAVENOUS | Status: AC
  Administered 2020-09-02: 10 mL
  Filled 2020-09-02: qty 10

## 2020-09-02 MED ORDER — HEPARIN SOD (PORK) LOCK FLUSH 100 UNIT/ML IV SOLN
500.0000 [IU] | Freq: Once | INTRAVENOUS | Status: AC
  Administered 2020-09-02: 500 [IU]
  Filled 2020-09-02: qty 5

## 2020-09-02 NOTE — Progress Notes (Signed)
Confirmed by Joesphine Bare RN that pt was here to be deaccessed only and no labs are needed today.

## 2020-09-02 NOTE — Progress Notes (Signed)
[place] CSW Progress Notes  Confirmed w [place] that his Social Security disability application was filed on July 28 on his behalf.  He was also signed up for Food Stamps at the same time.  Awaiting [employer] assistance for Medicaid application completion.  Scheduled to meet w [employer] today at [facility].  Edwyna Shell, LCSW Clinical Social Worker Phone:  [phone]

## 2020-09-02 NOTE — Progress Notes (Signed)
[place] CSW Progress Notes  Facilitated meeting w patient and [employer] rep (Kenda Revels).  She took his MEdicaid application and states she will file w DSS today.  Patient has her card for any additional questions on this process.  Edwyna Shell, LCSW Clinical Social Worker Phone:  [phone]

## 2020-09-05 ENCOUNTER — Other Ambulatory Visit: Payer: Self-pay

## 2020-09-05 ENCOUNTER — Other Ambulatory Visit (HOSPITAL_COMMUNITY): Payer: Self-pay | Admitting: Diagnostic Radiology

## 2020-09-05 ENCOUNTER — Ambulatory Visit (HOSPITAL_COMMUNITY)
Admission: RE | Admit: 2020-09-05 | Discharge: 2020-09-05 | Disposition: A | Discharge status: Home / Self Care | Payer: Medicaid Other | Source: Ambulatory Visit | Attending: Diagnostic Radiology | Admitting: Diagnostic Radiology

## 2020-09-05 DIAGNOSIS — D099 Carcinoma in situ, unspecified: Secondary | ICD-10-CM | POA: Diagnosis present

## 2020-09-05 DIAGNOSIS — Z431 Encounter for attention to gastrostomy: Secondary | ICD-10-CM

## 2020-09-06 ENCOUNTER — Inpatient Hospital Stay: Payer: Medicaid Other | Admitting: Nutrition

## 2020-09-06 ENCOUNTER — Inpatient Hospital Stay: Payer: Medicaid Other

## 2020-09-06 DIAGNOSIS — Z5111 Encounter for antineoplastic chemotherapy: Secondary | ICD-10-CM | POA: Diagnosis not present

## 2020-09-06 DIAGNOSIS — C154 Malignant neoplasm of middle third of esophagus: Secondary | ICD-10-CM

## 2020-09-06 DIAGNOSIS — Z95828 Presence of other vascular implants and grafts: Secondary | ICD-10-CM

## 2020-09-06 LAB — CBC WITH DIFFERENTIAL (CANCER CENTER ONLY)
Abs Immature Granulocytes: 0.02 10*3/uL (ref 0.00–0.07)
Basophils Absolute: 0 10*3/uL (ref 0.0–0.1)
Basophils Relative: 0 %
Eosinophils Absolute: 0.2 10*3/uL (ref 0.0–0.5)
Eosinophils Relative: 3 %
HCT: 31.2 % — ABNORMAL LOW (ref 39.0–52.0)
Hemoglobin: 10.6 g/dL — ABNORMAL LOW (ref 13.0–17.0)
Immature Granulocytes: 0 %
Lymphocytes Relative: 5 %
Lymphs Abs: 0.3 10*3/uL — ABNORMAL LOW (ref 0.7–4.0)
MCH: 26.4 pg (ref 26.0–34.0)
MCHC: 34 g/dL (ref 30.0–36.0)
MCV: 77.6 fL — ABNORMAL LOW (ref 80.0–100.0)
Monocytes Absolute: 0.1 10*3/uL (ref 0.1–1.0)
Monocytes Relative: 2 %
Neutro Abs: 4.6 10*3/uL (ref 1.7–7.7)
Neutrophils Relative %: 90 %
Platelet Count: 124 10*3/uL — ABNORMAL LOW (ref 150–400)
RBC: 4.02 MIL/uL — ABNORMAL LOW (ref 4.22–5.81)
RDW: 14.7 % (ref 11.5–15.5)
WBC Count: 5.2 10*3/uL (ref 4.0–10.5)
nRBC: 0 % (ref 0.0–0.2)

## 2020-09-06 LAB — MAGNESIUM: Magnesium: 2.1 mg/dL (ref 1.7–2.4)

## 2020-09-06 LAB — CMP (CANCER CENTER ONLY)
ALT: 14 U/L (ref 0–44)
AST: 14 U/L — ABNORMAL LOW (ref 15–41)
Albumin: 3.3 g/dL — ABNORMAL LOW (ref 3.5–5.0)
Alkaline Phosphatase: 58 U/L (ref 38–126)
Anion gap: 9 (ref 5–15)
BUN: 26 mg/dL — ABNORMAL HIGH (ref 8–23)
CO2: 25 mmol/L (ref 22–32)
Calcium: 9.1 mg/dL (ref 8.9–10.3)
Chloride: 102 mmol/L (ref 98–111)
Creatinine: 1.04 mg/dL (ref 0.61–1.24)
GFR, Estimated: >60 mL/min (ref >60)
Glucose, Bld: 121 mg/dL — ABNORMAL HIGH (ref 70–99)
Potassium: 4.4 mmol/L (ref 3.5–5.1)
Sodium: 136 mmol/L (ref 135–145)
Total Bilirubin: 0.5 mg/dL (ref 0.3–1.2)
Total Protein: 6.6 g/dL (ref 6.5–8.1)

## 2020-09-06 LAB — PHOSPHORUS: Phosphorus: 3.7 mg/dL (ref 2.5–4.6)

## 2020-09-06 MED ORDER — HEPARIN SOD (PORK) LOCK FLUSH 100 UNIT/ML IV SOLN
500.0000 [IU] | Freq: Once | INTRAVENOUS | Status: AC
  Administered 2020-09-06: 500 [IU]

## 2020-09-06 MED ORDER — SODIUM CHLORIDE 0.9% FLUSH
10.0000 mL | Freq: Once | INTRAVENOUS | Status: AC
  Administered 2020-09-06: 10 mL

## 2020-09-06 NOTE — Progress Notes (Signed)
Nutrition follow-up completed with patient in office.  Patient is receiving treatment for metastatic esophageal cancer.  He is followed by Dr. Lorenso Courier.  Weight improved to 106.4 pounds today, increased from 102.7 pounds August 8.  Labs include glucose 121, BUN 26, albumin 3.3, Magnesium 2.1.  Phosphorus is pending.  No signs of refeeding syndrome.  Patient reports he is eating a little including watermelon, fruit cocktail, [employer] sausages, and [employer]ese[employer] peanut butter cups.   Reports using 2 cartons of Osmolite 1.5 daily via G-tube and is flushing feeding tube with 60 mL of free water before and after each bolus feeding.  States he tolerates 1 carton at a time without difficulty. 2 cartons of Osmolite 1.5 provides 710 cal, 29.8 g protein Denies nausea, vomiting, constipation, diarrhea.  Estimated nutrition needs: 1600-1865 cal, 85-96 g protein, 1.7 L fluid.  Nutrition diagnosis: Inadequate oral intake continues. Severe malnutrition continues.  Intervention: Educated patient to increase Osmolite 1.5-1 carton via G-tube with 60 mL free water before and after bolus feedings 4 times daily.  This will provide 1420 cal, 59.6 g protein. (89% minimum calorie needs, 70% minimum protein needs) Continue slow infusion of tube feeding. Continue soft foods as tolerated. Recommend an additional 24 ounces of water daily either by mouth or via feeding tube. Will increase tube feeding based on oral intake and weight trends.  Monitoring, evaluation, goals: Patient will tolerate increased calories and protein to minimize weight loss.  Next visit: To be scheduled in 2-3 weeks.  **Disclaimer: This note was dictated with voice recognition software. Similar sounding words can inadvertently be transcribed and this note may contain transcription errors which may not have been corrected upon publication of note.**

## 2020-09-08 ENCOUNTER — Telehealth: Payer: Self-pay | Admitting: Hematology and Oncology

## 2020-09-08 NOTE — Telephone Encounter (Signed)
Scheduled appts per 8/16 sch msg. Spoke to pt who is aware of appts for next week. Per pt request we will print an updated calendar for him with his other appts.

## 2020-09-09 ENCOUNTER — Other Ambulatory Visit (HOSPITAL_COMMUNITY): Payer: Self-pay

## 2020-09-09 ENCOUNTER — Inpatient Hospital Stay: Payer: Medicaid Other

## 2020-09-09 ENCOUNTER — Encounter: Payer: Self-pay | Admitting: Hematology and Oncology

## 2020-09-09 MED ORDER — DEXAMETHASONE 4 MG PO TABS
ORAL_TABLET | ORAL | 0 refills | Status: DC
  Filled 2020-09-09: qty 60, 30d supply, fill #0

## 2020-09-12 ENCOUNTER — Encounter: Payer: Self-pay | Admitting: Hematology and Oncology

## 2020-09-12 ENCOUNTER — Other Ambulatory Visit: Payer: Self-pay | Admitting: *Deleted

## 2020-09-12 ENCOUNTER — Other Ambulatory Visit: Payer: Self-pay

## 2020-09-12 ENCOUNTER — Other Ambulatory Visit (HOSPITAL_COMMUNITY): Payer: Self-pay

## 2020-09-12 ENCOUNTER — Inpatient Hospital Stay (HOSPITAL_BASED_OUTPATIENT_CLINIC_OR_DEPARTMENT_OTHER): Payer: Medicaid Other | Admitting: Hematology and Oncology

## 2020-09-12 ENCOUNTER — Inpatient Hospital Stay: Payer: Medicaid Other

## 2020-09-12 VITALS — BP 105/72 | HR 102 | Temp 97.5°F | Resp 18 | Ht 67.0 in | Wt 111.2 lb

## 2020-09-12 DIAGNOSIS — Z95828 Presence of other vascular implants and grafts: Secondary | ICD-10-CM

## 2020-09-12 DIAGNOSIS — C154 Malignant neoplasm of middle third of esophagus: Secondary | ICD-10-CM

## 2020-09-12 DIAGNOSIS — C159 Malignant neoplasm of esophagus, unspecified: Secondary | ICD-10-CM

## 2020-09-12 DIAGNOSIS — K2289 Other specified disease of esophagus: Secondary | ICD-10-CM

## 2020-09-12 DIAGNOSIS — Z5111 Encounter for antineoplastic chemotherapy: Secondary | ICD-10-CM | POA: Diagnosis not present

## 2020-09-12 LAB — CMP (CANCER CENTER ONLY)
ALT: 13 U/L (ref 0–44)
AST: 12 U/L — ABNORMAL LOW (ref 15–41)
Albumin: 3.1 g/dL — ABNORMAL LOW (ref 3.5–5.0)
Alkaline Phosphatase: 62 U/L (ref 38–126)
Anion gap: 8 (ref 5–15)
BUN: 15 mg/dL (ref 8–23)
CO2: 26 mmol/L (ref 22–32)
Calcium: 8.8 mg/dL — ABNORMAL LOW (ref 8.9–10.3)
Chloride: 98 mmol/L (ref 98–111)
Creatinine: 0.78 mg/dL (ref 0.61–1.24)
GFR, Estimated: >60 mL/min (ref >60)
Glucose, Bld: 97 mg/dL (ref 70–99)
Potassium: 4 mmol/L (ref 3.5–5.1)
Sodium: 132 mmol/L — ABNORMAL LOW (ref 135–145)
Total Bilirubin: 0.3 mg/dL (ref 0.3–1.2)
Total Protein: 6.2 g/dL — ABNORMAL LOW (ref 6.5–8.1)

## 2020-09-12 LAB — CBC WITH DIFFERENTIAL (CANCER CENTER ONLY)
Abs Immature Granulocytes: 0 10*3/uL (ref 0.00–0.07)
Basophils Absolute: 0 10*3/uL (ref 0.0–0.1)
Basophils Relative: 1 %
Eosinophils Absolute: 0.1 10*3/uL (ref 0.0–0.5)
Eosinophils Relative: 6 %
HCT: 26.8 % — ABNORMAL LOW (ref 39.0–52.0)
Hemoglobin: 9.1 g/dL — ABNORMAL LOW (ref 13.0–17.0)
Immature Granulocytes: 0 %
Lymphocytes Relative: 12 %
Lymphs Abs: 0.2 10*3/uL — ABNORMAL LOW (ref 0.7–4.0)
MCH: 25.8 pg — ABNORMAL LOW (ref 26.0–34.0)
MCHC: 34 g/dL (ref 30.0–36.0)
MCV: 75.9 fL — ABNORMAL LOW (ref 80.0–100.0)
Monocytes Absolute: 0.2 10*3/uL (ref 0.1–1.0)
Monocytes Relative: 13 %
Neutro Abs: 1.3 10*3/uL — ABNORMAL LOW (ref 1.7–7.7)
Neutrophils Relative %: 68 %
Platelet Count: 103 10*3/uL — ABNORMAL LOW (ref 150–400)
RBC: 3.53 MIL/uL — ABNORMAL LOW (ref 4.22–5.81)
RDW: 14.8 % (ref 11.5–15.5)
WBC Count: 1.9 10*3/uL — ABNORMAL LOW (ref 4.0–10.5)
nRBC: 0 % (ref 0.0–0.2)

## 2020-09-12 LAB — PHOSPHORUS: Phosphorus: 3.2 mg/dL (ref 2.5–4.6)

## 2020-09-12 LAB — MAGNESIUM: Magnesium: 1.9 mg/dL (ref 1.7–2.4)

## 2020-09-12 MED ORDER — SENNOSIDES-DOCUSATE SODIUM 8.6-50 MG PO TABS
1.0000 | ORAL_TABLET | Freq: Every day | ORAL | 3 refills | Status: DC
  Filled 2020-09-12: qty 60, 30d supply, fill #0

## 2020-09-12 MED ORDER — ONDANSETRON HCL 8 MG PO TABS
8.0000 mg | ORAL_TABLET | Freq: Three times a day (TID) | ORAL | 0 refills | Status: DC | PRN
Start: 2020-09-12 — End: 2020-09-28
  Filled 2020-09-12: qty 30, 10d supply, fill #0

## 2020-09-12 MED ORDER — PROCHLORPERAZINE MALEATE 10 MG PO TABS
ORAL_TABLET | ORAL | 3 refills | Status: DC
  Filled 2020-09-12: qty 30, 7d supply, fill #0

## 2020-09-12 MED ORDER — SODIUM CHLORIDE 0.9% FLUSH
10.0000 mL | Freq: Once | INTRAVENOUS | Status: AC
  Administered 2020-09-12: 10 mL

## 2020-09-12 NOTE — Progress Notes (Signed)
Pt requested to stay accessed due to having infusion in the am and will be going home with a pump. Dressing and biopatch applied.

## 2020-09-12 NOTE — Progress Notes (Signed)
Met with patient at registration whom provided letter of support and CFA application. Patient received gift card from grant.  Placed CFA application in outer-office mail to [employer] attn: Therapist, art. They will process application and notify patient via mail if additional information is needed or for determination in about 3 weeks.  He has my card for any additional financial questions or concerns.

## 2020-09-12 NOTE — Progress Notes (Signed)
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 CT angio chest showed diffuse esophageal wall thickening and haziness as well as surrounding posterior mediastinal haziness/fat stranding. Findings concerning for malignancy 07/17/2020: MRI abdomen showed signs of malignancy with mass in the region of the hepatogastric recess near the pancreatic head that narrows vascular structures and is associated with hepatic metastatic disease 07/18/2020: EGD showed a large ulcerating mass with friable mucosa and bleeding on contact in the middle 1/3 of the esophagus. Biopsy was obtained, results consistent with squamous cell carcinoma. 07/28/2020: establish care with Dr. Lorenso Courier  08/30/2020: Cycle 1 Day 1 of FOLFOX chemotherapy 09/12/2020: Cycle 2 Day 1 of FOLFOX chemotherapy  Interval History:  Christian [place] 60 y.o. 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 The patient's last visit was on 08/29/2020. In the interim since the last visit he has completed Cycle 1 of treatment.  On exam today Mr. Weese notes he tolerated his last cycle of chemotherapy well.  He had 1 day of diarrhea during the course of his treatment.  He notes that he has been taking in fluid and pills by mouth but for the most part is getting his nutrition via his PEG tube.  He reports that having to see his weight increased to 111 pounds from 102 pounds up visit.  He is requesting light today.  He otherwise denies any fevers, chills, sweats, nausea, vomiting, or abdominal pain.  Full 10 point ROS is listed below.   He is willing and able to proceed with treatment today.  MEDICAL HISTORY:  Past Medical History:  Diagnosis Date   Cat bite of left hand 05/11/2015   COPD (chronic obstructive pulmonary disease) (HCC)    Malignant neoplasm of middle third of esophagus ([place]) 07/31/2020   Smoker     SURGICAL HISTORY: Past Surgical History:  Procedure Laterality Date   APPENDECTOMY     BIOPSY  07/18/2020   Procedure: BIOPSY;  Surgeon: Mauri Pole, MD;  Location: [facility];  Service: Endoscopy;;   ESOPHAGOGASTRODUODENOSCOPY (EGD) WITH PROPOFOL N/A 07/18/2020   Procedure: ESOPHAGOGASTRODUODENOSCOPY (EGD) WITH PROPOFOL;  Surgeon: Mauri Pole, MD;  Location: [facility];  Service: Endoscopy;  Laterality: N/A;   I & D EXTREMITY Left 05/13/2015   Procedure: INCISION AND DRAINAGE OF LEFT EXTREMITY;  Surgeon: Charlotte Crumb, MD;  Location: [facility];  Service: Orthopedics;  Laterality: Left;   INCISION AND DRAINAGE Left 05/13/2015   IR GASTROSTOMY TUBE MOD SED  08/26/2020   IR IMAGING GUIDED PORT INSERTION  08/15/2020    SOCIAL HISTORY: Social History   Socioeconomic History   Marital status: Single    Spouse name: Not on file   Number of children: Not on file   Years of education: Not on file   Highest education level: Not on file  Occupational History   Not on file  Tobacco Use   Smoking status: Every Day    Packs/day: 0.25    Years: 20.00    Pack years: 5.00    Types: Cigarettes   Smokeless tobacco: Never  Vaping Use   Vaping Use: Never used  Substance and Sexual Activity   Alcohol use: No   Drug use: No   Sexual activity: Not on file  Other Topics Concern   Not on file  Social History Narrative   Not on file   Social Determinants of Health   Financial Resource Strain: High Risk   Difficulty of Paying Living Expenses: Very hard  Food Insecurity: Food Insecurity Present   Worried About Running Out of Food in the Last Year: Sometimes true   Ran Out of Food in the Last  Year: Sometimes true  Transportation Needs: Unmet Transportation Needs   Lack of Transportation (Medical): Yes   Lack of Transportation (Non-Medical): Yes  Physical Activity: Not on file  Stress: Stress Concern Present   Feeling of Stress : To some extent  Social Connections: Unknown   Frequency of Communication with Friends and Family: Twice a week   Frequency of Social Gatherings with Friends and Family: Once a week   Attends Religious Services: Never   Printmaker: Not on file   Attends Archivist Meetings: Not on file   Marital Status: Not on file  Intimate Partner Violence: Not on file    FAMILY HISTORY: No family history on file.  ALLERGIES:  has No Known Allergies.  MEDICATIONS:  Current Outpatient Medications  Medication Sig Dispense Refill   acetaminophen (TYLENOL) 500 MG tablet Take 500 mg by mouth every 6 (six) hours as needed for mild pain or headache.     dexamethasone (DECADRON) 4 MG tablet Please follow taper instructions given in clinic 60 tablet 0   dexamethasone (DECADRON) 4 MG tablet Please follow taper instructions given in clinic 60 tablet 0   food thickener (SIMPLYTHICK, NECTAR/LEVEL 2/MILDLY THICK,) GEL Take 1 packet by mouth as needed. 30 packet 0   lidocaine-prilocaine (EMLA) cream Apply 1 application topically as needed. 30 g 0   metoprolol tartrate (LOPRESSOR) 25 MG tablet TAKE 1 TABLET(25 MG) BY MOUTH TWICE DAILY (Patient not taking: Reported on 08/25/2020) 60 tablet 0   Nutritional Supplements (FEEDING SUPPLEMENT, OSMOLITE 1.5 CAL,) LIQD Begin one half carton Osmolite 1.5 via G-tube 4 times daily with 60 mL free water before and after bolus feeding.  Increase by one half cartons daily to goal rate of 5 cartons a day split into 4 feedings.  Drink or flush tube with additional 240 mL free water 3 times daily.  Provides 1775 cal, 74.5 g protein and 2105 mL free water.(Greater than 100% minimum calorie needs and 88% minimum  protein needs) 1185 mL 6   ondansetron (ZOFRAN) 8 MG tablet Take 1 tablet (8 mg total) by mouth every 8 (eight) hours as needed. 30 tablet 0   oxyCODONE (OXY IR/ROXICODONE) 5 MG immediate release tablet Take 1 tablet (5 mg total) by mouth every 6 (six) hours as needed for moderate pain or breakthrough pain. 60 tablet 0   pantoprazole (PROTONIX) 40 MG tablet Take 1 tablet (40 mg total) by mouth daily. (Patient not taking: Reported on 08/29/2020) 30 tablet 1   prochlorperazine (COMPAZINE) 10 MG tablet TAKE 1 TABLET(10 MG) BY MOUTH EVERY 6 HOURS AS NEEDED FOR NAUSEA OR VOMITING 30 tablet 3   senna-docusate (SENNA S) 8.6-50 MG tablet Take 1-2 tablets daily for constipation as directed 60 tablet 3   No current facility-administered medications for this visit.    REVIEW OF SYSTEMS:   Constitutional: ( - ) fevers, ( - )  chills , ( - ) night sweats Eyes: ( - )  blurriness of vision, ( - ) double vision, ( - ) watery eyes Ears, nose, mouth, throat, and face: ( - ) mucositis, ( - ) sore throat Respiratory: ( - ) cough, ( - ) dyspnea, ( - ) wheezes Cardiovascular: ( - ) palpitation, ( - ) chest discomfort, ( - ) lower extremity swelling Gastrointestinal:  ( - ) nausea, ( - ) heartburn, ( - ) change in bowel habits Skin: ( - ) abnormal skin rashes Lymphatics: ( - ) new lymphadenopathy, ( - ) easy bruising Neurological: ( - ) numbness, ( - ) tingling, ( - ) new weaknesses Behavioral/Psych: ( - ) mood change, ( - ) new changes  All other systems were reviewed with the patient and are negative.  PHYSICAL EXAMINATION: ECOG PERFORMANCE STATUS: 2 - Symptomatic, <50% confined to bed  Vitals:   09/12/20 1257  BP: 105/72  Pulse: (!) 102  Resp: 18  Temp: (!) 97.5 F (36.4 C)  SpO2: 100%   Filed Weights   09/12/20 1257  Weight: 111 lb 3.2 oz (50.4 kg)    GENERAL: Chronically ill-appearing middle-aged African-American male, alert, no distress and comfortable SKIN: skin color, texture, turgor are  normal, no rashes or significant lesions EYES: conjunctiva are pink and non-injected, sclera clear LUNGS: clear to auscultation and percussion with normal breathing effort HEART: regular rate & rhythm and no murmurs and no lower extremity edema ABDOMEN: PEG tube in place at left side of abdomen. PSYCH: alert & oriented x 3, fluent speech NEURO: no focal motor/sensory deficits  LABORATORY DATA:  I have reviewed the data as listed CBC Latest Ref Rng & Units 09/12/2020 09/06/2020 08/30/2020  WBC 4.0 - 10.5 K/uL 1.9(L) 5.2 4.1  Hemoglobin 13.0 - 17.0 g/dL 9.1(L) 10.6(L) 11.6(L)  Hematocrit 39.0 - 52.0 % 26.8(L) 31.2(L) 34.7(L)  Platelets 150 - 400 K/uL 103(L) 124(L) 173    CMP Latest Ref Rng & Units 09/12/2020 09/06/2020 08/30/2020  Glucose 70 - 99 mg/dL 97 121(H) 115(H)  BUN 8 - 23 mg/dL 15 26(H) 22  Creatinine 0.61 - 1.24 mg/dL 0.78 1.04 1.06  Sodium 135 - 145 mmol/L 132(L) 136 139  Potassium 3.5 - 5.1 mmol/L 4.0 4.4 4.0  Chloride 98 - 111 mmol/L 98 102 104  CO2 22 - 32 mmol/L _0 Calcium 8.9 - 10.3 mg/dL 8.8(L) 9.1 9.6  Total Protein 6.5 - 8.1 g/dL 6.2(L) 6.6 7.0  Total Bilirubin 0.3 - 1.2 mg/dL 0.3 0.5 0.8  Alkaline Phos 38 - 126 U/L 62 58 60  AST 15 - 41 U/L 12(L) 14(L) 12(L)  ALT 0 - 44 U/L _1 RADIOGRAPHIC STUDIES: IR Gastrostomy Tube  Result Date: 08/26/2020 INDICATION: 60 year old with malignant neoplasm of middle third of the esophagus. Patient has esophageal dysphagia and needs percutaneous gastrostomy tube for nutrition. EXAM: PERCUTANEOUS GASTROSTOMY TUBE WITH FLUOROSCOPIC GUIDANCE Physician: Stephan Minister. Anselm Pancoast, MD MEDICATIONS: Ancef 2 g; Antibiotics were administered within 1 hour of the procedure. Glucagon 0.5 mg IV ANESTHESIA/SEDATION: Versed 2.0 mg IV; Fentanyl 100 mcg IV Moderate Sedation Time:  33 minutes The patient was continuously monitored during the procedure by the interventional radiology nurse under my direct supervision. FLUOROSCOPY TIME:  Fluoroscopy Time: 4  minutes, 54 seconds, 18 mGy COMPLICATIONS: None immediate. PROCEDURE: The procedure was explained to the patient. The risks and benefits of the procedure were discussed and the patient's questions were addressed. Informed consent was obtained from the patient. The patient was placed on the interventional table.  Nasogastric tube was placed with fluoroscopy. The anterior abdomen was prepped and draped in sterile fashion. Maximal barrier sterile technique was utilized including caps, mask, sterile gowns, sterile gloves, sterile drape, hand hygiene and skin antiseptic. Stomach was inflated with air through the nasogastric tube. The skin and subcutaneous tissues were anesthetized with 1% lidocaine. Using fluoroscopic guidance, T fastener was deployed in the stomach. Total of 3 T-fasteners were placed in a triangular configuration. Small incision was made between the T-fasteners. Needle was directed into the stomach using fluoroscopic guidance and super stiff Amplatz wire was advanced in the stomach. The tract was dilated to accommodate a 22 French peel-away sheath. A 20 French Entuit gastrostomy tube was advanced over the wire. The balloon was inflated with approximately 10 mL of water. Contrast injection confirmed placement in the stomach. FINDINGS: Stomach was adequately insufflated for the procedure. Three T-fasteners were successfully placed in the stomach. 74 French gastrostomy tube was placed between the T-fasteners. Contrast injection confirmed placement in the stomach. IMPRESSION: 1. Successful placement of a fluoroscopic guided balloon retention gastrostomy tube. 2. Patient has 3 T-fasteners. Patient will be scheduled to return in 10-14 days to have the T fastener bumpers removed. Electronically Signed   By: Markus Daft M.D.   On: 08/26/2020 14:56   IR IMAGING GUIDED PORT INSERTION  Result Date: 08/15/2020 INDICATION: 60 year old male with esophageal cancer, recommended for chemotherapy EXAM: IMPLANTED PORT A  CATH PLACEMENT WITH ULTRASOUND AND FLUOROSCOPIC GUIDANCE MEDICATIONS: None; ANESTHESIA/SEDATION: Moderate (conscious) sedation was employed during this procedure. A total of Versed 1 mg and Fentanyl 50 mcg was administered intravenously. Moderate Sedation Time: 27 minutes. The patient's level of consciousness and vital signs were monitored continuously by radiology nursing throughout the procedure under my direct supervision. FLUOROSCOPY TIME:  0 minutes, 6 seconds (0 mGy) COMPLICATIONS: None immediate. PROCEDURE: The procedure, risks, benefits, and alternatives were explained to the patient. Questions regarding the procedure were encouraged and answered. The patient understands and consents to the procedure. The right neck and chest were prepped with chlorhexidine in a sterile fashion, and a sterile drape was applied covering the operative field. Maximum barrier sterile technique with sterile gowns and gloves were used for the procedure. A timeout was performed prior to the initiation of the procedure. Local anesthesia was provided with 1% lidocaine with epinephrine. After creating a small venotomy incision, a micropuncture kit was utilized to access the internal jugular vein under direct, real-time ultrasound guidance. Ultrasound image documentation was performed. The microwire was kinked to measure appropriate catheter length. A subcutaneous port pocket was then created along the upper chest wall utilizing a combination of sharp and blunt dissection. The pocket was irrigated with sterile saline. A single lumen ISP power injectable port was chosen for placement. The 8 Fr catheter was tunneled from the port pocket site to the venotomy incision. The port was placed in the pocket. The external catheter was trimmed to appropriate length. At the venotomy, an 8 Fr peel-away sheath was placed over a guidewire under fluoroscopic guidance. The catheter was then placed through the sheath and the sheath was removed. Final  catheter positioning was confirmed and documented with a fluoroscopic spot radiograph. The port was accessed with a Huber needle, aspirated and flushed with heparinized saline. The port pocket incision was closed with interrupted 3-0 Vicryl suture then Dermabond was applied, including at the venotomy incision. Dressings were placed. The patient tolerated the procedure well without immediate post procedural complication. IMPRESSION: Successful placement of a right internal jugular  approach power injectable Port-A-Cath. The catheter is ready for immediate use. Electronically Signed   By: Michaelle Birks MD   On: 08/15/2020 16:47    ASSESSMENT & PLAN [name] 60 y.o. 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  The patient is completed palliative radiation therapy and has a PEG tube in place.  Testing of the tumor showed that it is negative for PD-L1 and therefore he will not require immunotherapy in conjunction with his FOLFOX chemotherapy.  Plan is currently to start cycle 1 day 1 of treatment tomorrow.  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 --tumor is negative for PD-L1 Plan: --tomorrow is Cycle 2 Day 1 of FOLFOX chemotherapy --repeat scan in Oct 2022 --RTC in 2 weeks for next cycle.    #Supportive Care -- chemotherapy education complete -- port placed --PEG tube in place -- zofran 35m q8H PRN and compazine 199mPO q6H for nausea -- EMLA cream for port   No orders of the defined types were placed in  this encounter.  All questions were answered. The patient knows to call the clinic with any problems, questions or concerns.  A total of more than 30 minutes were spent on this encounter with face-to-face time and non-face-to-face time, including preparing to see the patient, ordering tests and/or medications, counseling the patient and coordination of care as outlined above.   JoLedell PeoplesMD Department of Hematology/Oncology CoBarbertont WePhoenix Er & Medical Hospitalhone: [phone]: [phone]: [email or web address]  09/12/2020 5:03 PM

## 2020-09-12 NOTE — Progress Notes (Signed)
Nutrition  2 cases of osmolite 1.5 provided to nursing to give to patient.  Gustie Bobb B. Zenia Resides, [place], [place] Registered Dietitian [phone] (mobile)

## 2020-09-13 ENCOUNTER — Other Ambulatory Visit: Payer: Self-pay | Admitting: Hematology and Oncology

## 2020-09-13 ENCOUNTER — Inpatient Hospital Stay: Payer: Medicaid Other

## 2020-09-13 VITALS — BP 107/81 | HR 100 | Temp 98.0°F | Resp 20

## 2020-09-13 DIAGNOSIS — Z5111 Encounter for antineoplastic chemotherapy: Secondary | ICD-10-CM | POA: Diagnosis not present

## 2020-09-13 DIAGNOSIS — C154 Malignant neoplasm of middle third of esophagus: Secondary | ICD-10-CM

## 2020-09-13 MED ORDER — DEXTROSE 5 % IV SOLN
400.0000 mg/m2 | Freq: Once | INTRAVENOUS | Status: AC
  Administered 2020-09-13: 640 mg via INTRAVENOUS
  Filled 2020-09-13: qty 32

## 2020-09-13 MED ORDER — PALONOSETRON HCL INJECTION 0.25 MG/5ML
0.2500 mg | Freq: Once | INTRAVENOUS | Status: AC
  Administered 2020-09-13: 0.25 mg via INTRAVENOUS
  Filled 2020-09-13: qty 5

## 2020-09-13 MED ORDER — OXALIPLATIN CHEMO INJECTION 100 MG/20ML
85.0000 mg/m2 | Freq: Once | INTRAVENOUS | Status: AC
  Administered 2020-09-13: 135 mg via INTRAVENOUS
  Filled 2020-09-13: qty 10

## 2020-09-13 MED ORDER — SODIUM CHLORIDE 0.9 % IV SOLN
2400.0000 mg/m2 | INTRAVENOUS | Status: DC
  Administered 2020-09-13: 3850 mg via INTRAVENOUS
  Filled 2020-09-13: qty 77

## 2020-09-13 MED ORDER — FLUOROURACIL CHEMO INJECTION 2.5 GM/50ML
400.0000 mg/m2 | Freq: Once | INTRAVENOUS | Status: AC
  Administered 2020-09-13: 650 mg via INTRAVENOUS
  Filled 2020-09-13: qty 13

## 2020-09-13 MED ORDER — SODIUM CHLORIDE 0.9 % IV SOLN
10.0000 mg | Freq: Once | INTRAVENOUS | Status: AC
  Administered 2020-09-13: 10 mg via INTRAVENOUS
  Filled 2020-09-13: qty 1

## 2020-09-13 MED ORDER — DEXTROSE 5 % IV SOLN: Freq: Once | INTRAVENOUS | Status: AC

## 2020-09-13 NOTE — Patient Instructions (Signed)
Christian Newton   Discharge Instructions: Thank you for choosing [facility] to provide your oncology and hematology care.   If you have a lab appointment with the [facility], please go directly to the [facility] and check in at the registration area.   Wear comfortable clothing and clothing appropriate for easy access to any Portacath or PICC line.   We strive to give you quality time with your provider. You may need to reschedule your appointment if you arrive late (15 or more minutes).  Arriving late affects you and other patients whose appointments are after yours.  Also, if you miss three or more appointments without notifying the office, you may be dismissed from the clinic at the provider's discretion.      For prescription refill requests, have your pharmacy contact our office and allow 72 hours for refills to be completed.    Today you received the following chemotherapy and/or immunotherapy agents Oxaliplatin (ELOXATIN), Leucovorin & Flourouracil (ADRUCIL).      To help prevent nausea and vomiting after your treatment, we encourage you to take your nausea medication as directed.  BELOW ARE SYMPTOMS THAT SHOULD BE REPORTED IMMEDIATELY: *FEVER GREATER THAN 100.4 F (38 C) OR HIGHER *CHILLS OR SWEATING *NAUSEA AND VOMITING THAT IS NOT CONTROLLED WITH YOUR NAUSEA MEDICATION *UNUSUAL SHORTNESS OF BREATH *UNUSUAL BRUISING OR BLEEDING *URINARY PROBLEMS (pain or burning when urinating, or frequent urination) *BOWEL PROBLEMS (unusual diarrhea, constipation, pain near the anus) TENDERNESS IN MOUTH AND THROAT WITH OR WITHOUT PRESENCE OF ULCERS (sore throat, sores in mouth, or a toothache) UNUSUAL RASH, SWELLING OR PAIN  UNUSUAL VAGINAL DISCHARGE OR ITCHING   Items with * indicate a potential emergency and should be followed up as soon as possible or go to the Emergency Department if any problems should occur.  Please show the CHEMOTHERAPY ALERT  CARD or IMMUNOTHERAPY ALERT CARD at check-in to the Emergency Department and triage nurse.  Should you have questions after your visit or need to cancel or reschedule your appointment, please contact [facility]  Dept: [phone]  and follow the prompts.  Office hours are 8:00 a.m. to 4:30 p.m. Monday - Friday. Please note that voicemails left after 4:00 p.m. may not be returned until the following business day.  We are closed weekends and major holidays. You have access to a nurse at all times for urgent questions. Please call the main number to the clinic Dept: [phone] and follow the prompts.   For any non-urgent questions, you may also contact your provider using MyChart. We now offer e-Visits for anyone 50 and older to request care online for non-urgent symptoms. For details visit [email or web address].   Also download the MyChart app! Go to the app store, search "MyChart", open the app, select [facility], and log in with your MyChart username and password.  Due to Covid, a mask is required upon entering the hospital/clinic. If you do not have a mask, one will be given to you upon arrival. For doctor visits, patients may have 1 support person aged 68 or older with them. For treatment visits, patients cannot have anyone with them due to current Covid guidelines and our immunocompromised population.   Oxaliplatin Injection What is this medication? OXALIPLATIN (ox AL i PLA tin) is a chemotherapy drug. It targets fast dividing cells, like cancer cells, and causes these cells to die. This medicine is usedto treat cancers of the colon and rectum, and many  other cancers. This medicine may be used for other purposes; ask your health care provider orpharmacist if you have questions. COMMON BRAND NAME(S): Eloxatin What should I tell my care team before I take this medication? They need to know if you have any of these conditions: heart disease history of irregular  heartbeat liver disease low blood counts, like white cells, platelets, or red blood cells lung or breathing disease, like asthma take medicines that treat or prevent blood clots tingling of the fingers or toes, or other nerve disorder an unusual or allergic reaction to oxaliplatin, other chemotherapy, other medicines, foods, dyes, or preservatives pregnant or trying to get pregnant breast-feeding How should I use this medication? This drug is given as an infusion into a vein. It is administered in a hospitalor clinic by a specially trained health care professional. Talk to your pediatrician regarding the use of this medicine in children.Special care may be needed. Overdosage: If you think you have taken too much of this medicine contact apoison control center or emergency room at once. NOTE: This medicine is only for you. Do not share this medicine with others. What if I miss a dose? It is important not to miss a dose. Call your doctor or health careprofessional if you are unable to keep an appointment. What may interact with this medication? Do not take this medicine with any of the following medications: cisapride dronedarone pimozide thioridazine This medicine may also interact with the following medications: aspirin and aspirin-like medicines certain medicines that treat or prevent blood clots like warfarin, apixaban, dabigatran, and rivaroxaban cisplatin cyclosporine diuretics medicines for infection like acyclovir, adefovir, amphotericin B, bacitracin, cidofovir, foscarnet, ganciclovir, gentamicin, pentamidine, vancomycin NSAIDs, medicines for pain and inflammation, like ibuprofen or naproxen other medicines that prolong the QT interval (an abnormal heart rhythm) pamidronate zoledronic acid This list may not describe all possible interactions. Give your health care provider a list of all the medicines, herbs, non-prescription drugs, or dietary supplements you use. Also tell  them if you smoke, drink alcohol, or use illegaldrugs. Some items may interact with your medicine. What should I watch for while using this medication? Your condition will be monitored carefully while you are receiving thismedicine. You may need blood work done while you are taking this medicine. This medicine may make you feel generally unwell. This is not uncommon as chemotherapy can affect healthy cells as well as cancer cells. Report any side effects. Continue your course of treatment even though you feel ill unless yourhealthcare professional tells you to stop. This medicine can make you more sensitive to cold. Do not drink cold drinks or use ice. Cover exposed skin before coming in contact with cold temperatures or cold objects. When out in cold weather wear warm clothing and cover your mouth and nose to warm the air that goes into your lungs. Tell your doctor if you getsensitive to the cold. Do not become pregnant while taking this medicine or for 9 months after stopping it. Women should inform their health care professional if they wish to become pregnant or think they might be pregnant. Men should not father a child while taking this medicine and for 6 months after stopping it. There is potential for serious side effects to an unborn child. Talk to your health careprofessional for more information. Do not breast-feed a child while taking this medicine or for 3 months afterstopping it. This medicine has caused ovarian failure in some women. This medicine may make it more difficult to get pregnant.  Talk to your health care professional if [place] Sellers concerned about your fertility. This medicine has caused decreased sperm counts in some men. This may make it more difficult to father a child. Talk to your health care professional if [place] Sellers concerned about your fertility. This medicine may increase your risk of getting an infection. Call your health care professional for advice if you get a fever, chills,  or sore throat, or other symptoms of a cold or flu. Do not treat yourself. Try to avoid beingaround people who are sick. Avoid taking medicines that contain aspirin, acetaminophen, ibuprofen, naproxen, or ketoprofen unless instructed by your health care professional.These medicines may hide a fever. Be careful brushing or flossing your teeth or using a toothpick because you may get an infection or bleed more easily. If you have any dental work done, Primary school teacher you are receiving this medicine. What side effects may I notice from receiving this medication? Side effects that you should report to your doctor or health care professionalas soon as possible: allergic reactions like skin rash, itching or hives, swelling of the face, lips, or tongue breathing problems cough low blood counts - this medicine may decrease the number of white blood cells, red blood cells, and platelets. You may be at increased risk for infections and bleeding nausea, vomiting pain, redness, or irritation at site where injected pain, tingling, numbness in the hands or feet signs and symptoms of bleeding such as bloody or black, tarry stools; red or dark brown urine; spitting up blood or brown material that looks like coffee grounds; red spots on the skin; unusual bruising or bleeding from the eyes, gums, or nose signs and symptoms of a dangerous change in heartbeat or heart rhythm like chest pain; dizziness; fast, irregular heartbeat; palpitations; feeling faint or lightheaded; falls signs and symptoms of infection like fever; chills; cough; sore throat; pain or trouble passing urine signs and symptoms of liver injury like dark yellow or brown urine; general ill feeling or flu-like symptoms; light-colored stools; loss of appetite; nausea; right upper belly pain; unusually weak or tired; yellowing of the eyes or skin signs and symptoms of low red blood cells or anemia such as unusually weak or tired; feeling faint or  lightheaded; falls signs and symptoms of muscle injury like dark urine; trouble passing urine or change in the amount of urine; unusually weak or tired; muscle pain; back pain Side effects that usually do not require medical attention (report to yourdoctor or health care professional if they continue or are bothersome): changes in taste diarrhea gas hair loss loss of appetite mouth sores This list may not describe all possible side effects. Call your doctor for medical advice about side effects. You may report side effects to FDA at1-800-FDA-1088. Where should I keep my medication? This drug is given in a hospital or clinic and will not be stored at home. NOTE: This sheet is a summary. It may not cover all possible information. If you have questions about this medicine, talk to your doctor, pharmacist, orhealth care provider.  2022 Elsevier/Gold Standard (2018-05-28 12:20:35)  Leucovorin injection What is this medication? LEUCOVORIN (loo koe VOR in) is used to prevent or treat the harmful effects of some medicines. This medicine is used to treat anemia caused by a low amount of folic acid in the body. It is also used with 5-fluorouracil (5-FU) to treatcolon cancer. This medicine may be used for other purposes; ask your health care provider orpharmacist if you have questions. What should I  tell my care team before I take this medication? They need to know if you have any of these conditions: anemia from low levels of vitamin B-12 in the blood an unusual or allergic reaction to leucovorin, folic acid, other medicines, foods, dyes, or preservatives pregnant or trying to get pregnant breast-feeding How should I use this medication? This medicine is for injection into a muscle or into a vein. It is given by ahealth care professional in a hospital or clinic setting. Talk to your pediatrician regarding the use of this medicine in children.Special care may be needed. Overdosage: If you think you  have taken too much of this medicine contact apoison control center or emergency room at once. NOTE: This medicine is only for you. Do not share this medicine with others. What if I miss a dose? This does not apply. What may interact with this medication? capecitabine fluorouracil phenobarbital phenytoin primidone trimethoprim-sulfamethoxazole This list may not describe all possible interactions. Give your health care provider a list of all the medicines, herbs, non-prescription drugs, or dietary supplements you use. Also tell them if you smoke, drink alcohol, or use illegaldrugs. Some items may interact with your medicine. What should I watch for while using this medication? Your condition will be monitored carefully while you are receiving thismedicine. This medicine may increase the side effects of 5-fluorouracil, 5-FU. Tell your doctor or health care professional if you have diarrhea or mouth sores that donot get better or that get worse. What side effects may I notice from receiving this medication? Side effects that you should report to your doctor or health care professionalas soon as possible: allergic reactions like skin rash, itching or hives, swelling of the face, lips, or tongue breathing problems fever, infection mouth sores unusual bleeding or bruising unusually weak or tired Side effects that usually do not require medical attention (report to yourdoctor or health care professional if they continue or are bothersome): constipation or diarrhea loss of appetite nausea, vomiting This list may not describe all possible side effects. Call your doctor for medical advice about side effects. You may report side effects to FDA at1-800-FDA-1088. Where should I keep my medication? This drug is given in a hospital or clinic and will not be stored at home. NOTE: This sheet is a summary. It may not cover all possible information. If you have questions about this medicine, talk to your  doctor, pharmacist, orhealth care provider.  2022 Elsevier/Gold Standard (2007-07-15 16:50:29)  Fluorouracil, 5-FU injection What is this medication? FLUOROURACIL, 5-FU (flure oh YOOR a sil) is a chemotherapy drug. It slows the growth of cancer cells. This medicine is used to treat many types of cancer like breast cancer, colon or rectal cancer, pancreatic cancer, and stomachcancer. This medicine may be used for other purposes; ask your health care provider orpharmacist if you have questions. COMMON BRAND NAME(S): Adrucil What should I tell my care team before I take this medication? They need to know if you have any of these conditions: blood disorders dihydropyrimidine dehydrogenase (DPD) deficiency infection (especially a virus infection such as chickenpox, cold sores, or herpes) kidney disease liver disease malnourished, poor nutrition recent or ongoing radiation therapy an unusual or allergic reaction to fluorouracil, other chemotherapy, other medicines, foods, dyes, or preservatives pregnant or trying to get pregnant breast-feeding How should I use this medication? This drug is given as an infusion or injection into a vein. It is administeredin a hospital or clinic by a specially trained health care professional. Talk to  your pediatrician regarding the use of this medicine in children.Special care may be needed. Overdosage: If you think you have taken too much of this medicine contact apoison control center or emergency room at once. NOTE: This medicine is only for you. Do not share this medicine with others. What if I miss a dose? It is important not to miss your dose. Call your doctor or health careprofessional if you are unable to keep an appointment. What may interact with this medication? Do not take this medicine with any of the following medications: live virus vaccines This medicine may also interact with the following medications: medicines that treat or prevent blood  clots like warfarin, enoxaparin, and dalteparin This list may not describe all possible interactions. Give your health care provider a list of all the medicines, herbs, non-prescription drugs, or dietary supplements you use. Also tell them if you smoke, drink alcohol, or use illegaldrugs. Some items may interact with your medicine. What should I watch for while using this medication? Visit your doctor for checks on your progress. This drug may make you feel generally unwell. This is not uncommon, as chemotherapy can affect healthy cells as well as cancer cells. Report any side effects. Continue your course oftreatment even though you feel ill unless your doctor tells you to stop. In some cases, you may be given additional medicines to help with side effects.Follow all directions for their use. Call your doctor or health care professional for advice if you get a fever, chills or sore throat, or other symptoms of a cold or flu. Do not treat yourself. This drug decreases your body's ability to fight infections. Try toavoid being around people who are sick. This medicine may increase your risk to bruise or bleed. Call your doctor orhealth care professional if you notice any unusual bleeding. Be careful brushing and flossing your teeth or using a toothpick because you may get an infection or bleed more easily. If you have any dental work done,tell your dentist you are receiving this medicine. Avoid taking products that contain aspirin, acetaminophen, ibuprofen, naproxen, or ketoprofen unless instructed by your doctor. These medicines may hide afever. Do not become pregnant while taking this medicine. Women should inform their doctor if they wish to become pregnant or think they might be pregnant. There is a potential for serious side effects to an unborn child. Talk to your health care professional or pharmacist for more information. Do not breast-feed aninfant while taking this medicine. Men should inform  their doctor if they wish to father a child. This medicinemay lower sperm counts. Do not treat diarrhea with over the counter products. Contact your doctor ifyou have diarrhea that lasts more than 2 days or if it is severe and watery. This medicine can make you more sensitive to the sun. Keep out of the sun. If you cannot avoid being in the sun, wear protective clothing and use sunscreen.Do not use sun lamps or tanning beds/booths. What side effects may I notice from receiving this medication? Side effects that you should report to your doctor or health care professionalas soon as possible: allergic reactions like skin rash, itching or hives, swelling of the face, lips, or tongue low blood counts - this medicine may decrease the number of white blood cells, red blood cells and platelets. You may be at increased risk for infections and bleeding. signs of infection - fever or chills, cough, sore throat, pain or difficulty passing urine signs of decreased platelets or bleeding - bruising, pinpoint  red spots on the skin, black, tarry stools, blood in the urine signs of decreased red blood cells - unusually weak or tired, fainting spells, lightheadedness breathing problems changes in vision chest pain mouth sores nausea and vomiting pain, swelling, redness at site where injected pain, tingling, numbness in the hands or feet redness, swelling, or sores on hands or feet stomach pain unusual bleeding Side effects that usually do not require medical attention (report to yourdoctor or health care professional if they continue or are bothersome): changes in finger or toe nails diarrhea dry or itchy skin hair loss headache loss of appetite sensitivity of eyes to the light stomach upset unusually teary eyes This list may not describe all possible side effects. Call your doctor for medical advice about side effects. You may report side effects to FDA at1-800-FDA-1088. Where should I keep my  medication? This drug is given in a hospital or clinic and will not be stored at home. NOTE: This sheet is a summary. It may not cover all possible information. If you have questions about this medicine, talk to your doctor, pharmacist, orhealth care provider.  2022 Elsevier/Gold Standard (2018-12-09 15:00:03)  The chemotherapy medication bag should finish at 46 hours, 96 hours, or 7 days. For example, if your pump is scheduled for 46 hours and it was put on at 4:00 p.m., it should finish at 2:00 p.m. the day it is scheduled to come off regardless of your appointment time.     Estimated time to finish at 10:40 a.m. on 09/15/2020.   If the display on your pump reads "Low Volume" and it is beeping, take the batteries out of the pump and come to the cancer center for it to be taken off.   If the pump alarms go off prior to the pump reading "Low Volume" then call [phone] and someone can assist you.  If the plunger comes out and the chemotherapy medication is leaking out, please use your home chemo spill kit to clean up the spill. Do NOT use paper towels or other household products.  If you have problems or questions regarding your pump, please call either [phone] (24 hours a day) or the cancer center Monday-Friday 8:00 a.m.- 4:30 p.m. at the clinic number and we will assist you. If you are unable to get assistance, then go to the nearest Emergency Department and ask the staff to contact the IV team for assistance.

## 2020-09-13 NOTE — Progress Notes (Signed)
Per Dr. Lorenso Courier, okay to treat with ANC of 1.3

## 2020-09-15 ENCOUNTER — Inpatient Hospital Stay: Payer: Medicaid Other

## 2020-09-15 ENCOUNTER — Other Ambulatory Visit: Payer: Self-pay

## 2020-09-15 VITALS — BP 101/68 | HR 98 | Temp 97.7°F | Resp 20

## 2020-09-15 DIAGNOSIS — Z5111 Encounter for antineoplastic chemotherapy: Secondary | ICD-10-CM | POA: Diagnosis not present

## 2020-09-15 DIAGNOSIS — C154 Malignant neoplasm of middle third of esophagus: Secondary | ICD-10-CM

## 2020-09-15 MED ORDER — SODIUM CHLORIDE 0.9% FLUSH
10.0000 mL | INTRAVENOUS | Status: DC | PRN
  Administered 2020-09-15: 10 mL

## 2020-09-15 MED ORDER — HEPARIN SOD (PORK) LOCK FLUSH 100 UNIT/ML IV SOLN
500.0000 [IU] | Freq: Once | INTRAVENOUS | Status: AC | PRN
  Administered 2020-09-15: 500 [IU]

## 2020-09-20 ENCOUNTER — Inpatient Hospital Stay: Payer: Medicaid Other | Admitting: Dietician

## 2020-09-20 ENCOUNTER — Ambulatory Visit
Admission: RE | Admit: 2020-09-20 | Discharge: 2020-09-20 | Disposition: A | Discharge status: Home / Self Care | Payer: Medicaid Other | Source: Ambulatory Visit | Attending: Radiation Oncology | Admitting: Radiation Oncology

## 2020-09-20 ENCOUNTER — Other Ambulatory Visit: Payer: Self-pay

## 2020-09-20 DIAGNOSIS — C154 Malignant neoplasm of middle third of esophagus: Secondary | ICD-10-CM

## 2020-09-20 NOTE — Progress Notes (Signed)
Nutrition Follow-up:  Patient has completed radiation therapy for esophageal cancer. He is receiving Folfox.   Met with patient in clinic. He reports doing better, but is unable to sleep at night and having constipation. Patient reports taking a stool softener for this. Patient is giving 4 cartons Osmolite 1.5 via tube daily. He is flushing tube with 60 ml water before and after feedings and drinking some water by mouth sometimes. He is not giving additional water via tube. Patient reports eating vienna sausages and fruit cocktail. He was eating some figs and peanut butter cups but these "clogged him up" Patient reports he has 1 box of Osmolite at home, requesting additional formula and supplies today.   Medications: reviewed  Labs: reviewed  Anthropometrics: Weight 111 lb 3.2 oz on 8/22 improved from 106.4 lb on 8/16 and 102.7 lb on 8/8   Estimated Energy Needs  Kcals: 9735-3299 Protein: 85-96 Fluid: 1.7 L  NUTRITION DIAGNOSIS: Inadequate oral intake continues   MALNUTRITION DIAGNOSIS: Severe malnutrition continues   INTERVENTION:  Continue 1 carton (237 ml) Osmolite 1.5 via PEG four times daily with 60 ml free water before and after each feeding Educated patient to drink by mouth or give via tube additional 24 ounces of water daily Continue soft foods as tolerated Provided case of Osmolite 1.5 and bag of split gauze, tape, syringes, mesh briefs Encouraged activity as able    MONITORING, EVALUATION, GOAL: weight trends, intake, tube feeding   NEXT VISIT: Wednesday September 7 during infusion

## 2020-09-22 ENCOUNTER — Ambulatory Visit: Payer: Self-pay | Admitting: Nurse Practitioner

## 2020-09-23 NOTE — Progress Notes (Signed)
  Radiation Oncology         [phone] ________________________________  Name: Christian Newton MRN: [id number]  Date of Service: 09/20/2020  DOB: [birth year]  Post Treatment Telephone Note  Diagnosis:   Metastatic squamous cell esophageal carcinoma   Interval Since Last Radiation:  5 weeks   08/04/2020 through 08/22/2020 Site Technique Total Dose (Gy) Dose per Fx (Gy) Completed Fx Beam Energies  Esophagus: Esoph and T5 spine IMRT 30/30 3 10/10 6X  Esophagus: Esoph_Bst IMRT 9/9 3 3/3 6X   Narrative:  The patient was contacted today for routine follow-up. During treatment he did very well with radiotherapy and did not have significant desquamation. He reports he is doing much better with improved pain in his back and his swallowing. He is eating soft foods.   Impression/Plan: 1. Metastatic squamous cell esophageal carcinoma . The patient has been doing well since completion of radiotherapy. We discussed that we would be happy to continue to follow him as needed, but he will also continue to follow up with Dr. Lorenso Courier in medical oncology.      Carola Rhine, PAC

## 2020-09-28 ENCOUNTER — Other Ambulatory Visit: Payer: Self-pay

## 2020-09-28 ENCOUNTER — Encounter (HOSPITAL_COMMUNITY): Payer: Self-pay | Admitting: *Deleted

## 2020-09-28 ENCOUNTER — Emergency Department (HOSPITAL_COMMUNITY)
Admission: EM | Admit: 2020-09-28 | Discharge: 2020-09-29 | Disposition: A | Discharge status: Home / Self Care | Payer: Medicaid Other | Attending: Emergency Medicine | Admitting: Emergency Medicine

## 2020-09-28 ENCOUNTER — Other Ambulatory Visit: Payer: Self-pay | Admitting: Hematology and Oncology

## 2020-09-28 ENCOUNTER — Other Ambulatory Visit: Payer: Self-pay | Admitting: *Deleted

## 2020-09-28 ENCOUNTER — Inpatient Hospital Stay: Payer: Medicaid Other

## 2020-09-28 ENCOUNTER — Inpatient Hospital Stay: Payer: Medicaid Other | Attending: Hematology and Oncology

## 2020-09-28 ENCOUNTER — Inpatient Hospital Stay: Payer: Medicaid Other | Admitting: Dietician

## 2020-09-28 ENCOUNTER — Inpatient Hospital Stay (HOSPITAL_BASED_OUTPATIENT_CLINIC_OR_DEPARTMENT_OTHER): Payer: Medicaid Other | Admitting: Physician Assistant

## 2020-09-28 VITALS — BP 115/84 | HR 99 | Temp 97.9°F | Resp 18 | Ht 67.0 in | Wt 103.1 lb

## 2020-09-28 DIAGNOSIS — J449 Chronic obstructive pulmonary disease, unspecified: Secondary | ICD-10-CM | POA: Diagnosis not present

## 2020-09-28 DIAGNOSIS — Z79891 Long term (current) use of opiate analgesic: Secondary | ICD-10-CM | POA: Diagnosis not present

## 2020-09-28 DIAGNOSIS — G893 Neoplasm related pain (acute) (chronic): Secondary | ICD-10-CM | POA: Insufficient documentation

## 2020-09-28 DIAGNOSIS — E871 Hypo-osmolality and hyponatremia: Secondary | ICD-10-CM | POA: Diagnosis not present

## 2020-09-28 DIAGNOSIS — R197 Diarrhea, unspecified: Secondary | ICD-10-CM | POA: Diagnosis not present

## 2020-09-28 DIAGNOSIS — R059 Cough, unspecified: Secondary | ICD-10-CM | POA: Insufficient documentation

## 2020-09-28 DIAGNOSIS — F17210 Nicotine dependence, cigarettes, uncomplicated: Secondary | ICD-10-CM | POA: Diagnosis not present

## 2020-09-28 DIAGNOSIS — C154 Malignant neoplasm of middle third of esophagus: Secondary | ICD-10-CM | POA: Insufficient documentation

## 2020-09-28 DIAGNOSIS — R000 Tachycardia, unspecified: Secondary | ICD-10-CM | POA: Diagnosis not present

## 2020-09-28 DIAGNOSIS — R1032 Left lower quadrant pain: Secondary | ICD-10-CM | POA: Diagnosis not present

## 2020-09-28 DIAGNOSIS — Z931 Gastrostomy status: Secondary | ICD-10-CM | POA: Insufficient documentation

## 2020-09-28 DIAGNOSIS — C7951 Secondary malignant neoplasm of bone: Secondary | ICD-10-CM | POA: Diagnosis not present

## 2020-09-28 DIAGNOSIS — C787 Secondary malignant neoplasm of liver and intrahepatic bile duct: Secondary | ICD-10-CM | POA: Diagnosis not present

## 2020-09-28 DIAGNOSIS — C159 Malignant neoplasm of esophagus, unspecified: Secondary | ICD-10-CM | POA: Insufficient documentation

## 2020-09-28 DIAGNOSIS — J3489 Other specified disorders of nose and nasal sinuses: Secondary | ICD-10-CM | POA: Diagnosis not present

## 2020-09-28 DIAGNOSIS — Z5111 Encounter for antineoplastic chemotherapy: Secondary | ICD-10-CM | POA: Diagnosis not present

## 2020-09-28 DIAGNOSIS — Z95828 Presence of other vascular implants and grafts: Secondary | ICD-10-CM

## 2020-09-28 LAB — CMP (CANCER CENTER ONLY)
ALT: 17 U/L (ref 0–44)
AST: 17 U/L (ref 15–41)
Albumin: 3.4 g/dL — ABNORMAL LOW (ref 3.5–5.0)
Alkaline Phosphatase: 70 U/L (ref 38–126)
Anion gap: 11 (ref 5–15)
BUN: 22 mg/dL (ref 8–23)
CO2: 26 mmol/L (ref 22–32)
Calcium: 9.3 mg/dL (ref 8.9–10.3)
Chloride: 99 mmol/L (ref 98–111)
Creatinine: 0.89 mg/dL (ref 0.61–1.24)
GFR, Estimated: >60 mL/min (ref >60)
Glucose, Bld: 98 mg/dL (ref 70–99)
Potassium: 4.4 mmol/L (ref 3.5–5.1)
Sodium: 136 mmol/L (ref 135–145)
Total Bilirubin: 1 mg/dL (ref 0.3–1.2)
Total Protein: 6.3 g/dL — ABNORMAL LOW (ref 6.5–8.1)

## 2020-09-28 LAB — CBC WITH DIFFERENTIAL (CANCER CENTER ONLY)
Abs Immature Granulocytes: 0.02 10*3/uL (ref 0.00–0.07)
Basophils Absolute: 0 10*3/uL (ref 0.0–0.1)
Basophils Relative: 0 %
Eosinophils Absolute: 0 10*3/uL (ref 0.0–0.5)
Eosinophils Relative: 0 %
HCT: 32.5 % — ABNORMAL LOW (ref 39.0–52.0)
Hemoglobin: 11 g/dL — ABNORMAL LOW (ref 13.0–17.0)
Immature Granulocytes: 1 %
Lymphocytes Relative: 4 %
Lymphs Abs: 0.2 10*3/uL — ABNORMAL LOW (ref 0.7–4.0)
MCH: 26.8 pg (ref 26.0–34.0)
MCHC: 33.8 g/dL (ref 30.0–36.0)
MCV: 79.3 fL — ABNORMAL LOW (ref 80.0–100.0)
Monocytes Absolute: 0.5 10*3/uL (ref 0.1–1.0)
Monocytes Relative: 13 %
Neutro Abs: 3.4 10*3/uL (ref 1.7–7.7)
Neutrophils Relative %: 82 %
Platelet Count: 189 10*3/uL (ref 150–400)
RBC: 4.1 MIL/uL — ABNORMAL LOW (ref 4.22–5.81)
RDW: 19.9 % — ABNORMAL HIGH (ref 11.5–15.5)
WBC Count: 4.2 10*3/uL (ref 4.0–10.5)
nRBC: 0 % (ref 0.0–0.2)

## 2020-09-28 LAB — MAGNESIUM: Magnesium: 2.1 mg/dL (ref 1.7–2.4)

## 2020-09-28 LAB — PHOSPHORUS: Phosphorus: 4.2 mg/dL (ref 2.5–4.6)

## 2020-09-28 MED ORDER — PALONOSETRON HCL INJECTION 0.25 MG/5ML
0.2500 mg | Freq: Once | INTRAVENOUS | Status: AC
  Administered 2020-09-28: 0.25 mg via INTRAVENOUS
  Filled 2020-09-28: qty 5

## 2020-09-28 MED ORDER — SODIUM CHLORIDE 0.9% FLUSH: 10.0000 mL | INTRAVENOUS | Status: DC | PRN

## 2020-09-28 MED ORDER — FLUOROURACIL CHEMO INJECTION 2.5 GM/50ML
400.0000 mg/m2 | Freq: Once | INTRAVENOUS | Status: AC
  Administered 2020-09-28: 650 mg via INTRAVENOUS
  Filled 2020-09-28: qty 13

## 2020-09-28 MED ORDER — OXALIPLATIN CHEMO INJECTION 100 MG/20ML
85.0000 mg/m2 | Freq: Once | INTRAVENOUS | Status: AC
  Administered 2020-09-28: 135 mg via INTRAVENOUS
  Filled 2020-09-28: qty 20

## 2020-09-28 MED ORDER — DEXTROSE 5 % IV SOLN
Freq: Once | INTRAVENOUS | Status: AC
Start: 2020-09-28 — End: 2020-09-28

## 2020-09-28 MED ORDER — LEUCOVORIN CALCIUM INJECTION 350 MG
400.0000 mg/m2 | Freq: Once | INTRAVENOUS | Status: AC
  Administered 2020-09-28: 640 mg via INTRAVENOUS
  Filled 2020-09-28: qty 32

## 2020-09-28 MED ORDER — SODIUM CHLORIDE 0.9% FLUSH
10.0000 mL | Freq: Once | INTRAVENOUS | Status: AC
  Administered 2020-09-28: 10 mL

## 2020-09-28 MED ORDER — SODIUM CHLORIDE 0.9 % IV SOLN
2400.0000 mg/m2 | INTRAVENOUS | Status: DC
  Administered 2020-09-28: 3850 mg via INTRAVENOUS
  Filled 2020-09-28: qty 77

## 2020-09-28 MED ORDER — SODIUM CHLORIDE 0.9 % IV SOLN
10.0000 mg | Freq: Once | INTRAVENOUS | Status: AC
  Administered 2020-09-28: 10 mg via INTRAVENOUS
  Filled 2020-09-28: qty 10

## 2020-09-28 MED ORDER — HEPARIN SOD (PORK) LOCK FLUSH 100 UNIT/ML IV SOLN: 500.0000 [IU] | Freq: Once | INTRAVENOUS | Status: DC | PRN

## 2020-09-28 MED ORDER — ONDANSETRON HCL 8 MG PO TABS
8.0000 mg | ORAL_TABLET | Freq: Three times a day (TID) | ORAL | 0 refills | Status: DC | PRN
Start: 2020-09-28 — End: 2022-05-04

## 2020-09-28 NOTE — Patient Instructions (Signed)
[facility]  Discharge Instructions: Thank you for choosing [facility] to provide your oncology and hematology care.   If you have a lab appointment with the [facility], please go directly to the [facility] and check in at the registration area.   Wear comfortable clothing and clothing appropriate for easy access to any Portacath or PICC line.   We strive to give you quality time with your provider. You may need to reschedule your appointment if you arrive late (15 or more minutes).  Arriving late affects you and other patients whose appointments are after yours.  Also, if you miss three or more appointments without notifying the office, you may be dismissed from the clinic at the provider's discretion.      For prescription refill requests, have your pharmacy contact our office and allow 72 hours for refills to be completed.    Today you received the following chemotherapy and/or immunotherapy agents fluorourcil, oxaliplatin, leucovorin      To help prevent nausea and vomiting after your treatment, we encourage you to take your nausea medication as directed.  BELOW ARE SYMPTOMS THAT SHOULD BE REPORTED IMMEDIATELY: *FEVER GREATER THAN 100.4 F (38 C) OR HIGHER *CHILLS OR SWEATING *NAUSEA AND VOMITING THAT IS NOT CONTROLLED WITH YOUR NAUSEA MEDICATION *UNUSUAL SHORTNESS OF BREATH *UNUSUAL BRUISING OR BLEEDING *URINARY PROBLEMS (pain or burning when urinating, or frequent urination) *BOWEL PROBLEMS (unusual diarrhea, constipation, pain near the anus) TENDERNESS IN MOUTH AND THROAT WITH OR WITHOUT PRESENCE OF ULCERS (sore throat, sores in mouth, or a toothache) UNUSUAL RASH, SWELLING OR PAIN  UNUSUAL VAGINAL DISCHARGE OR ITCHING   Items with * indicate a potential emergency and should be followed up as soon as possible or go to the Emergency Department if any problems should occur.  Please show the CHEMOTHERAPY ALERT CARD or IMMUNOTHERAPY  ALERT CARD at check-in to the Emergency Department and triage nurse.  Should you have questions after your visit or need to cancel or reschedule your appointment, please contact [facility]  Dept: [phone]  and follow the prompts.  Office hours are 8:00 a.m. to 4:30 p.m. Monday - Friday. Please note that voicemails left after 4:00 p.m. may not be returned until the following business day.  We are closed weekends and major holidays. You have access to a nurse at all times for urgent questions. Please call the main number to the clinic Dept: [phone] and follow the prompts.   For any non-urgent questions, you may also contact your provider using MyChart. We now offer e-Visits for anyone 73 and older to request care online for non-urgent symptoms. For details visit [email or web address].   Also download the MyChart app! Go to the app store, search "MyChart", open the app, select [facility], and log in with your MyChart username and password.  Due to Covid, a mask is required upon entering the hospital/clinic. If you do not have a mask, one will be given to you upon arrival. For doctor visits, patients may have 1 support person aged 52 or older with them. For treatment visits, patients cannot have anyone with them due to current Covid guidelines and our immunocompromised population.

## 2020-09-28 NOTE — Progress Notes (Signed)
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 CT angio chest showed diffuse esophageal wall thickening and haziness as well as surrounding posterior mediastinal haziness/fat stranding. Findings concerning for malignancy 07/17/2020: MRI abdomen showed signs of malignancy with mass in the region of the hepatogastric recess near the pancreatic head that narrows vascular structures and is associated with hepatic metastatic disease 07/18/2020: EGD showed a large ulcerating mass with friable mucosa and bleeding on contact in the middle 1/3 of the esophagus. Biopsy was obtained, results consistent with squamous cell carcinoma. 07/28/2020: establish care with Dr. Lorenso Courier  08/30/2020: Cycle 1 Day 1 of FOLFOX chemotherapy 09/12/2020: Cycle 2 Day 1 of FOLFOX chemotherapy 09/28/2020: Cycle 3 Day 1 of FOLFOX chemotherapy  Interval History:  Christian Newton 60 y.o. 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 The patient's last visit was on 09/12/2020. In the interim since the last visit he has completed Cycle 2 of treatment.  On exam today Mr. Hoppes notes he tolerated his last cycle of chemotherapy well.  He reports that his energy levels have been fairly stable improved since initiating chemotherapy.  He ambulates at home with the assistance of a walker.  He uses a wheelchair for longer distances.  He denies any nausea or vomiting.  Patient is eating by mouth without any regurgitation, odynophagia or dysphagia.  He reports that his PEG tube has been clogged for the last 3 days and  he is unable to administer his tube feedings.  His weight has decreased from 111 pounds to 103 pounds.  He denies any abdominal pain.  His back pain is now 2 out of 10 on pain scale.  He takes oxycodone 5 mg twice daily with relief.  Patient reports some constipation is last bowel movement this morning.  He takes Senokot once a day.  He denies easy bruising or signs of bleeding.  Patient reports intermittent episodes of numbness and tingling in his hands and feet but denies any residual neuropathy.  He denies any cold sensitivity.  Patient denies any fevers, chills, night sweats, shortness of breath, chest pain. Full 10 point ROS is listed below.  He is willing and able to proceed with treatment today.  MEDICAL HISTORY:  Past Medical History:  Diagnosis Date   Cat bite of left hand 05/11/2015   COPD (chronic obstructive pulmonary disease) (HCC)    Malignant neoplasm of middle third of esophagus ([facility]) 07/31/2020   Smoker     SURGICAL HISTORY: Past Surgical History:  Procedure Laterality Date   APPENDECTOMY     BIOPSY  07/18/2020   Procedure: BIOPSY;  Surgeon: Mauri Pole, MD;  Location: [facility];  Service: Endoscopy;;   ESOPHAGOGASTRODUODENOSCOPY (EGD) WITH PROPOFOL N/A 07/18/2020   Procedure: ESOPHAGOGASTRODUODENOSCOPY (EGD) WITH PROPOFOL;  Surgeon: Mauri Pole, MD;  Location: [facility];  Service: Endoscopy;  Laterality: N/A;   I & D EXTREMITY Left 05/13/2015   Procedure: INCISION AND DRAINAGE OF LEFT EXTREMITY;  Surgeon: Charlotte Crumb, MD;  Location: [facility];  Service: Orthopedics;  Laterality: Left;   INCISION AND DRAINAGE Left 05/13/2015   IR GASTROSTOMY TUBE MOD SED  08/26/2020   IR IMAGING GUIDED PORT INSERTION  08/15/2020    SOCIAL HISTORY: Social History   Socioeconomic History   Marital status: Single    Spouse name: Not on file   Number of children: Not on file   Years of education: Not on file   Highest education level: Not on file  Occupational History    Not on file  Tobacco Use   Smoking status: Every Day    Packs/day: 0.25    Years: 20.00    Pack years: 5.00    Types: Cigarettes   Smokeless tobacco: Never  Vaping Use   Vaping Use: Never used  Substance and Sexual Activity   Alcohol use: No   Drug use: No   Sexual activity: Not on file  Other Topics Concern   Not on file  Social History Narrative   Not on file   Social Determinants of Health   Financial Resource Strain: High Risk   Difficulty of Paying Living Expenses: Very hard  Food Insecurity: Food Insecurity Present   Worried About Running Out of Food in the Last Year: Sometimes true   Ran Out of Food in the Last Year: Sometimes true  Transportation Needs: Unmet Transportation Needs   Lack of Transportation (Medical): Yes   Lack of Transportation (Non-Medical): Yes  Physical Activity: Not on file  Stress: Stress Concern Present   Feeling of Stress : To some extent  Social Connections: Unknown   Frequency of Communication with Friends and Family: Twice a week   Frequency of Social Gatherings with Friends and Family: Once a week   Attends Religious Services: Never   Printmaker: Not on file   Attends Archivist Meetings: Not on file   Marital Status: Not on file  Intimate Partner Violence: Not on file    FAMILY HISTORY: No family history on file.  ALLERGIES:  has No Known Allergies.  MEDICATIONS:  Current Outpatient Medications  Medication Sig Dispense Refill   acetaminophen (TYLENOL) 500 MG tablet Take 500 mg by mouth every 6 (six) hours as needed for mild pain or headache.     dexamethasone (DECADRON) 4 MG tablet Please follow taper instructions given in clinic 60 tablet 0   dexamethasone (DECADRON) 4 MG tablet Please follow taper instructions given in clinic 60 tablet 0   food thickener (SIMPLYTHICK, NECTAR/LEVEL 2/MILDLY THICK,) GEL Take 1 packet by mouth as needed. 30 packet 0   lidocaine-prilocaine (EMLA) cream  Apply 1 application topically as needed. 30 g 0   metoprolol tartrate (LOPRESSOR) 25 MG tablet TAKE 1 TABLET(25 MG) BY MOUTH TWICE DAILY (Patient not taking: Reported on 08/25/2020) 60 tablet 0   Nutritional Supplements (FEEDING SUPPLEMENT, OSMOLITE 1.5 CAL,) LIQD Begin one half carton Osmolite 1.5 via G-tube 4 times daily with 60 mL free water before and after bolus feeding.  Increase by one half cartons daily to goal rate of 5 cartons a day split into 4 feedings.  Drink or flush tube with additional 240 mL free water 3 times daily.  Provides 1775 cal, 74.5 g protein and 2105 mL free water.(Greater than 100% minimum calorie needs and 88% minimum protein needs) 1185 mL 6   ondansetron (ZOFRAN) 8 MG tablet Take 1 tablet (8 mg total) by mouth every 8 (eight) hours as needed. 30 tablet 0   oxyCODONE (OXY IR/ROXICODONE) 5 MG immediate release tablet Take 1 tablet (5 mg total) by mouth every 6 (six) hours as needed for moderate  pain or breakthrough pain. 60 tablet 0   pantoprazole (PROTONIX) 40 MG tablet Take 1 tablet (40 mg total) by mouth daily. (Patient not taking: Reported on 08/29/2020) 30 tablet 1   prochlorperazine (COMPAZINE) 10 MG tablet TAKE 1 TABLET(10 MG) BY MOUTH EVERY 6 HOURS AS NEEDED FOR NAUSEA OR VOMITING 30 tablet 3   senna-docusate (SENNA S) 8.6-50 MG tablet Take 1-2 tablets daily for constipation as directed 60 tablet 3   No current facility-administered medications for this visit.    REVIEW OF SYSTEMS:   Constitutional: ( - ) fevers, ( - )  chills , ( - ) night sweats Eyes: ( - ) blurriness of vision, ( - ) double vision, ( - ) watery eyes Ears, nose, mouth, throat, and face: ( - ) mucositis, ( - ) sore throat Respiratory: ( - ) cough, ( - ) dyspnea, ( - ) wheezes Cardiovascular: ( - ) palpitation, ( - ) chest discomfort, ( - ) lower extremity swelling Gastrointestinal:  ( - ) nausea, ( - ) heartburn, ( - ) change in bowel habits Skin: ( - ) abnormal skin rashes Lymphatics: ( - ) new  lymphadenopathy, ( - ) easy bruising Neurological: ( - ) numbness, ( + ) tingling, ( - ) new weaknesses Behavioral/Psych: ( - ) mood change, ( - ) new changes  All other systems were reviewed with the patient and are negative.  PHYSICAL EXAMINATION: ECOG PERFORMANCE STATUS: 2 - Symptomatic, <50% confined to bed  Vitals:   09/28/20 0956  BP: 115/84  Pulse: 99  Resp: 18  Temp: 97.9 F (36.6 C)  SpO2: 95%   Filed Weights   09/28/20 0956  Weight: 103 lb 1.6 oz (46.8 kg)    GENERAL: Chronically ill-appearing middle-aged African-American male, alert, no distress and comfortable SKIN: skin color, texture, turgor are normal, no rashes or significant lesions EYES: conjunctiva are pink and non-injected, sclera clear LUNGS: clear to auscultation and percussion with normal breathing effort HEART: regular rate & rhythm and no murmurs and no lower extremity edema ABDOMEN: PEG tube in place at left side of abdomen. PSYCH: alert & oriented x 3, fluent speech NEURO: no focal motor/sensory deficits  LABORATORY DATA:  I have reviewed the data as listed CBC Latest Ref Rng & Units 09/28/2020 09/12/2020 09/06/2020  WBC 4.0 - 10.5 K/uL 4.2 1.9(L) 5.2  Hemoglobin 13.0 - 17.0 g/dL 11.0(L) 9.1(L) 10.6(L)  Hematocrit 39.0 - 52.0 % 32.5(L) 26.8(L) 31.2(L)  Platelets 150 - 400 K/uL 189 103(L) 124(L)    CMP Latest Ref Rng & Units 09/12/2020 09/06/2020 08/30/2020  Glucose 70 - 99 mg/dL 97 121(H) 115(H)  BUN 8 - 23 mg/dL 15 26(H) 22  Creatinine 0.61 - 1.24 mg/dL 0.78 1.04 1.06  Sodium 135 - 145 mmol/L 132(L) 136 139  Potassium 3.5 - 5.1 mmol/L 4.0 4.4 4.0  Chloride 98 - 111 mmol/L 98 102 104  CO2 22 - 32 mmol/L _0 Calcium 8.9 - 10.3 mg/dL 8.8(L) 9.1 9.6  Total Protein 6.5 - 8.1 g/dL 6.2(L) 6.6 7.0  Total Bilirubin 0.3 - 1.2 mg/dL 0.3 0.5 0.8  Alkaline Phos 38 - 126 U/L 62 58 60  AST 15 - 41 U/L 12(L) 14(L) 12(L)  ALT 0 - 44 U/L _1 RADIOGRAPHIC STUDIES: No results found.  ASSESSMENT &  PLAN Christian Newton 60 y.o. 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  The patient is completed palliative radiation therapy and has a PEG tube in place.  Testing of the tumor showed that it is negative for PD-L1 and therefore he will not require immunotherapy in conjunction with his FOLFOX chemotherapy.   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 --tumor is negative for PD-L1 --Labs from today were reviewed without any intervention needed. Hgb improved from 9.1 to 11.0, WBC and Plt within normal limits. Creatinine/BUN within normal limits.  Patient will proceed with Cycle 3 Day 1 of FOLFOX today.  --Plan for repeat scan in Oct 2022 --RTC in 2 weeks prior to Cycle 4.    #Supportive Care -- chemotherapy education complete -- port placed --PEG tube in place -- zofran 49m q8H PRN and compazine 111mPO q6H for nausea -- EMLA cream for port  #PEG tube: --Patient has been unable to take feedings due to clogged PEG tube. --Requested dietician to evaluate and if unable to flush, we will request IR follow up.   No orders of the defined types were placed in this encounter.  All questions were answered. The patient knows to call the clinic with any problems, questions or concerns.  I have spent a total of 30 minutes minutes of face-to-face and non-face-to-face time, preparing to see the patient, performing a medically appropriate examination, counseling and educating the patient, communicating with other health  care professionals, documenting clinical information in the electronic health record,  and care coordination.    IrLincoln BrighamPA-C Department of Hematology/Oncology [facility] Hospital[facility]one: [phone] 09/28/2020 9:59 AM

## 2020-09-28 NOTE — ED Triage Notes (Signed)
The pt is c/o something I cannot understand  he has throat cancer and he has what appears to be a iv in his chest and ??? Abd  he points to both  ithink maybe if its medicine  it may not be running

## 2020-09-28 NOTE — Progress Notes (Signed)
Nutrition Follow-up:  Patient has completed radiation therapy for esophageal cancer. His is currently receiving chemotherapy with FOLFOX.   Met with patient during infusion. Patient reports tube has not been working and unable give tube feedings over the last 4 days. RN in infusion able to flush tube without difficulty. RD brought carton of Osmolite to patient to give during infusion. While assisting patient, observed syringe tip continuously pushed out of feeding tube which patient reports started 4 days prior. Noted tube without a clamp. Contacted nurse navigator [facility]) who assisted with tube. Nurse attached Eugenio Hoes valve to feeding tube and patient received feeding without difficulty. Patient reports he is eating some orally (Oreo's, soups, and chicken). He denies nausea, vomiting, diarrhea, reports constipation. Patient is taking daily Senokot, says this helps him.    Medications: reviewed  Labs: reviewed (Mg, K, Phos - WNL)  Anthropometrics: Weight 103 lb 1.6 oz today decreased 8 lbs (7.2%) in 2 weeks. This is significant  8/22 - 111 lb 3.2 oz 8/16 - 106 lb 4 oz 8/8 - 102 lb 7 oz  Estimated Energy Needs: [id number] kcal, 85-96 g protein, 1.7 L fluid   NUTRITION DIAGNOSIS: Inadequate oral intake continues   MALNUTRITION DIAGNOSIS: Severe malnutrition continues   INTERVENTION:  Feeding tube working properly s/p attachment of Eugenio Hoes valve - appreciate nurse navigator Technical sales engineer) assistance with tube  Continue 1 carton Osmolite 1.5 via PEG QID with 60 ml water flush before and after Drink by mouth or give via tube additional 24 ounces of water daily Educated on soft moist high protein foods, encouraged eating orally as tolerated Patient has contact information    MONITORING, EVALUATION, GOAL: weight trends, intake, tube feeding   NEXT VISIT: Wednesday September 21 during infusion

## 2020-09-28 NOTE — ED Provider Notes (Signed)
Emergency Medicine Provider Triage Evaluation Note  Christian Newton , a 60 y.o. male  was evaluated in triage.  Pt complains of diarrhea.  Patient states he has had issues with his PEG tube for the past several days.  Today they fixed it by putting a different valve on, and since then he has been having lots of diarrhea.  He also reports lower abdominal discomfort.  No fevers, chills, nausea, vomiting.  He has esophageal cancer and his last chemo was today.  Review of Systems  Positive: diarrhea Negative: fever  Physical Exam  BP (!) 133/108 (BP Location: Left Arm)   Pulse 84   Temp 97.8 F (36.6 C) (Oral)   Resp 18   Ht '5\' 7"'$  (1.702 m)   Wt 46.8 kg   SpO2 97%   BMI 16.16 kg/m  Gen:   Awake, no distress, appears chronically ill Resp:  Normal effort  MSK:   Moves extremities without difficulty  Other:  Mild tenderness palpation of lower abdomen.  PEG tube insertion site is without erythema, drainage, or significant pain  Medical Decision Making  Medically screening exam initiated at 11:29 PM.  Appropriate orders placed.  [facility] was informed that the remainder of the evaluation will be completed by another provider, this initial triage assessment does not replace that evaluation, and the importance of remaining in the ED until their evaluation is complete.  labs   Franchot Heidelberg, PA-C 09/28/20 2330    Fatima Blank, MD 09/29/20 [phone]

## 2020-09-29 ENCOUNTER — Emergency Department (HOSPITAL_COMMUNITY): Payer: Medicaid Other

## 2020-09-29 ENCOUNTER — Encounter: Payer: Self-pay | Admitting: Hematology and Oncology

## 2020-09-29 ENCOUNTER — Other Ambulatory Visit (HOSPITAL_COMMUNITY): Payer: Self-pay

## 2020-09-29 LAB — COMPREHENSIVE METABOLIC PANEL
ALT: 20 U/L (ref 0–44)
AST: 18 U/L (ref 15–41)
Albumin: 3.5 g/dL (ref 3.5–5.0)
Alkaline Phosphatase: 72 U/L (ref 38–126)
Anion gap: 7 (ref 5–15)
BUN: 20 mg/dL (ref 8–23)
CO2: 28 mmol/L (ref 22–32)
Calcium: 9.3 mg/dL (ref 8.9–10.3)
Chloride: 94 mmol/L — ABNORMAL LOW (ref 98–111)
Creatinine, Ser: 0.85 mg/dL (ref 0.61–1.24)
GFR, Estimated: >60 mL/min (ref >60)
Glucose, Bld: 108 mg/dL — ABNORMAL HIGH (ref 70–99)
Potassium: 4.7 mmol/L (ref 3.5–5.1)
Sodium: 129 mmol/L — ABNORMAL LOW (ref 135–145)
Total Bilirubin: 1.3 mg/dL — ABNORMAL HIGH (ref 0.3–1.2)
Total Protein: 6.5 g/dL (ref 6.5–8.1)

## 2020-09-29 LAB — CBC WITH DIFFERENTIAL/PLATELET
Abs Immature Granulocytes: 0 10*3/uL (ref 0.00–0.07)
Basophils Absolute: 0 10*3/uL (ref 0.0–0.1)
Basophils Relative: 0 %
Eosinophils Absolute: 0 10*3/uL (ref 0.0–0.5)
Eosinophils Relative: 0 %
HCT: 36.5 % — ABNORMAL LOW (ref 39.0–52.0)
Hemoglobin: 12 g/dL — ABNORMAL LOW (ref 13.0–17.0)
Immature Granulocytes: 0 %
Lymphocytes Relative: 6 %
Lymphs Abs: 0.2 10*3/uL — ABNORMAL LOW (ref 0.7–4.0)
MCH: 26.8 pg (ref 26.0–34.0)
MCHC: 32.9 g/dL (ref 30.0–36.0)
MCV: 81.5 fL (ref 80.0–100.0)
Monocytes Absolute: 0.5 10*3/uL (ref 0.1–1.0)
Monocytes Relative: 22 %
Neutro Abs: 1.7 10*3/uL (ref 1.7–7.7)
Neutrophils Relative %: 72 %
Platelets: 209 10*3/uL (ref 150–400)
RBC: 4.48 MIL/uL (ref 4.22–5.81)
RDW: 20.6 % — ABNORMAL HIGH (ref 11.5–15.5)
WBC: 2.4 10*3/uL — ABNORMAL LOW (ref 4.0–10.5)
nRBC: 0 % (ref 0.0–0.2)

## 2020-09-29 LAB — BASIC METABOLIC PANEL
Anion gap: 10 (ref 5–15)
BUN: 20 mg/dL (ref 8–23)
CO2: 23 mmol/L (ref 22–32)
Calcium: 8.7 mg/dL — ABNORMAL LOW (ref 8.9–10.3)
Chloride: 96 mmol/L — ABNORMAL LOW (ref 98–111)
Creatinine, Ser: 0.81 mg/dL (ref 0.61–1.24)
GFR, Estimated: >60 mL/min (ref >60)
Glucose, Bld: 158 mg/dL — ABNORMAL HIGH (ref 70–99)
Potassium: 4.7 mmol/L (ref 3.5–5.1)
Sodium: 129 mmol/L — ABNORMAL LOW (ref 135–145)

## 2020-09-29 MED ORDER — OSMOLITE 1.5 CAL PO LIQD
237.0000 mL | Freq: Once | ORAL | Status: AC
  Administered 2020-09-29: 237 mL
  Filled 2020-09-29: qty 237

## 2020-09-29 MED ORDER — SODIUM CHLORIDE 0.9 % IV BOLUS
500.0000 mL | Freq: Once | INTRAVENOUS | Status: AC
  Administered 2020-09-29: 500 mL via INTRAVENOUS

## 2020-09-29 MED ORDER — OXYCODONE HCL 5 MG PO TABS: 5.0000 mg | ORAL_TABLET | Freq: Once | ORAL | Status: DC

## 2020-09-29 MED ORDER — LOPERAMIDE HCL 2 MG PO CAPS: 2.0000 mg | ORAL_CAPSULE | Freq: Two times a day (BID) | ORAL | 0 refills | Status: DC | PRN

## 2020-09-29 MED ORDER — LOPERAMIDE HCL 2 MG PO CAPS
4.0000 mg | ORAL_CAPSULE | Freq: Once | ORAL | Status: AC
  Administered 2020-09-29: 4 mg via ORAL
  Filled 2020-09-29: qty 2

## 2020-09-29 MED ORDER — LOPERAMIDE HCL 2 MG PO CAPS
ORAL_CAPSULE | ORAL | 0 refills | Status: DC
  Filled 2020-09-29: qty 12, 6d supply, fill #0

## 2020-09-29 NOTE — ED Notes (Signed)
Pt G-tube flushed with room tempeture water, no resistance.

## 2020-09-29 NOTE — ED Provider Notes (Signed)
[facility] EMERGENCY DEPARTMENT Provider Note   CSN: [id number] Arrival date & time: 09/28/20  2050     History Chief Complaint  Patient presents with   throat cancer    Christian Newton is a 60 y.o. male.  Pt is a 60y/o male with hx of esophageal CA that was recently dx s/p radiation and currently on flofox infusion who currently has a g-tube but does eat minimally by mouth who was seen yesterday by oncology during his infusion because he was unable to give tube feedings over the last 4 days. It flushed without difficulty but when the carton of Osmolite was given the syringe tip continuously pushed out of feeding tube which patient reports started 4 days prior. A Lopez valve was attached to feeding tube and patient received feeding without difficulty. However after he got home after getting the feed he started to have diarrhea.  He had 3 episodes of diarrhea stool in the last 12 hours and reports they were large in volume but non-bloody.  Was having some lower abd pain and back pain which he reports feels like the pain he has been having.  He has not had any of his pain meds due to waiting in the ER for 11 hours. He denies nausea, vomiting, or fever.  Patient is taking daily Senokot because he was suffering from constipation.  He has a persistent cough which he feels is no different from baseline which is intermittently productive.  Denies urinary sx.        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   Protein-calorie malnutrition, severe 07/19/2020   Esophageal mass 07/18/2020   Mass of esophagus    Mass of esophagus determined by endoscopy     Non-cardiac chest pain    Chest pain 07/17/2020   Pancreatic mass 07/17/2020   Acute respiratory failure with hypoxia ([place]) 07/17/2020   Unintentional weight loss 07/17/2020   B12 deficiency 07/17/2020   Iron deficiency 07/17/2020   Abnormal CT scan, esophagus    Pain in left hand 05/30/2015   Cellulitis of left hand 05/15/2015   COPD (chronic obstructive pulmonary disease) ([place]) 05/13/2015   Tobacco abuse 05/13/2015   Cat bite of left hand 05/12/2015   COPD exacerbation ([place]) 10/20/2014   Smoker 10/20/2014   Positive D dimer 10/20/2014   Chest pain, pleuritic 10/20/2014   Thrombocytopenia ([place]) 10/20/2014    Past Surgical History:  Procedure Laterality Date   APPENDECTOMY     BIOPSY  07/18/2020   Procedure: BIOPSY;  Surgeon: Mauri Pole, MD;  Location: [facility];  Service: Endoscopy;;   ESOPHAGOGASTRODUODENOSCOPY (EGD) WITH PROPOFOL N/A 07/18/2020   Procedure: ESOPHAGOGASTRODUODENOSCOPY (EGD) WITH PROPOFOL;  Surgeon: Mauri Pole, MD;  Location: [facility];  Service: Endoscopy;  Laterality: N/A;   I & D EXTREMITY Left 05/13/2015   Procedure: INCISION AND DRAINAGE OF LEFT EXTREMITY;  Surgeon: Charlotte Crumb, MD;  Location: [facility];  Service: Orthopedics;  Laterality: Left;   INCISION AND DRAINAGE Left 05/13/2015   IR GASTROSTOMY TUBE MOD SED  08/26/2020   IR IMAGING GUIDED PORT INSERTION  08/15/2020  No family history on file.  Social History   Tobacco Use   Smoking status: Every Day    Packs/day: 0.25    Years: 20.00    Pack years: 5.00    Types: Cigarettes   Smokeless tobacco: Never  Vaping Use   Vaping Use: Never used  Substance Use Topics   Alcohol use: No   Drug use: No    Home Medications Prior to Admission medications   Medication Sig Start Date End Date Taking? Authorizing Provider  acetaminophen (TYLENOL) 500 MG tablet Take 500 mg by mouth every 6 (six) hours as needed for mild pain or headache.    [provider]   dexamethasone (DECADRON) 4 MG tablet Please follow taper instructions given in clinic 09/01/20   Hayden Pedro, PA-C  dexamethasone (DECADRON) 4 MG tablet Please follow taper instructions given in clinic 09/01/20   Hayden Pedro, PA-C  food thickener (SIMPLYTHICK, NECTAR/LEVEL 2/MILDLY THICK,) GEL Take 1 packet by mouth as needed. 07/20/20   Donne Hazel, MD  lidocaine-prilocaine (EMLA) cream Apply 1 application topically as needed. 08/29/20   Orson Slick, MD  metoprolol tartrate (LOPRESSOR) 25 MG tablet TAKE 1 TABLET(25 MG) BY MOUTH TWICE DAILY Patient not taking: Reported on 08/25/2020 08/24/20   Orson Slick, MD  Nutritional Supplements (FEEDING SUPPLEMENT, OSMOLITE 1.5 CAL,) LIQD Begin one half carton Osmolite 1.5 via G-tube 4 times daily with 60 mL free water before and after bolus feeding.  Increase by one half cartons daily to goal rate of 5 cartons a day split into 4 feedings.  Drink or flush tube with additional 240 mL free water 3 times daily.  Provides 1775 cal, 74.5 g protein and 2105 mL free water.(Greater than 100% minimum calorie needs and 88% minimum protein needs) 08/29/20   Orson Slick, MD  ondansetron (ZOFRAN) 8 MG tablet Take 1 tablet (8 mg total) by mouth every 8 (eight) hours as needed. 09/28/20   Orson Slick, MD  oxyCODONE (OXY IR/ROXICODONE) 5 MG immediate release tablet Take 1 tablet (5 mg total) by mouth every 6 (six) hours as needed for moderate pain or breakthrough pain. 08/19/20   Orson Slick, MD  pantoprazole (PROTONIX) 40 MG tablet Take 1 tablet (40 mg total) by mouth daily. Patient not taking: Reported on 08/29/2020 08/02/20   Hayden Pedro, PA-C  prochlorperazine (COMPAZINE) 10 MG tablet TAKE 1 TABLET(10 MG) BY MOUTH EVERY 6 HOURS AS NEEDED FOR NAUSEA OR VOMITING 09/12/20   Orson Slick, MD  senna-docusate (SENNA S) 8.6-50 MG tablet Take 1-2 tablets daily for constipation as directed 09/12/20   Orson Slick, MD   albuterol (PROVENTIL HFA;VENTOLIN HFA) 108 (90 BASE) MCG/ACT inhaler Inhale 2 puffs into the lungs every 6 (six) hours as needed for wheezing or shortness of breath. 10/21/14 05/12/15  Velvet Bathe, MD    Allergies    Patient has no known allergies.  Review of Systems   Review of Systems  All other systems reviewed and are negative.  Physical Exam Updated Vital Signs BP (!) 152/98   Pulse 62   Temp 97.8 F (36.6 C) (Oral)   Resp 20   Ht '5\' 7"'$  (1.702 m)   Wt 46.8 kg   SpO2 94%   BMI 16.16 kg/m   Physical Exam Vitals and nursing note reviewed.  Constitutional:      General: He is not in acute distress.    Appearance:  He is well-developed. He is cachectic.  HENT:     Head: Normocephalic and atraumatic.     Nose: Rhinorrhea present.     Mouth/Throat:     Mouth: Mucous membranes are moist.  Eyes:     Conjunctiva/sclera: Conjunctivae normal.     Pupils: Pupils are equal, round, and reactive to light.  Cardiovascular:     Rate and Rhythm: Regular rhythm. Tachycardia present.     Pulses: Normal pulses.     Heart sounds: No murmur heard. Pulmonary:     Effort: Pulmonary effort is normal. No respiratory distress.     Breath sounds: Examination of the right-middle field reveals decreased breath sounds. Examination of the right-lower field reveals decreased breath sounds. Decreased breath sounds present. No wheezing or rales.     Comments: Port-a-cath in the Right upper chest currently accessed and getting flufox Abdominal:     General: Abdomen is flat. There is no distension.     Palpations: Abdomen is soft.     Tenderness: There is abdominal tenderness in the suprapubic area and left lower quadrant. There is no guarding or rebound.    Musculoskeletal:        General: No tenderness. Normal range of motion.     Cervical back: Normal range of motion and neck supple.     Right lower leg: No edema.     Left lower leg: No edema.  Skin:    General: Skin is warm.     Findings:  No erythema or rash.  Neurological:     Mental Status: He is alert and oriented to person, place, and time. Mental status is at baseline.  Psychiatric:        Mood and Affect: Mood normal.        Behavior: Behavior normal.    ED Results / Procedures / Treatments   Labs (all labs ordered are listed, but only abnormal results are displayed) Labs Reviewed  CBC WITH DIFFERENTIAL/PLATELET - Abnormal; Notable for the following components:      Result Value   WBC 2.4 (*)    Hemoglobin 12.0 (*)    HCT 36.5 (*)    RDW 20.6 (*)    Lymphs Abs 0.2 (*)    All other components within normal limits  COMPREHENSIVE METABOLIC PANEL - Abnormal; Notable for the following components:   Sodium 129 (*)    Chloride 94 (*)    Glucose, Bld 108 (*)    Total Bilirubin 1.3 (*)    All other components within normal limits    EKG None  Radiology DG Abdomen 1 View  Result Date: 09/29/2020 CLINICAL DATA:  Abdominal pain and diarrhea. EXAM: ABDOMEN - 1 VIEW COMPARISON:  None. FINDINGS: A feeding gastrostomy tube is noted. Unremarkable bowel gas pattern. No findings to suggest obstruction or perforation. The bony structures are intact. IMPRESSION: Unremarkable abdominal radiograph. Electronically Signed   By: Marijo Sanes M.D.   On: 09/29/2020 08:47   DG Chest Port 1 View  Result Date: 09/29/2020 CLINICAL DATA:  Cough and decreased breath sounds. EXAM: PORTABLE CHEST 1 VIEW COMPARISON:  07/16/2020 FINDINGS: The cardiac silhouette, mediastinal and hilar contours are stable. Mild tortuosity of the thoracic aorta with scattered calcifications. Mild hyperinflation but the lungs are clear. No pleural effusions or pulmonary lesions. No pneumothorax. Right IJ Port-A-Cath in good position. IMPRESSION: No acute cardiopulmonary findings. Electronically Signed   By: Marijo Sanes M.D.   On: 09/29/2020 08:49    Procedures Procedures   Medications  Ordered in ED Medications  oxyCODONE (Oxy IR/ROXICODONE) immediate  release tablet 5 mg (has no administration in time range)  feeding supplement (OSMOLITE 1.5 CAL) liquid 237 mL (has no administration in time range)  sodium chloride 0.9 % bolus 500 mL (500 mLs Intravenous New Bag/Given 09/29/20 [id number])    ED Course  I have reviewed the triage vital signs and the nursing notes.  Pertinent labs & imaging results that were available during my care of the patient were reviewed by me and considered in my medical decision making (see chart for details).    MDM Rules/Calculators/A&P                           Patient is a 60 year old male presenting today with multiple medical problems currently on chemotherapy infusion for esophageal cancer with mets who has significant malnutrition and currently has a G-tube in place.  Patient comes in due to significant diarrhea in the last 12 to 18 hours.  This started after getting home from his chemoinfusion yesterday.  He was having issues 4 days prior to that with infusing his Osmolite and it would not flush.  They ended up putting on a different extension and it was able to infuse easily.  However since getting home he continues to have diarrhea and some lower abdominal pain.  He denies any nausea or vomiting.  He has had pain with the cancer and is not sure if this is his typical pain.  He also was having issues with constipation and had been taking Senokot daily.  Patient is cachectic and appears weak but is able to stand from the wheelchair and sit in the bed.  He is mildly tachycardic and has decreased breath sounds on the right side but denies any shortness of breath and is satting 97% on room air with no tachypnea.  He has no significant wheezing.  We will attempt to flush the tube and give Osmolite.  Lower suspicion for obstruction at this time.  Diarrhea may be coming from chemotherapy versus having too much Senokot also may have worsened because he has not had his pain medication in the last 12 to 18 hours.  Patient does take  some medication orally.  We will give him his oral pain medication and IV fluids as he is hyponatremic today with a sodium of 129 from his baseline of 136.  LFTs and creatinine today are within normal limits.  Leukopenia with a white count of 2.4 most likely from chemotherapy but hemoglobin stable at 12 platelet count within normal limits.  We will get a chest and abdominal film.  Patient given a 10 mL/kg bolus of saline.  9:19 AM Imaging without acute findings.  Will infuse Osmolite as patient thinks the Osmolite is what is causing his diarrhea.  2:05 PM Pt tolerated feed well.  Only minimal diarrhea however unsure that feed is the cause and may be from senokot and chemo.  Pt o/w stable.  BMP has not changed and sodium is still 129.  Feel pt is stable for d/c but he will need close f/u with oncology and following sodium levels.  Pt also to stop senokot except for prn and use imodium prn.  Findings discussed with pt and he is comfortable with this plan.  MDM   Amount and/or Complexity of Data Reviewed Clinical lab tests: ordered and reviewed Tests in the radiology section of CPT: ordered and reviewed Independent visualization of images, tracings, or  specimens: yes  Patient Progress Patient progress: stable    Final Clinical Impression(s) / ED Diagnoses Final diagnoses:  Diarrhea, unspecified type  Hyponatremia    Rx / DC Orders ED Discharge Orders          Ordered    loperamide (IMODIUM) 2 MG capsule  2 times daily PRN        09/29/20 1410             Blanchie Dessert, MD 09/29/20 1411

## 2020-09-29 NOTE — ED Notes (Signed)
Family coming   Blanchie Dessert, MD 09/29/20 [phone]

## 2020-09-29 NOTE — Discharge Instructions (Addendum)
Your sodium levels were low today and will need to be rechecked within the next week.  You should continue your osmolite feeds 4 times a day to increase your calories because you had lost weight on your last visit.  Avoid the medication Senokot if you are not having constipation.  Only take as needed.  This medication and the chemo may be causing your diarrhea.  If symptoms worsen and you start passing out, having severe abdominal pain, fever or vomiting return to the ER.

## 2020-09-29 NOTE — ED Notes (Signed)
Placed a external cath changed patient brief patient is resting with csll bell in reach

## 2020-09-30 ENCOUNTER — Inpatient Hospital Stay: Payer: Medicaid Other

## 2020-09-30 ENCOUNTER — Other Ambulatory Visit: Payer: Self-pay

## 2020-09-30 VITALS — BP 106/75 | HR 91 | Temp 97.7°F | Resp 16

## 2020-09-30 DIAGNOSIS — Z5111 Encounter for antineoplastic chemotherapy: Secondary | ICD-10-CM | POA: Diagnosis not present

## 2020-09-30 DIAGNOSIS — Z95828 Presence of other vascular implants and grafts: Secondary | ICD-10-CM

## 2020-09-30 MED ORDER — SODIUM CHLORIDE 0.9% FLUSH
10.0000 mL | Freq: Once | INTRAVENOUS | Status: AC
  Administered 2020-09-30: 10 mL

## 2020-09-30 MED ORDER — HEPARIN SOD (PORK) LOCK FLUSH 100 UNIT/ML IV SOLN
500.0000 [IU] | Freq: Once | INTRAVENOUS | Status: AC
  Administered 2020-09-30: 500 [IU]

## 2020-10-07 ENCOUNTER — Encounter: Payer: Self-pay | Admitting: Hematology and Oncology

## 2020-10-12 ENCOUNTER — Inpatient Hospital Stay: Payer: Medicaid Other

## 2020-10-12 ENCOUNTER — Inpatient Hospital Stay: Payer: Medicaid Other | Admitting: Hematology and Oncology

## 2020-10-12 ENCOUNTER — Inpatient Hospital Stay: Payer: Medicaid Other | Admitting: Dietician

## 2020-10-12 ENCOUNTER — Telehealth: Payer: Self-pay | Admitting: *Deleted

## 2020-10-12 NOTE — Progress Notes (Signed)
Nutrition  Patient did not show for infusion today. Unable to complete nutrition follow-up. Will plan to see patient for follow-up 10/5 during infusion.

## 2020-10-12 NOTE — Telephone Encounter (Signed)
TCT patient as he has not shown up today for his scheduled appt.  No answer-call went straight to vm. Left message for pt to call back regarding today's appts.  Call made to pt's brother-no answert no vm.

## 2020-10-14 ENCOUNTER — Inpatient Hospital Stay: Payer: Medicaid Other

## 2020-10-22 DEATH — deceased

## 2020-10-26 ENCOUNTER — Inpatient Hospital Stay: Payer: Self-pay | Admitting: Hematology and Oncology

## 2020-10-26 ENCOUNTER — Inpatient Hospital Stay: Payer: Self-pay

## 2020-10-26 ENCOUNTER — Inpatient Hospital Stay: Payer: Self-pay | Admitting: Dietician

## 2020-10-26 ENCOUNTER — Encounter: Payer: Self-pay | Admitting: General Practice

## 2020-10-26 NOTE — Progress Notes (Signed)
[facility] CSW Progress Notes  Call from [place], concerned that patient has missed appointments, wonders about welfare check.  CSW called contacts on facesheet - per friend, patient is deceased.  MD and RN informed.  Edwyna Shell, LCSW Clinical Social Worker Phone:  [phone]

## 2020-10-28 ENCOUNTER — Inpatient Hospital Stay: Payer: Self-pay

## 2020-11-03 ENCOUNTER — Ambulatory Visit: Payer: Self-pay | Admitting: Nurse Practitioner

## 2021-01-20 ENCOUNTER — Encounter: Payer: Self-pay | Admitting: Hematology and Oncology

## 2022-01-01 ENCOUNTER — Encounter: Payer: Self-pay | Admitting: Gastroenterology

## 2022-08-21 IMAGING — XA IR PERC PLACEMENT GASTROSTOMY
2 series · 7 of 7 positions shown · non-contrast
Comparison: none

INDICATION: 60-year-old with malignant neoplasm of middle third of the
esophagus. Patient has esophageal dysphagia and needs percutaneous
gastrostomy tube for nutrition.

[Series 1: care single · 2 of 2 slices shown]
[im 1/2]
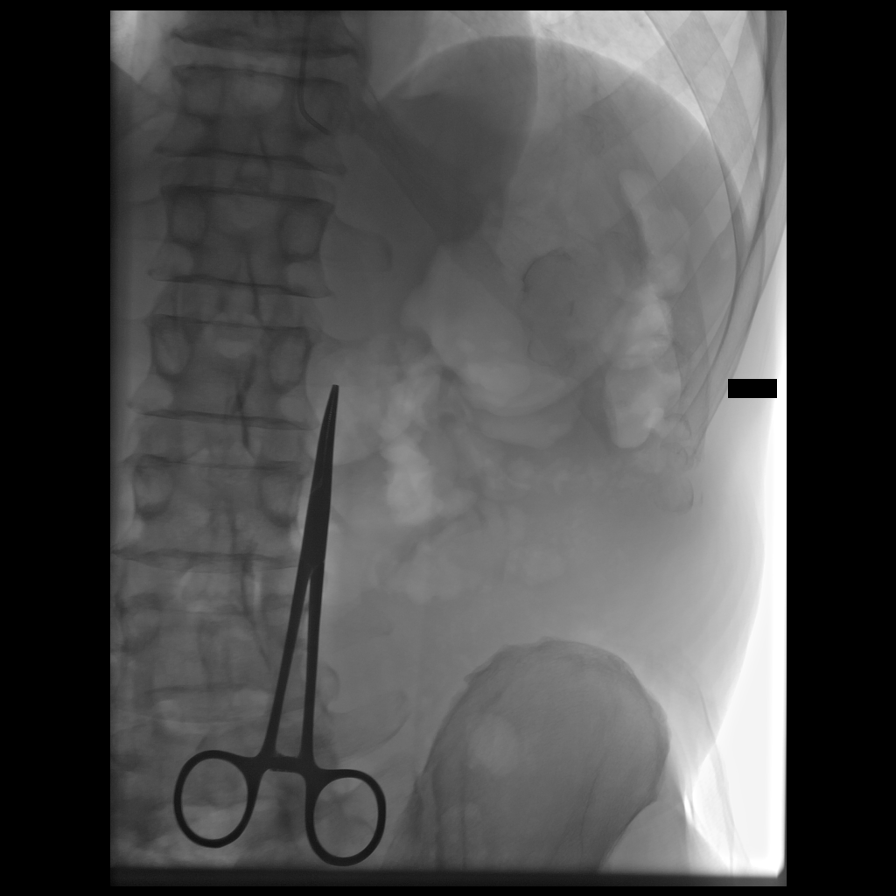
[im 2/2]
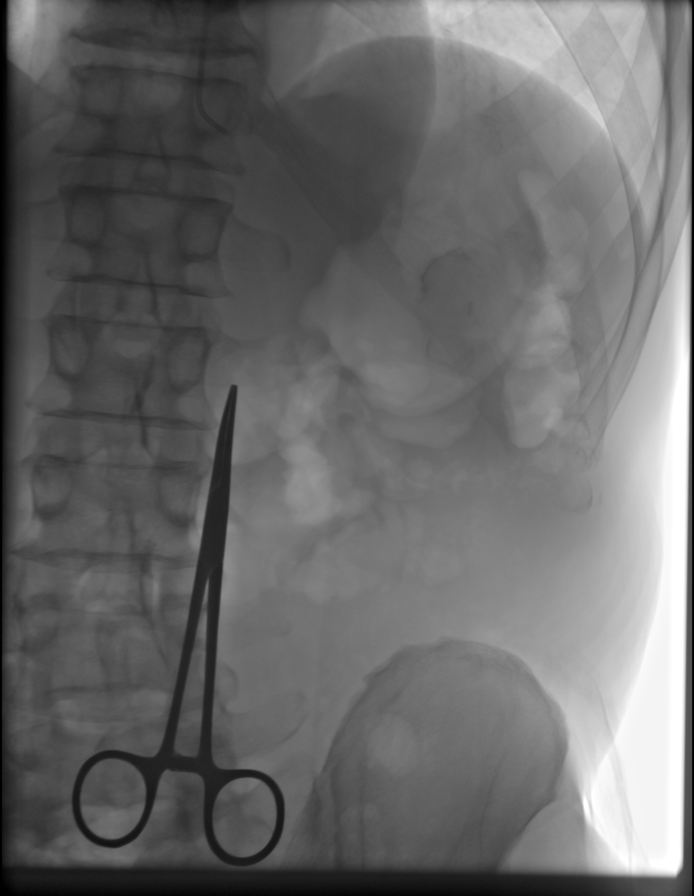

[Series 300: ir gastrostomy tube mod sed · 5 of 5 slices shown]
[im 1/5]
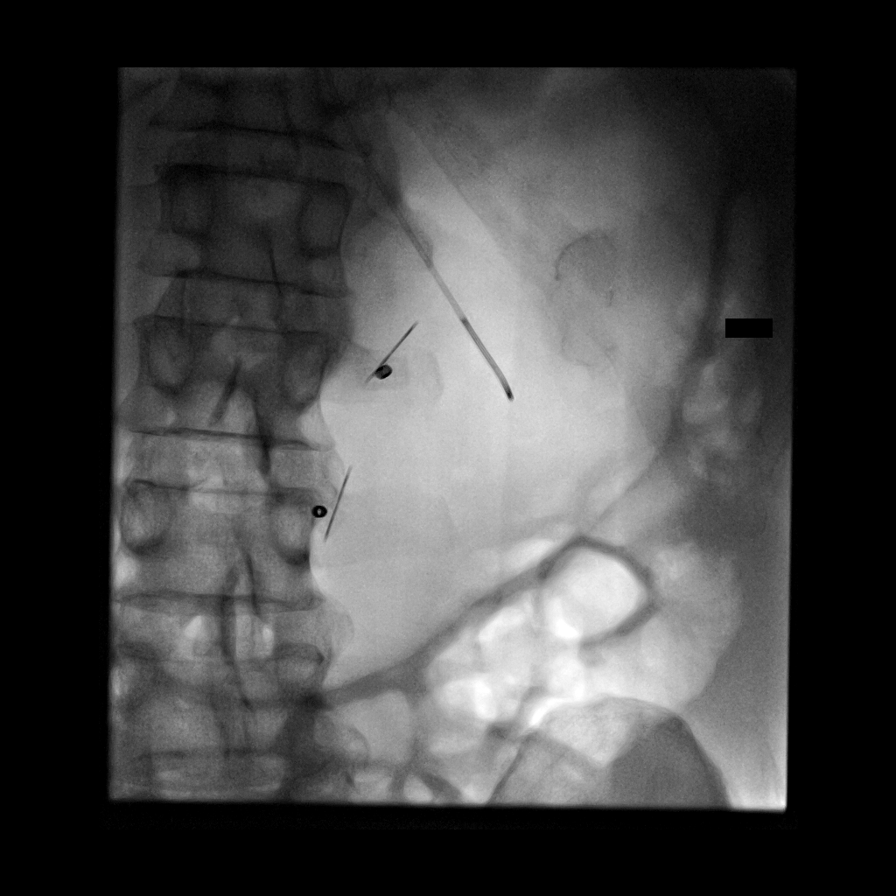
[im 2/5]
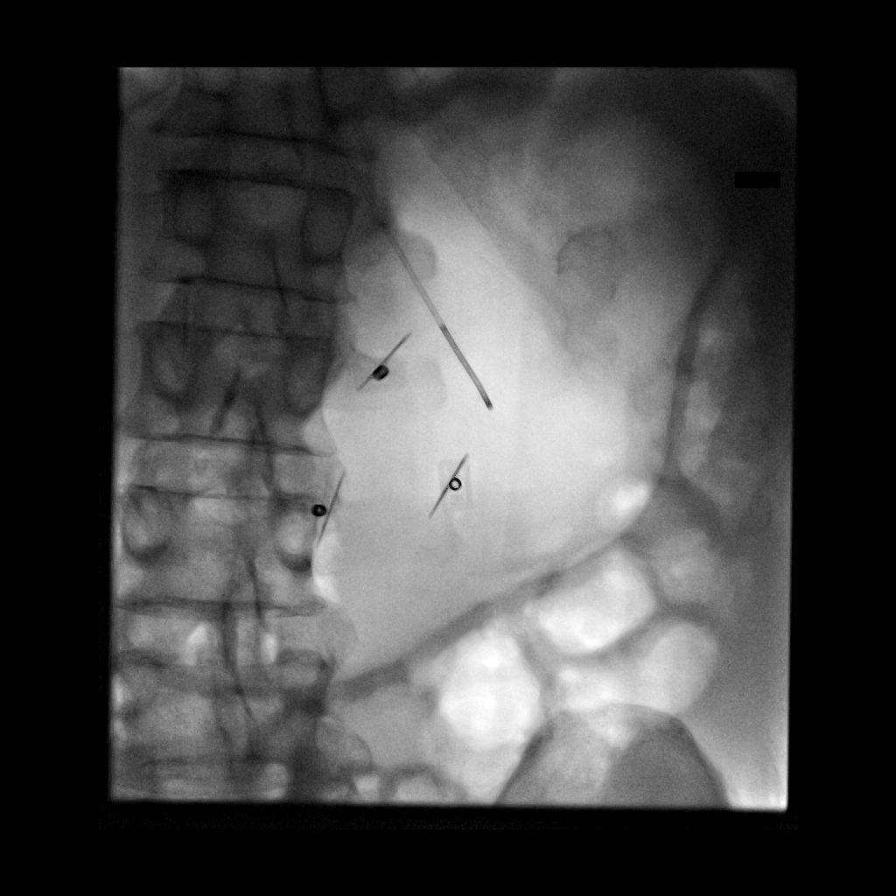
[im 3/5]
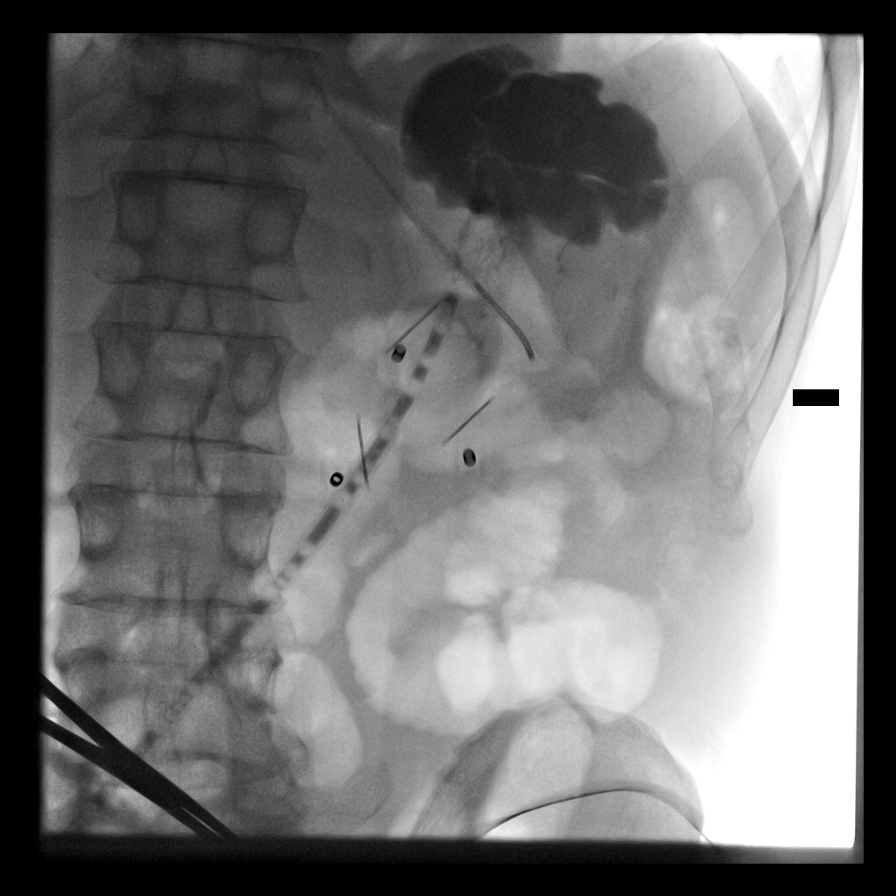
[im 4/5]
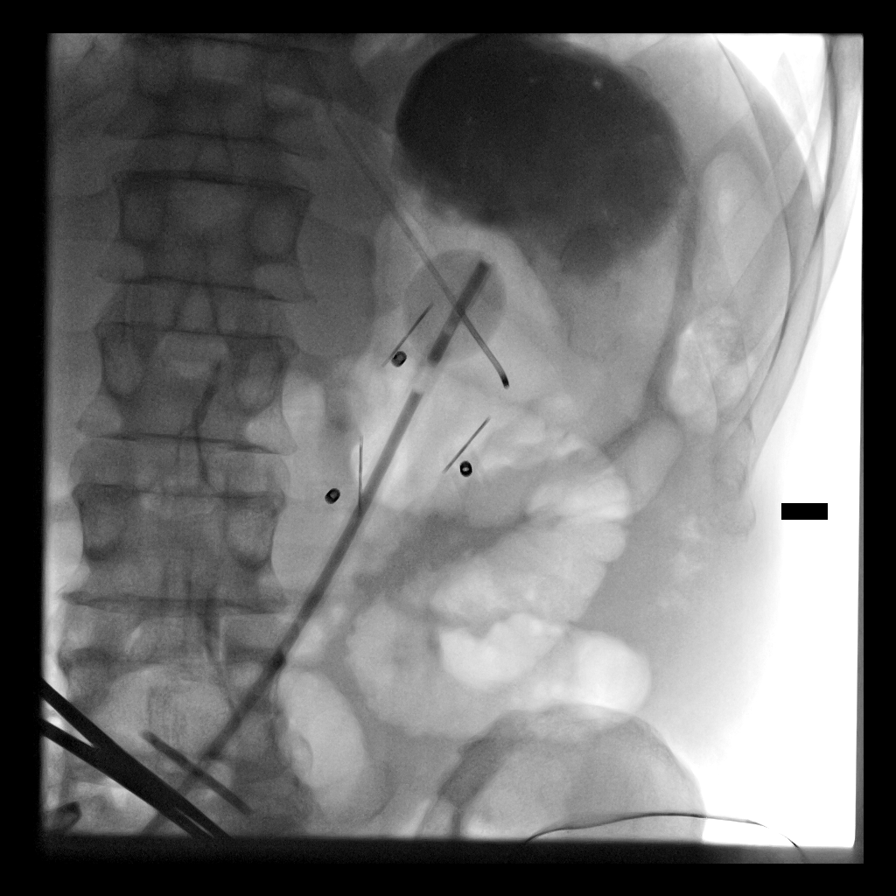
[im 5/5]
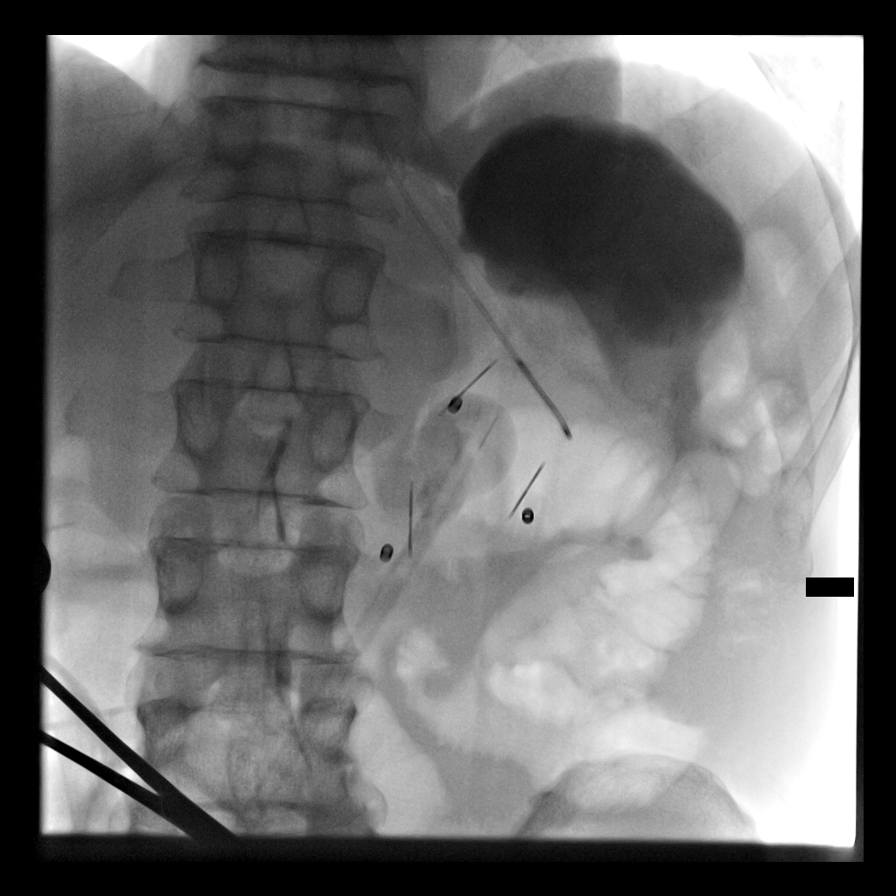

[7 of 7 positions shown; findings below may reference images not displayed]

EXAM:
PERCUTANEOUS GASTROSTOMY TUBE WITH FLUOROSCOPIC GUIDANCE

MEDICATIONS:
Ancef 2 g; Antibiotics were administered within 1 hour of the
procedure. Glucagon 0.5 mg IV

ANESTHESIA/SEDATION:
Versed 2.0 mg IV; Fentanyl 100 mcg IV

Moderate Sedation Time:  33 minutes

The patient was continuously monitored during the procedure by the
interventional radiology nurse under my direct supervision.

FLUOROSCOPY TIME:  Fluoroscopy Time: 4 minutes, 54 seconds, 18 mGy

COMPLICATIONS:
None immediate.

PROCEDURE:
The procedure was explained to the patient. The risks and benefits
of the procedure were discussed and the patient's questions were
addressed. Informed consent was obtained from the patient. The
patient was placed on the interventional table. Nasogastric tube was
placed with fluoroscopy. The anterior abdomen was prepped and draped
in sterile fashion. Maximal barrier sterile technique was utilized
including caps, mask, sterile gowns, sterile gloves, sterile drape,
hand hygiene and skin antiseptic. Stomach was inflated with air
through the nasogastric tube. The skin and subcutaneous tissues were
anesthetized with 1% lidocaine. Using fluoroscopic guidance, T
fastener was deployed in the stomach. Total of 3 T-fasteners were
placed in a triangular configuration. Small incision was made
between the T-fasteners. Needle was directed into the stomach using
fluoroscopic guidance and super stiff Amplatz wire was advanced in
the stomach. The tract was dilated to accommodate a 22 French
peel-away sheath. A 20 French Entuit gastrostomy tube was advanced
over the wire. The balloon was inflated with approximately 10 mL of
water. Contrast injection confirmed placement in the stomach.
FINDINGS: Stomach was adequately insufflated for the procedure. Three
T-fasteners were successfully placed in the stomach. 20 French
gastrostomy tube was placed between the T-fasteners. Contrast
injection confirmed placement in the stomach.
IMPRESSION: 1. Successful placement of a fluoroscopic guided balloon retention
gastrostomy tube.
2. Patient has 3 T-fasteners. Patient will be scheduled to return in
10-14 days to have the T fastener bumpers removed.
